# Patient Record
Sex: Female | Born: 1980 | Race: Asian | Hispanic: No | Marital: Single | Smoking: Former smoker
Health system: Southern US, Community
[De-identification: ages and names within clinical notes are randomized; demographics above are authoritative.]

## PROBLEM LIST (undated history)

## (undated) DIAGNOSIS — I639 Cerebral infarction, unspecified: Secondary | ICD-10-CM

## (undated) DIAGNOSIS — D582 Other hemoglobinopathies: Secondary | ICD-10-CM

## (undated) DIAGNOSIS — G43909 Migraine, unspecified, not intractable, without status migrainosus: Secondary | ICD-10-CM

## (undated) DIAGNOSIS — E785 Hyperlipidemia, unspecified: Secondary | ICD-10-CM

## (undated) DIAGNOSIS — D649 Anemia, unspecified: Secondary | ICD-10-CM

## (undated) DIAGNOSIS — I10 Essential (primary) hypertension: Secondary | ICD-10-CM

## (undated) DIAGNOSIS — G4730 Sleep apnea, unspecified: Secondary | ICD-10-CM

## (undated) HISTORY — DX: Hyperlipidemia, unspecified: E78.5

## (undated) HISTORY — DX: Essential (primary) hypertension: I10

## (undated) HISTORY — DX: Other hemoglobinopathies: D58.2

## (undated) HISTORY — DX: Morbid (severe) obesity due to excess calories: E66.01

## (undated) HISTORY — PX: WISDOM TOOTH EXTRACTION: SHX21

## (undated) HISTORY — DX: Cerebral infarction, unspecified: I63.9

## (undated) HISTORY — PX: TUBAL LIGATION: SHX77

## (undated) HISTORY — PX: CHOLECYSTECTOMY: SHX55

---

## 2004-08-12 ENCOUNTER — Emergency Department: Payer: Self-pay | Admitting: Emergency Medicine

## 2006-12-17 ENCOUNTER — Observation Stay: Payer: Self-pay | Admitting: Unknown Physician Specialty

## 2007-06-09 ENCOUNTER — Observation Stay: Payer: Self-pay

## 2007-06-28 ENCOUNTER — Observation Stay: Payer: Self-pay | Admitting: Obstetrics & Gynecology

## 2007-07-19 ENCOUNTER — Inpatient Hospital Stay: Payer: Self-pay | Admitting: Obstetrics & Gynecology

## 2008-04-23 ENCOUNTER — Emergency Department: Payer: Self-pay | Admitting: Emergency Medicine

## 2008-05-04 ENCOUNTER — Emergency Department: Payer: Self-pay | Admitting: Emergency Medicine

## 2015-10-01 ENCOUNTER — Emergency Department: Payer: BLUE CROSS/BLUE SHIELD

## 2015-10-01 ENCOUNTER — Emergency Department
Admission: EM | Admit: 2015-10-01 | Discharge: 2015-10-01 | Disposition: A | Discharge status: Home / Self Care | Payer: BLUE CROSS/BLUE SHIELD | Attending: Emergency Medicine | Admitting: Emergency Medicine

## 2015-10-01 ENCOUNTER — Encounter: Payer: Self-pay | Admitting: Emergency Medicine

## 2015-10-01 DIAGNOSIS — F17200 Nicotine dependence, unspecified, uncomplicated: Secondary | ICD-10-CM | POA: Diagnosis not present

## 2015-10-01 DIAGNOSIS — Y9289 Other specified places as the place of occurrence of the external cause: Secondary | ICD-10-CM | POA: Insufficient documentation

## 2015-10-01 DIAGNOSIS — Y998 Other external cause status: Secondary | ICD-10-CM | POA: Diagnosis not present

## 2015-10-01 DIAGNOSIS — S8991XA Unspecified injury of right lower leg, initial encounter: Secondary | ICD-10-CM | POA: Insufficient documentation

## 2015-10-01 DIAGNOSIS — M25569 Pain in unspecified knee: Secondary | ICD-10-CM

## 2015-10-01 DIAGNOSIS — Y9389 Activity, other specified: Secondary | ICD-10-CM | POA: Diagnosis not present

## 2015-10-01 DIAGNOSIS — X58XXXA Exposure to other specified factors, initial encounter: Secondary | ICD-10-CM | POA: Insufficient documentation

## 2015-10-01 MED ORDER — TRAMADOL HCL 50 MG PO TABS: 50.0000 mg | ORAL_TABLET | Freq: Four times a day (QID) | ORAL | Status: AC | PRN

## 2015-10-01 NOTE — ED Provider Notes (Signed)
[facility] Emergency Department Provider Note    ____________________________________________  Time seen: 2105  I have reviewed the triage vital signs and the nursing notes.   HISTORY  Chief Complaint Knee Pain   History limited by: Not Limited   HPI Natasha Moreno is a 35 y.o. female with no significant past medical history who presents to the emergency department today because of right knee pain. The patient states it started suddenly about 4 hours ago. She was lying in bed and turned over when she felt a pop behind her right knee. The pain has since been constant. She has not noticed any numbness or tingling down her leg. She has not been able to stand because of the pain. She denies any other injuries. Denies injuries to that knee in the past.    History reviewed. No pertinent past medical history.  There are no active problems to display for this patient.   History reviewed. No pertinent past surgical history.  No current outpatient prescriptions on file.  Allergies Review of patient's allergies indicates no known allergies.  History reviewed. No pertinent family history.  Social History Social History  Substance Use Topics  . Smoking status: Current Every Day Smoker  . Smokeless tobacco: None  . Alcohol Use: No    Review of Systems  Constitutional: Negative for fever. Cardiovascular: Negative for chest pain. Respiratory: Negative for shortness of breath. Gastrointestinal: Negative for abdominal pain, vomiting and diarrhea. Neurological: Negative for headaches, focal weakness or numbness.   10-point ROS otherwise negative.  ____________________________________________   PHYSICAL EXAM:  VITAL SIGNS:  98.9 F (37.2 C)   104  18  114/88 mmHg  99 %    Constitutional: Alert and oriented. Well appearing and in no distress. Eyes: Conjunctivae are normal. PERRL. Normal extraocular movements. ENT   Head: Normocephalic and  atraumatic.   Nose: No congestion/rhinnorhea.   Mouth/Throat: Mucous membranes are moist.   Neck: No stridor. Hematological/Lymphatic/Immunilogical: No cervical lymphadenopathy. Cardiovascular: Normal rate, regular rhythm.  No murmurs, rubs, or gallops. Respiratory: Normal respiratory effort without tachypnea nor retractions. Breath sounds are clear and equal bilaterally. No wheezes/rales/rhonchi. Gastrointestinal: Soft and nontender. No distention. There is no CVA tenderness. Genitourinary: Deferred Musculoskeletal: Slightly tender to palpation of the popliteal fossa and some pain with flexion of the knee. No gross deformity. No effusion. No skin change. Neurologic:  Normal speech and language. No gross focal neurologic deficits are appreciated.  Skin:  Skin is warm, dry and intact. No rash noted. Psychiatric: Mood and affect are normal. Speech and behavior are normal. Patient exhibits appropriate insight and judgment.  ____________________________________________    LABS (pertinent positives/negatives)  None  ____________________________________________   EKG  None  ____________________________________________    RADIOLOGY  US  IMPRESSION: Unremarkable sonogram of the right popliteal fossa.   ____________________________________________   PROCEDURES  Procedure(s) performed: None  Critical Care performed: No  ____________________________________________   INITIAL IMPRESSION / ASSESSMENT AND PLAN / ED COURSE  Pertinent labs & imaging results that were available during my care of the patient were reviewed by me and considered in my medical decision making (see chart for details).  Patient presents to the emergency department today because of concerns for right knee pain. This happened when she was in bed. At this point I doubt any osseous injury. Ultrasound did not show any concerning findings. I think likely patient suffered a soft tissue or ligament  and/or tendon sprain. Will have the nurse wrap and Ace bandage. Discussed rice  care with patient. Will give her the follow-up.  ____________________________________________   FINAL CLINICAL IMPRESSION(S) / ED DIAGNOSES  Final diagnoses:  Knee pain     Phineas Semen, MD 10/01/15 2310

## 2015-10-01 NOTE — Discharge Instructions (Signed)
Please seek medical attention for any high fevers, chest pain, shortness of breath, change in behavior, persistent vomiting, bloody stool or any other new or concerning symptoms. ° °RICE for Routine Care of Injuries °The routine care of many injuries includes rest, ice, compression, and elevation (RICE therapy). RICE therapy is often recommended for injuries to soft tissues, such as a muscle strain, ligament injuries, bruises, and overuse injuries. It can also be used for some bony injuries. Using RICE therapy can help to relieve pain, lessen swelling, and enable your body to heal. °Rest °Rest is required to allow your body to heal. This usually involves reducing your normal activities and avoiding use of the injured part of your body. Generally, you can return to your normal activities when you are comfortable and have been given permission by your health care provider. °Ice °Icing your injury helps to keep the swelling down, and it lessens pain. Do not apply ice directly to your skin. °· Put ice in a plastic bag. °· Place a towel between your skin and the bag. °· Leave the ice on for 20 minutes, 2-3 times a day. °Do this for as long as you are directed by your health care provider. °Compression °Compression means putting pressure on the injured area. Compression helps to keep swelling down, gives support, and helps with discomfort. Compression may be done with an elastic bandage. If an elastic bandage has been applied, follow these general tips: °· Remove and reapply the bandage every 3-4 hours or as directed by your health care provider. °· Make sure the bandage is not wrapped too tightly, because this can cut off circulation. If part of your body beyond the bandage becomes blue, numb, cold, swollen, or more painful, your bandage is most likely too tight. If this occurs, remove your bandage and reapply it more loosely. °· See your health care provider if the bandage seems to be making your problems worse rather  than better. °Elevation °Elevation means keeping the injured area raised. This helps to lessen swelling and decrease pain. If possible, your injured area should be elevated at or above the level of your heart or the center of your chest. °WHEN SHOULD I SEEK MEDICAL CARE? °You should seek medical care if: °· Your pain and swelling continue. °· Your symptoms are getting worse rather than improving. °These symptoms may indicate that further evaluation or further X-rays are needed. Sometimes, X-rays may not show a small broken bone (fracture) until a number of days later. Make a follow-up appointment with your health care provider. °WHEN SHOULD I SEEK IMMEDIATE MEDICAL CARE? °You should seek immediate medical care if: °· You have sudden severe pain at or below the area of your injury. °· You have redness or increased swelling around your injury. °· You have tingling or numbness at or below the area of your injury that does not improve after you remove the elastic bandage. °  °This information is not intended to replace advice given to you by your health care provider. Make sure you discuss any questions you have with your health care provider. °  °Document Released: 12/21/2000 Document Revised: 05/30/2015 Document Reviewed: 08/16/2014 °Elsevier Interactive Patient Education ©2016 [employer] °Knee Pain °Knee pain is a very common symptom and can have many causes. Knee pain often goes away when you follow your health care provider's instructions for relieving pain and discomfort at home. However, knee pain can develop into a condition that needs treatment. Some conditions may include: °· Arthritis caused by wear and tear (osteoarthritis). °·   tear (osteoarthritis).  Arthritis caused by swelling and irritation (rheumatoid arthritis or gout).  A cyst or growth in your knee.  An infection in your knee joint.  An injury that will not heal.  Damage, swelling, or irritation of the tissues that support your knee (torn ligaments or  tendinitis). If your knee pain continues, additional tests may be ordered to diagnose your condition. Tests may include X-rays or other imaging studies of your knee. You may also need to have fluid removed from your knee. Treatment for ongoing knee pain depends on the cause, but treatment may include:  Medicines to relieve pain or swelling.  Steroid injections in your knee.  Physical therapy.  Surgery. HOME CARE INSTRUCTIONS  Take medicines only as directed by your health care provider.  Rest your knee and keep it raised (elevated) while you are resting.  Do not do things that cause or worsen pain.  Avoid high-impact activities or exercises, such as running, jumping rope, or doing jumping jacks.  Apply ice to the knee area:  Put ice in a plastic bag.  Place a towel between your skin and the bag.  Leave the ice on for 20 minutes, 2-3 times a day.  Ask your health care provider if you should wear an elastic knee support.  Keep a pillow under your knee when you sleep.  Lose weight if you are overweight. Extra weight can put pressure on your knee.  Do not use any tobacco products, including cigarettes, chewing tobacco, or electronic cigarettes. If you need help quitting, ask your health care provider. Smoking may slow the healing of any bone and joint problems that you may have. SEEK MEDICAL CARE IF:  Your knee pain continues, changes, or gets worse.  You have a fever along with knee pain.  Your knee buckles or locks up.  Your knee becomes more swollen. SEEK IMMEDIATE MEDICAL CARE IF:   Your knee joint feels hot to the touch.  You have chest pain or trouble breathing.   This information is not intended to replace advice given to you by your health care provider. Make sure you discuss any questions you have with your health care provider.   Document Released: 07/06/2007 Document Revised: 09/29/2014 Document Reviewed: 04/24/2014 Elsevier Interactive Patient Education  [employer]

## 2015-10-01 NOTE — ED Notes (Signed)
Assessment per MD

## 2015-10-01 NOTE — ED Notes (Signed)
Pt to ed with c/o right knee pain after bending over in bed.  Pt states she felt a pop behind right knee and then increased pain and difficulty walking.

## 2015-10-05 DIAGNOSIS — S83003A Unspecified subluxation of unspecified patella, initial encounter: Secondary | ICD-10-CM | POA: Insufficient documentation

## 2016-01-05 DIAGNOSIS — L739 Follicular disorder, unspecified: Secondary | ICD-10-CM | POA: Diagnosis not present

## 2016-01-08 DIAGNOSIS — L298 Other pruritus: Secondary | ICD-10-CM | POA: Diagnosis not present

## 2017-03-10 ENCOUNTER — Encounter: Payer: Self-pay | Admitting: Emergency Medicine

## 2017-03-10 ENCOUNTER — Emergency Department
Admission: EM | Admit: 2017-03-10 | Discharge: 2017-03-10 | Disposition: A | Discharge status: Home / Self Care | Payer: BLUE CROSS/BLUE SHIELD | Attending: Emergency Medicine | Admitting: Emergency Medicine

## 2017-03-10 DIAGNOSIS — Y999 Unspecified external cause status: Secondary | ICD-10-CM | POA: Insufficient documentation

## 2017-03-10 DIAGNOSIS — S39012A Strain of muscle, fascia and tendon of lower back, initial encounter: Secondary | ICD-10-CM | POA: Insufficient documentation

## 2017-03-10 DIAGNOSIS — Y929 Unspecified place or not applicable: Secondary | ICD-10-CM | POA: Insufficient documentation

## 2017-03-10 DIAGNOSIS — Y9389 Activity, other specified: Secondary | ICD-10-CM | POA: Insufficient documentation

## 2017-03-10 DIAGNOSIS — M5416 Radiculopathy, lumbar region: Secondary | ICD-10-CM | POA: Insufficient documentation

## 2017-03-10 DIAGNOSIS — F17210 Nicotine dependence, cigarettes, uncomplicated: Secondary | ICD-10-CM | POA: Insufficient documentation

## 2017-03-10 DIAGNOSIS — X501XXA Overexertion from prolonged static or awkward postures, initial encounter: Secondary | ICD-10-CM | POA: Insufficient documentation

## 2017-03-10 MED ORDER — DIAZEPAM 2 MG PO TABS: 2.0000 mg | ORAL_TABLET | Freq: Four times a day (QID) | ORAL | 0 refills | Status: DC | PRN

## 2017-03-10 MED ORDER — LIDOCAINE 5 % EX PTCH
1.0000 | MEDICATED_PATCH | CUTANEOUS | Status: DC
  Administered 2017-03-10: 1 via TRANSDERMAL
  Filled 2017-03-10: qty 1

## 2017-03-10 MED ORDER — KETOROLAC TROMETHAMINE 30 MG/ML IJ SOLN
30.0000 mg | Freq: Once | INTRAMUSCULAR | Status: AC
  Administered 2017-03-10: 30 mg via INTRAVENOUS
  Filled 2017-03-10: qty 1

## 2017-03-10 MED ORDER — TRAMADOL HCL 50 MG PO TABS: 50.0000 mg | ORAL_TABLET | Freq: Four times a day (QID) | ORAL | 0 refills | Status: DC | PRN

## 2017-03-10 MED ORDER — TRAMADOL HCL 50 MG PO TABS
50.0000 mg | ORAL_TABLET | Freq: Once | ORAL | Status: AC
  Administered 2017-03-10: 50 mg via ORAL
  Filled 2017-03-10: qty 1

## 2017-03-10 MED ORDER — LIDOCAINE 5 % EX PTCH: 1.0000 | MEDICATED_PATCH | Freq: Two times a day (BID) | CUTANEOUS | 0 refills | Status: DC

## 2017-03-10 NOTE — Discharge Instructions (Signed)
Please follow up with the acute care clinic. Please take her medication.

## 2017-03-10 NOTE — ED Notes (Signed)
Pt resting in bed, on cell phone, resp even and unlabored, pt in no acute distress

## 2017-03-10 NOTE — ED Provider Notes (Signed)
[facility] Emergency Department Provider Note   ____________________________________________   First MD Initiated Contact with Patient 03/10/17 [phone]     (approximate)  I have reviewed the triage vital signs and the nursing notes.   HISTORY  Chief Complaint Back Pain    HPI Braulio BoschDorothy Lopiccolo is a 36 y.o. female who comes into the hospital today with some back pain. The patient reports that she's been sick with a cough and upper respiratory infection for the past couple of days. The patient has stayed in bed. Reports that when she finally got out of bed she was having some very severe pain to her low back. She reports that if she sits too long or stands for too long her back is painful. The patient has been taking Tylenol for her symptoms. She reports that currently her pain is a 4 out of 10 in intensity. She denies any difficulty urinating or having bowel movements. She has had some mild back pain in the past but reports it has never been this severe. The patient denies any pain with urination. She states that she does have pain to raise her legs. The patient is here today for evaluation. She reports that the pain is across her entire back.   History reviewed. No pertinent past medical history.  There are no active problems to display for this patient.   Past Surgical History:  Procedure Laterality Date  . CHOLECYSTECTOMY    . TUBAL LIGATION    . WISDOM TOOTH EXTRACTION      Prior to Admission medications   Not on File    Allergies Patient has no known allergies.  No family history on file.  Social History Social History  Substance Use Topics  . Smoking status: Current Every Day Smoker    Types: Cigarettes  . Smokeless tobacco: Not on file  . Alcohol use No    Review of Systems  Constitutional: No fever/chills Eyes: No visual changes. ENT: No sore throat. Cardiovascular: Denies chest pain. Respiratory: Denies shortness of  breath. Gastrointestinal: No abdominal pain.  No nausea, no vomiting.  No diarrhea.  No constipation. Genitourinary: Negative for dysuria. Musculoskeletal:  back pain. Skin: Negative for rash. Neurological: Negative for headaches, focal weakness or numbness.   ____________________________________________   PHYSICAL EXAM:  VITAL SIGNS: ED Triage Vitals [03/10/17 0352]  Enc Vitals Group     BP 120/79     Pulse Rate 91     Resp 18     Temp 98.1 F (36.7 C)     Temp Source Oral     SpO2 99 %     Weight 212 lb (96.2 kg)     Height 5' (1.524 m)     Head Circumference      Peak Flow      Pain Score 8     Pain Loc      Pain Edu?      Excl. in GC?     Constitutional: Alert and oriented. Well appearing and in mild distress. Eyes: Conjunctivae are normal. PERRL. EOMI. Head: Atraumatic. Nose: No congestion/rhinnorhea. Mouth/Throat: Mucous membranes are moist.  Oropharynx non-erythematous. Cardiovascular: Normal rate, regular rhythm. Grossly normal heart sounds.  Good peripheral circulation. Respiratory: Normal respiratory effort.  No retractions. Lungs CTAB. Gastrointestinal: Soft and nontender. No distention. Positive bowel sounds Musculoskeletal: Positive straight leg raise bilaterally with some mild tenderness to palpation of the low were back and sacral area. No saddle anesthesia. Neurologic:  Normal speech and language.  Skin:  Skin is warm, dry and intact.  Psychiatric: Mood and affect are normal.   ____________________________________________   LABS (all labs ordered are listed, but only abnormal results are displayed)  Labs Reviewed - No data to display ____________________________________________  EKG  none ____________________________________________  RADIOLOGY  No results found.  ____________________________________________   PROCEDURES  Procedure(s) performed: None  Procedures  Critical Care performed:  No  ____________________________________________   INITIAL IMPRESSION / ASSESSMENT AND PLAN / ED COURSE  Pertinent labs & imaging results that were available during my care of the patient were reviewed by me and considered in my medical decision making (see chart for details).  This is a 36 year old female who comes into the hospital today with some back pain. She's been sick for a few days and stayed in bed. The patient has not had any falls. I did give the patient a shot of Toradol, a Lidoderm patch and some tramadol. I will not do any imaging as the patient has no history of trauma. I will have the patient follow-up with the acute care clinic for further evaluation of her back pain and possible referral to physical therapy. The patient will be discharged.      ____________________________________________   FINAL CLINICAL IMPRESSION(S) / ED DIAGNOSES  Final diagnoses:  Strain of lumbar region, initial encounter  Lumbar radiculopathy      NEW MEDICATIONS STARTED DURING THIS VISIT:  New Prescriptions   No medications on file     Note:  This document was prepared using Dragon voice recognition software and may include unintentional dictation errors.    Rebecka Apley, MD 03/10/17 [phone]

## 2017-03-10 NOTE — ED Triage Notes (Signed)
Pt presents to ED with lower back pain for the past 2 days. Pain increases with movement and ambulation. Pt states she has had an upper resp. Infection for the past weeks. Pt states she has been coughing frequently.

## 2017-11-09 ENCOUNTER — Encounter: Payer: Self-pay | Admitting: Intensive Care

## 2017-11-09 ENCOUNTER — Emergency Department: Payer: BLUE CROSS/BLUE SHIELD

## 2017-11-09 ENCOUNTER — Other Ambulatory Visit: Payer: Self-pay

## 2017-11-09 ENCOUNTER — Observation Stay
Admission: EM | Admit: 2017-11-09 | Discharge: 2017-11-11 | Disposition: A | Discharge status: Home / Self Care | Payer: BLUE CROSS/BLUE SHIELD | Attending: Family Medicine | Admitting: Family Medicine

## 2017-11-09 DIAGNOSIS — Z6841 Body Mass Index (BMI) 40.0 and over, adult: Secondary | ICD-10-CM | POA: Diagnosis not present

## 2017-11-09 DIAGNOSIS — G459 Transient cerebral ischemic attack, unspecified: Secondary | ICD-10-CM | POA: Insufficient documentation

## 2017-11-09 DIAGNOSIS — R29818 Other symptoms and signs involving the nervous system: Secondary | ICD-10-CM | POA: Diagnosis not present

## 2017-11-09 DIAGNOSIS — I639 Cerebral infarction, unspecified: Secondary | ICD-10-CM | POA: Diagnosis not present

## 2017-11-09 DIAGNOSIS — R202 Paresthesia of skin: Secondary | ICD-10-CM | POA: Diagnosis not present

## 2017-11-09 DIAGNOSIS — Z720 Tobacco use: Secondary | ICD-10-CM | POA: Diagnosis not present

## 2017-11-09 DIAGNOSIS — R200 Anesthesia of skin: Principal | ICD-10-CM | POA: Diagnosis present

## 2017-11-09 DIAGNOSIS — E669 Obesity, unspecified: Secondary | ICD-10-CM | POA: Insufficient documentation

## 2017-11-09 DIAGNOSIS — F17210 Nicotine dependence, cigarettes, uncomplicated: Secondary | ICD-10-CM | POA: Diagnosis not present

## 2017-11-09 DIAGNOSIS — R2689 Other abnormalities of gait and mobility: Secondary | ICD-10-CM | POA: Diagnosis not present

## 2017-11-09 LAB — COMPREHENSIVE METABOLIC PANEL
ALT: 33 U/L (ref 14–54)
AST: 28 U/L (ref 15–41)
Albumin: 4.1 g/dL (ref 3.5–5.0)
Alkaline Phosphatase: 54 U/L (ref 38–126)
Anion gap: 9 (ref 5–15)
BUN: 13 mg/dL (ref 6–20)
CHLORIDE: 105 mmol/L (ref 101–111)
CO2: 22 mmol/L (ref 22–32)
CREATININE: 0.76 mg/dL (ref 0.44–1.00)
Calcium: 8.8 mg/dL — ABNORMAL LOW (ref 8.9–10.3)
GFR calc Af Amer: >60 mL/min (ref >60)
Glucose, Bld: 108 mg/dL — ABNORMAL HIGH (ref 65–99)
Potassium: 3.4 mmol/L — ABNORMAL LOW (ref 3.5–5.1)
Sodium: 136 mmol/L (ref 135–145)
Total Bilirubin: 0.9 mg/dL (ref 0.3–1.2)
Total Protein: 7.4 g/dL (ref 6.5–8.1)

## 2017-11-09 LAB — DIFFERENTIAL
BASOS ABS: 0.1 10*3/uL (ref 0–0.1)
BASOS PCT: 1 %
Eosinophils Absolute: 0.2 10*3/uL (ref 0–0.7)
Eosinophils Relative: 2 %
LYMPHS ABS: 2.7 10*3/uL (ref 1.0–3.6)
LYMPHS PCT: 26 %
MONOS PCT: 5 %
Monocytes Absolute: 0.5 10*3/uL (ref 0.2–0.9)
NEUTROS ABS: 6.9 10*3/uL — AB (ref 1.4–6.5)
Neutrophils Relative %: 66 %

## 2017-11-09 LAB — APTT: APTT: 26 s (ref 24–36)

## 2017-11-09 LAB — CBC
HEMATOCRIT: 35 % (ref 35.0–47.0)
HEMOGLOBIN: 10.8 g/dL — AB (ref 12.0–16.0)
MCH: 16 pg — ABNORMAL LOW (ref 26.0–34.0)
MCHC: 30.8 g/dL — ABNORMAL LOW (ref 32.0–36.0)
MCV: 52 fL — ABNORMAL LOW (ref 80.0–100.0)
Platelets: 379 10*3/uL (ref 150–440)
RBC: 6.72 MIL/uL — ABNORMAL HIGH (ref 3.80–5.20)
RDW: 17.6 % — AB (ref 11.5–14.5)
WBC: 10.5 10*3/uL (ref 3.6–11.0)

## 2017-11-09 LAB — GLUCOSE, CAPILLARY: Glucose-Capillary: 115 mg/dL — ABNORMAL HIGH (ref 65–99)

## 2017-11-09 LAB — TROPONIN I

## 2017-11-09 LAB — PROTIME-INR
INR: 1
Prothrombin Time: 13.1 seconds (ref 11.4–15.2)

## 2017-11-09 MED ORDER — ENOXAPARIN SODIUM 40 MG/0.4ML ~~LOC~~ SOLN
40.0000 mg | SUBCUTANEOUS | Status: DC
  Administered 2017-11-10 – 2017-11-11 (×2): 40 mg via SUBCUTANEOUS
  Filled 2017-11-09 (×2): qty 0.4

## 2017-11-09 MED ORDER — ACETAMINOPHEN 160 MG/5ML PO SOLN
650.0000 mg | ORAL | Status: DC | PRN
  Filled 2017-11-09: qty 20.3

## 2017-11-09 MED ORDER — STROKE: EARLY STAGES OF RECOVERY BOOK
Freq: Once | Status: AC
  Administered 2017-11-10: 02:00:00

## 2017-11-09 MED ORDER — ACETAMINOPHEN 650 MG RE SUPP: 650.0000 mg | RECTAL | Status: DC | PRN

## 2017-11-09 MED ORDER — ACETAMINOPHEN 325 MG PO TABS: 650.0000 mg | ORAL_TABLET | ORAL | Status: DC | PRN

## 2017-11-09 MED ORDER — ENOXAPARIN SODIUM 40 MG/0.4ML ~~LOC~~ SOLN
SUBCUTANEOUS | Status: AC
  Filled 2017-11-09: qty 0.4

## 2017-11-09 MED ORDER — ASPIRIN EC 325 MG PO TBEC
DELAYED_RELEASE_TABLET | ORAL | Status: AC
  Filled 2017-11-09: qty 1

## 2017-11-09 MED ORDER — IOPAMIDOL (ISOVUE-370) INJECTION 76%
75.0000 mL | Freq: Once | INTRAVENOUS | Status: AC | PRN
  Administered 2017-11-09: 75 mL via INTRAVENOUS

## 2017-11-09 MED ORDER — ASPIRIN 325 MG PO TABS
325.0000 mg | ORAL_TABLET | Freq: Every day | ORAL | Status: DC
  Administered 2017-11-09 – 2017-11-10 (×2): 325 mg via ORAL
  Filled 2017-11-09: qty 1

## 2017-11-09 MED ORDER — ENOXAPARIN SODIUM 40 MG/0.4ML ~~LOC~~ SOLN
30.0000 mg | SUBCUTANEOUS | Status: DC
  Administered 2017-11-09: 30 mg via SUBCUTANEOUS

## 2017-11-09 NOTE — ED Provider Notes (Addendum)
[facility] Emergency Department Provider Note  \ ____________________________________________   First MD Initiated Contact with Patient 11/09/17 1308     (approximate)  I have reviewed the triage vital signs and the nursing notes.   HISTORY  Chief Complaint Numbness    HPI Natasha Moreno is a 37 y.o. female Who reports numbness on the left side of her body hands and face arms legs etc. starting about 11:00 coming and going and went away when she went to CAT scan is coming back now she has some numbness tingling in the hand there is no motor weakness or incoordination.not had this before. Head CT was negative CT angiogram was negative too. Discussed with Dr. Thad Ranger neurology she feels that the patient still tingling she should be admitted and worked up for stroke. When I see the patient she is having return of the tingling which had gone away and CT scan starting in the hand and working its way out.   History reviewed. No pertinent past medical history.  There are no active problems to display for this patient.   Past Surgical History:  Procedure Laterality Date  . CHOLECYSTECTOMY    . TUBAL LIGATION    . WISDOM TOOTH EXTRACTION      Prior to Admission medications   Medication Sig Start Date End Date Taking? Authorizing Provider  diazepam (VALIUM) 2 MG tablet Take 1 tablet (2 mg total) by mouth every 6 (six) hours as needed for anxiety. Patient not taking: Reported on 11/09/2017 03/10/17   Rebecka Apley, MD  lidocaine (LIDODERM) 5 % Place 1 patch onto the skin every 12 (twelve) hours. Remove & Discard patch within 12 hours or as directed by MD Patient not taking: Reported on 11/09/2017 03/10/17 03/10/18  Rebecka Apley, MD  traMADol (ULTRAM) 50 MG tablet Take 1 tablet (50 mg total) by mouth every 6 (six) hours as needed. Patient not taking: Reported on 11/09/2017 03/10/17   Rebecka Apley, MD    Allergies Patient has no known  allergies.  History reviewed. No pertinent family history.  Social History Social History   Tobacco Use  . Smoking status: Current Every Day Smoker    Types: Cigarettes  . Smokeless tobacco: Never Used  Substance Use Topics  . Alcohol use: No  . Drug use: No    Review of Systems  Constitutional: No fever/chills Eyes: No visual changes. ENT: No sore throat. Cardiovascular: Denies chest pain. Respiratory: Denies shortness of breath. Gastrointestinal: No abdominal pain.  No nausea, no vomiting.  No diarrhea.  No constipation. Genitourinary: Negative for dysuria. Musculoskeletal: Negative for back pain. Skin: Negative for rash. Neurological: Negative for headaches, focal weakness   ____________________________________________   PHYSICAL EXAM:  VITAL SIGNS: ED Triage Vitals  Enc Vitals Group     BP 11/09/17 1244 (!) 125/92     Pulse Rate 11/09/17 1244 99     Resp 11/09/17 1244 16     Temp 11/09/17 1244 98.9 F (37.2 C)     Temp Source 11/09/17 1244 Oral     SpO2 11/09/17 1244 99 %     Weight 11/09/17 1246 212 lb (96.2 kg)     Height --      Head Circumference --      Peak Flow --      Pain Score --      Pain Loc --      Pain Edu? --      Excl. in GC? --  Constitutional: Alert and oriented. Well appearing and in no acute distress. Eyes: Conjunctivae are normal. PER. EOMI. Head: Atraumatic. Nose: No congestion/rhinnorhea. Mouth/Throat: Mucous membranes are moist.  Oropharynx non-erythematous. Neck: No stridor.  Cardiovascular: Normal rate, regular rhythm. Grossly normal heart sounds.  Good peripheral circulation. Respiratory: Normal respiratory effort.  No retractions. Lungs CTAB. Gastrointestinal: Soft and nontender. No distention. No abdominal bruits. No CVA tenderness. Musculoskeletal: No lower extremity tenderness nor edema.  Neurologic:  Normal speech and language. cranial nerves II through XII are intact. Visual fields were not checked cerebellar  finger-nose is normal motor strength is 5 over 5 throughout patient is having some numbness returning starting in the left hand. Skin:  Skin is warm, dry and intact. No rash noted. Psychiatric: Mood and affect are normal. Speech and behavior are normal.  ____________________________________________   LABS (all labs ordered are listed, but only abnormal results are displayed)  Labs Reviewed  CBC - Abnormal; Notable for the following components:      Result Value   RBC 6.72 (*)    Hemoglobin 10.8 (*)    MCV 52.0 (*)    MCH 16.0 (*)    MCHC 30.8 (*)    RDW 17.6 (*)    All other components within normal limits  DIFFERENTIAL - Abnormal; Notable for the following components:   Neutro Abs 6.9 (*)    All other components within normal limits  COMPREHENSIVE METABOLIC PANEL - Abnormal; Notable for the following components:   Potassium 3.4 (*)    Glucose, Bld 108 (*)    Calcium 8.8 (*)    All other components within normal limits  GLUCOSE, CAPILLARY - Abnormal; Notable for the following components:   Glucose-Capillary 115 (*)    All other components within normal limits  PROTIME-INR  APTT  TROPONIN I  CBG MONITORING, ED  POC URINE PREG, ED   ____________________________________________  EKG  EKG read and interpreted by me shows normal sinus rhythm rate of 89 normal axis no acute ST-T wave changes ____________________________________________  RADIOLOGY  ED MD interpretation:  CT of the head and angiogram of head and neck show no acute changes  Official radiology report(s): Ct Angio Head W Or Wo Contrast  Result Date: 11/09/2017 CLINICAL DATA:  37 year old female with code stroke presentation. Symptoms after exercising at the gym. Sudden onset left arm numbness EXAM: CT ANGIOGRAPHY HEAD AND NECK TECHNIQUE: Multidetector CT imaging of the head and neck was performed using the standard protocol during bolus administration of intravenous contrast. Multiplanar CT image  reconstructions and MIPs were obtained to evaluate the vascular anatomy. Carotid stenosis measurements (when applicable) are obtained utilizing NASCET criteria, using the distal internal carotid diameter as the denominator. CONTRAST:  75mL ISOVUE-370 IOPAMIDOL (ISOVUE-370) INJECTION 76% COMPARISON:  Noncontrast head CT 1244 hr today. FINDINGS: CTA NECK Skeleton: Nonspecific reversal of cervical lordosis. No acute osseous abnormality identified. Paranasal sinuses and mastoids are clear. Upper chest: Negative visualized upper chest. Normal superior mediastinum. Other neck: Negative.  No cervical lymphadenopathy. Aortic arch: 3 vessel arch configuration. No arch atherosclerosis or great vessel origin stenosis. Right carotid system: Negative. Left carotid system: Negative. Vertebral arteries: Normal proximal right subclavian artery and right vertebral artery origin. The right vertebral is normal to the skull base. Normal proximal left subclavian artery and left vertebral artery origin. The vertebral arteries are codominant in the neck, and the left is normal to the skull base. CTA HEAD Posterior circulation: Normal distal vertebral arteries and vertebrobasilar junction. The a ICAs appear  dominant. No basilar stenosis. SCA and PCA origins are normal. The right posterior communicating artery is present while the left is diminutive or absent. Bilateral PCA branches are within normal limits. Anterior circulation: Both ICA siphons are patent with no atherosclerosis or stenosis identified. Ophthalmic artery and right posterior communicating artery origins are normal. Patent carotid termini. Normal MCA and ACA origins. Diminutive or absent anterior communicating artery. Bilateral ACA is are within normal limits. Left MCA M1 segment, bifurcation, and left MCA branches are within normal limits. Right MCA M1 segment, bifurcation, and right MCA branches are within normal limits. Venous sinuses: Patent. Anatomic variants: None.  Delayed phase: No abnormal enhancement identified. Stable gray-white matter differentiation throughout the brain. Review of the MIP images confirms the above findings IMPRESSION: 1. Negative CTA head and neck. No large vessel occlusion. No atherosclerosis or stenosis identified. 2. Stable and negative CT appearance of the brain. Electronically Signed   By: Odessa FlemingH  Hall M.D.   On: 11/09/2017 13:53   Ct Angio Neck W Or Wo Contrast  Result Date: 11/09/2017 CLINICAL DATA:  37 year old female with code stroke presentation. Symptoms after exercising at the gym. Sudden onset left arm numbness EXAM: CT ANGIOGRAPHY HEAD AND NECK TECHNIQUE: Multidetector CT imaging of the head and neck was performed using the standard protocol during bolus administration of intravenous contrast. Multiplanar CT image reconstructions and MIPs were obtained to evaluate the vascular anatomy. Carotid stenosis measurements (when applicable) are obtained utilizing NASCET criteria, using the distal internal carotid diameter as the denominator. CONTRAST:  75mL ISOVUE-370 IOPAMIDOL (ISOVUE-370) INJECTION 76% COMPARISON:  Noncontrast head CT 1244 hr today. FINDINGS: CTA NECK Skeleton: Nonspecific reversal of cervical lordosis. No acute osseous abnormality identified. Paranasal sinuses and mastoids are clear. Upper chest: Negative visualized upper chest. Normal superior mediastinum. Other neck: Negative.  No cervical lymphadenopathy. Aortic arch: 3 vessel arch configuration. No arch atherosclerosis or great vessel origin stenosis. Right carotid system: Negative. Left carotid system: Negative. Vertebral arteries: Normal proximal right subclavian artery and right vertebral artery origin. The right vertebral is normal to the skull base. Normal proximal left subclavian artery and left vertebral artery origin. The vertebral arteries are codominant in the neck, and the left is normal to the skull base. CTA HEAD Posterior circulation: Normal distal vertebral  arteries and vertebrobasilar junction. The a ICAs appear dominant. No basilar stenosis. SCA and PCA origins are normal. The right posterior communicating artery is present while the left is diminutive or absent. Bilateral PCA branches are within normal limits. Anterior circulation: Both ICA siphons are patent with no atherosclerosis or stenosis identified. Ophthalmic artery and right posterior communicating artery origins are normal. Patent carotid termini. Normal MCA and ACA origins. Diminutive or absent anterior communicating artery. Bilateral ACA is are within normal limits. Left MCA M1 segment, bifurcation, and left MCA branches are within normal limits. Right MCA M1 segment, bifurcation, and right MCA branches are within normal limits. Venous sinuses: Patent. Anatomic variants: None. Delayed phase: No abnormal enhancement identified. Stable gray-white matter differentiation throughout the brain. Review of the MIP images confirms the above findings IMPRESSION: 1. Negative CTA head and neck. No large vessel occlusion. No atherosclerosis or stenosis identified. 2. Stable and negative CT appearance of the brain. Electronically Signed   By: Odessa FlemingH  Hall M.D.   On: 11/09/2017 13:53   Ct Head Code Stroke Wo Contrast  Result Date: 11/09/2017 CLINICAL DATA:  Code stroke. Paresthesia. Numbness and tingling left side EXAM: CT HEAD WITHOUT CONTRAST TECHNIQUE: Contiguous axial images were  obtained from the base of the skull through the vertex without intravenous contrast. COMPARISON:  None. FINDINGS: Brain: No evidence of acute infarction, hemorrhage, hydrocephalus, extra-axial collection or mass lesion/mass effect. Vascular: Negative for vascular thrombosis Skull: Negative Sinuses/Orbits: Negative Other: None ASPECTS (Alberta Stroke Program Early CT Score) - Ganglionic level infarction (caudate, lentiform nuclei, internal capsule, insula, M1-M3 cortex): 7 - Supraganglionic infarction (M4-M6 cortex): 3 Total score (0-10 with  10 being normal): 10 IMPRESSION: 1. Negative CT head 2. ASPECTS is 10 3. These results were called by telephone at the time of interpretation on 11/09/2017 at 12:58 pm to Dr. Mayford Knife, who verbally acknowledged these results. Electronically Signed   By: Marlan Palau M.D.   On: 11/09/2017 12:58    ____________________________________________   PROCEDURES  Procedure(s) performed:   Procedures  Critical Care performed:   ____________________________________________   INITIAL IMPRESSION / ASSESSMENT AND PLAN / ED COURSE  patient with numbness and tingling of the left side of the body coming and going possibly could be appear sensory stroke or TIA. We'll follow neurology recommendations and admit her to work her up. MRI would be useful. The other thing that it could be would be multiple sclerosis. Patient's the right age.         ____________________________________________   FINAL CLINICAL IMPRESSION(S) / ED DIAGNOSES  Final diagnoses:  Paresthesia  TIA (transient ischemic attack)     ED Discharge Orders    None       Note:  This document was prepared using Dragon voice recognition software and may include unintentional dictation errors.    Arnaldo Natal, MD 11/09/17 1407    Arnaldo Natal, MD 11/09/17 [phone]

## 2017-11-09 NOTE — ED Notes (Signed)
Resumed care from Natasha Moreno. Pt alert.  No numbness now.  No chest pain or sob.  Pt alert.  Speech clear.

## 2017-11-09 NOTE — ED Triage Notes (Signed)
Patient c/o numbness from face to foot on L side since 11:00am. Patient states "It keeps coming and going" bilateral grips equal and strong. No facial droop noted. No slurred speech

## 2017-11-09 NOTE — Consult Note (Addendum)
Referring Physician: Darnelle Catalan    Chief Complaint: Left sided numbness  HPI: Natasha Moreno is an 37 y.o. female presenting with complaints of left sided numbness.  Patient reports awakening today at baseline.  Was at the gym and had acute onset of left sided numbness including face, arm and leg.  Symptoms did not resolve and patient presented for evaluation.  Initial NIHSS of 1.    Date last known well: Date: 11/09/2017 Time last known well: Time: 11:15 tPA Given: No: Minimal symptoms  Past medical history: Miscarriage  Past Surgical History:  Procedure Laterality Date  . CHOLECYSTECTOMY    . TUBAL LIGATION    . WISDOM TOOTH EXTRACTION      Family history: Father with a history of CAD and ESRD.  Mother alive and well with a history of 2 miscarriages.     Social History:  reports that she has been smoking cigarettes.  she has never used smokeless tobacco. She reports that she does not drink alcohol or use drugs.  Allergies: No Known Allergies  Medications: I have reviewed the patient's current medications. Prior to Admission:  Prior to Admission medications   Medication Sig Start Date End Date Taking? Authorizing Provider  diazepam (VALIUM) 2 MG tablet Take 1 tablet (2 mg total) by mouth every 6 (six) hours as needed for anxiety. 03/10/17   Rebecka Apley, MD  lidocaine (LIDODERM) 5 % Place 1 patch onto the skin every 12 (twelve) hours. Remove & Discard patch within 12 hours or as directed by MD 03/10/17 03/10/18  Rebecka Apley, MD  traMADol (ULTRAM) 50 MG tablet Take 1 tablet (50 mg total) by mouth every 6 (six) hours as needed. 03/10/17   Rebecka Apley, MD     ROS: History obtained from the patient  General ROS: negative for - chills, fatigue, fever, night sweats, weight gain or weight loss Psychological ROS: negative for - behavioral disorder, hallucinations, memory difficulties, mood swings or suicidal ideation Ophthalmic ROS: negative for - blurry vision, double  vision, eye pain or loss of vision ENT ROS: negative for - epistaxis, nasal discharge, oral lesions, sore throat, tinnitus or vertigo Allergy and Immunology ROS: negative for - hives or itchy/watery eyes Hematological and Lymphatic ROS: negative for - bleeding problems, bruising or swollen lymph nodes Endocrine ROS: negative for - galactorrhea, hair pattern changes, polydipsia/polyuria or temperature intolerance Respiratory ROS: negative for - cough, hemoptysis, shortness of breath or wheezing Cardiovascular ROS: negative for - chest pain, dyspnea on exertion, edema or irregular heartbeat Gastrointestinal ROS: negative for - abdominal pain, diarrhea, hematemesis, nausea/vomiting or stool incontinence Genito-Urinary ROS: negative for - dysuria, hematuria, incontinence or urinary frequency/urgency Musculoskeletal ROS: negative for - joint swelling or muscular weakness Neurological ROS: as noted in HPI Dermatological ROS: negative for rash and skin lesion changes  Physical Examination: Blood pressure (!) 125/92, pulse 99, temperature 98.9 F (37.2 C), temperature source Oral, resp. rate 16, weight 96.2 kg (212 lb), SpO2 99 %.  HEENT-  Normocephalic, no lesions, without obvious abnormality.  Normal external eye and conjunctiva.  Normal TM's bilaterally.  Normal auditory canals and external ears. Normal external nose, mucus membranes and septum.  Normal pharynx. Cardiovascular- S1, S2 normal, pulses palpable throughout   Lungs- chest clear, no wheezing, rales, normal symmetric air entry Abdomen- soft, non-tender; bowel sounds normal; no masses,  no organomegaly Extremities- no edema Lymph-no adenopathy palpable Musculoskeletal-no joint tenderness, deformity or swelling Skin-warm and dry, no hyperpigmentation, vitiligo, or suspicious lesions  Neurological Examination  Mental Status: Alert, oriented, thought content appropriate.  Speech fluent without evidence of aphasia.  Able to follow 3 step  commands without difficulty. Cranial Nerves: II: Discs flat bilaterally; Visual fields grossly normal, pupils equal, round, reactive to light and accommodation III,IV, VI: ptosis not present, extra-ocular motions intact bilaterally V,VII: smile symmetric, facial light touch sensation decreased on the left VIII: hearing normal bilaterally IX,X: gag reflex present XI: bilateral shoulder shrug XII: midline tongue extension Motor: Right : Upper extremity   5/5    Left:     Upper extremity   5/5  Lower extremity   5/5     Lower extremity   5/5 Tone and bulk:normal tone throughout; no atrophy noted Sensory: Pinprick and light touch decreased in the left upper and lower extremity Deep Tendon Reflexes: 2+ and symmetric throughout Plantars: Right: equivocal   Left: equivocal Cerebellar: Normal finger-to-nose and normal heel-to-shin testing bilaterally Gait: not tested due to safety concerns    Laboratory Studies:  Basic Metabolic Panel: Recent Labs  Lab 11/09/17 1235  NA 136  K 3.4*  CL 105  CO2 22  GLUCOSE 108*  BUN 13  CREATININE 0.76  CALCIUM 8.8*    Liver Function Tests: Recent Labs  Lab 11/09/17 1235  AST 28  ALT 33  ALKPHOS 54  BILITOT 0.9  PROT 7.4  ALBUMIN 4.1   No results for input(s): LIPASE, AMYLASE in the last 168 hours. No results for input(s): AMMONIA in the last 168 hours.  CBC: Recent Labs  Lab 11/09/17 1235  WBC 10.5  NEUTROABS 6.9*  HGB 10.8*  HCT 35.0  MCV 52.0*  PLT PENDING    Cardiac Enzymes: Recent Labs  Lab 11/09/17 1235  TROPONINI <0.03    BNP: Invalid input(s): POCBNP  CBG: No results for input(s): GLUCAP in the last 168 hours.  Microbiology: No results found for this or any previous visit.  Coagulation Studies: Recent Labs    11/09/17 1235  LABPROT 13.1  INR 1.00    Urinalysis: No results for input(s): COLORURINE, LABSPEC, PHURINE, GLUCOSEU, HGBUR, BILIRUBINUR, KETONESUR, PROTEINUR, UROBILINOGEN, NITRITE,  LEUKOCYTESUR in the last 168 hours.  Invalid input(s): APPERANCEUR  Lipid Panel: No results found for: CHOL, TRIG, HDL, CHOLHDL, VLDL, LDLCALC  HgbA1C: No results found for: HGBA1C  Urine Drug Screen:  No results found for: LABOPIA, COCAINSCRNUR, LABBENZ, AMPHETMU, THCU, LABBARB  Alcohol Level: No results for input(s): ETH in the last 168 hours.   Imaging: Ct Head Code Stroke Wo Contrast  Result Date: 11/09/2017 CLINICAL DATA:  Code stroke. Paresthesia. Numbness and tingling left side EXAM: CT HEAD WITHOUT CONTRAST TECHNIQUE: Contiguous axial images were obtained from the base of the skull through the vertex without intravenous contrast. COMPARISON:  None. FINDINGS: Brain: No evidence of acute infarction, hemorrhage, hydrocephalus, extra-axial collection or mass lesion/mass effect. Vascular: Negative for vascular thrombosis Skull: Negative Sinuses/Orbits: Negative Other: None ASPECTS (Alberta Stroke Program Early CT Score) - Ganglionic level infarction (caudate, lentiform nuclei, internal capsule, insula, M1-M3 cortex): 7 - Supraganglionic infarction (M4-M6 cortex): 3 Total score (0-10 with 10 being normal): 10 IMPRESSION: 1. Negative CT head 2. ASPECTS is 10 3. These results were called by telephone at the time of interpretation on 11/09/2017 at 12:58 pm to Dr. Mayford KnifeWilliams, who verbally acknowledged these results. Electronically Signed   By: Marlan Palauharles  Clark M.D.   On: 11/09/2017 12:58    Assessment: 37 y.o. female presenting with complaints of left sided numbness that started while working out.  Head CT reviewed  and shows no acute changes.  Due to onset with exercise can not rule out the possibility of dissection or small embolic infarct related to shunt.  Further work up recommended.  Patient on no antiplatelet therapy prior to presentation.    Stroke Risk Factors - smoking  Plan: 1. HgbA1c, fasting lipid panel 2. MRI of the brain without contrast 3. PT consult, OT consult, Speech consult 4.  Echocardiogram 5. Carotid dopplers 6. Prophylactic therapy-Antiplatelet med: Aspirin - dose 325mg  daily 7. NPO until RN stroke swallow screen 8. Telemetry monitoring 9. Frequent neuro checks 10. CTA of the head and neck 11. Smoking cessation counseling  Case discussed with Dr. Darnelle Catalan.    Thana Farr, MD Neurology [phone] 11/09/2017, 1:09 PM  Addendum: CTA reviewed and unremarkable.  Would benefit form admission and completion of work up as detailed above if symptoms persist.      Thana Farr, MD Neurology [phone]

## 2017-11-09 NOTE — H&P (Signed)
[facility] [place] at [facility]   PATIENT NAME: Natasha Moreno    MR#:  [id number]  DATE OF BIRTH:  06/06/1981  DATE OF ADMISSION:  11/09/2017  PRIMARY [facility] PH[facility]IC[facility] [place]   REQUESTING/REFERRING PHYSICIAN: Dr. Dorothea GlassmanPaul Malinda  CHIEF COMPLAINT: Left-sided numbness   Chief Complaint  Patient presents with  . Numbness    HISTORY OF PRESENT ILLNESS:  Natasha BoschDorothy Grine  is a 37 y.o. female with past medical history started to have left-sided numbness including left side of the face when she was at the gym around 1130 this morning.  Did not have slurred speech.  No head headache.  No weakness on the left side.  No facial droop.  Initial stroke scale.  Seen by Dr. Thad Rangereynolds from neurology recommended admission and full stroke workup.  Patient CT Angio of head and neck unremarkable.  He also passed a swallow screen.  PAST MEDICAL HISTORY:  History reviewed. No pertinent past medical history.  PAST SURGICAL HISTOIRY:   Past Surgical History:  Procedure Laterality Date  . CHOLECYSTECTOMY    . TUBAL LIGATION    . WISDOM TOOTH EXTRACTION      SOCIAL HISTORY:   Social History   Tobacco Use  . Smoking status: Current Every Day Smoker    Types: Cigarettes  . Smokeless tobacco: Never Used  Substance Use Topics  . Alcohol use: No    FAMILY HISTORY:  History reviewed. No pertinent family history.  DRUG ALLERGIES:  No Known Allergies  REVIEW OF SYSTEMS:  CONSTITUTIONAL: No fever, fatigue or weakness. Obese . EYES: No blurred or double vision.  EARS, NOSE, AND THROAT: No tinnitus or ear pain.  RESPIRATORY: No cough, shortness of breath, wheezing or hemoptysis.  CARDIOVASCULAR: No chest pain, orthopnea, edema.  GASTROINTESTINAL: No nausea, vomiting, diarrhea or abdominal pain.  GENITOURINARY: No dysuria, hematuria.  ENDOCRINE: No polyuria, nocturia,  HEMATOLOGY: No anemia, easy bruising or bleeding SKIN: No rash or  lesion. MUSCULOSKELETAL: No joint pain or arthritis.   NEUROLOGIC: Left-sided numbness without any weakness.  Cranial nerves II through XII intact.  Power is 5/5 bilaterally upper and lower extremities. PSYCHIATRY: No anxiety or depression.   MEDICATIONS AT HOME:   Prior to Admission medications   Medication Sig Start Date End Date Taking? Authorizing Provider  diazepam (VALIUM) 2 MG tablet Take 1 tablet (2 mg total) by mouth every 6 (six) hours as needed for anxiety. Patient not taking: Reported on 11/09/2017 03/10/17   Rebecka ApleyWebster, Allison P, MD  lidocaine (LIDODERM) 5 % Place 1 patch onto the skin every 12 (twelve) hours. Remove & Discard patch within 12 hours or as directed by MD Patient not taking: Reported on 11/09/2017 03/10/17 03/10/18  Rebecka ApleyWebster, Allison P, MD  traMADol (ULTRAM) 50 MG tablet Take 1 tablet (50 mg total) by mouth every 6 (six) hours as needed. Patient not taking: Reported on 11/09/2017 03/10/17   Rebecka ApleyWebster, Allison P, MD      VITAL SIGNS:  Blood pressure (!) 125/92, pulse 99, temperature 98.9 F (37.2 C), temperature source Oral, resp. rate 16, weight 96.2 kg (212 lb), SpO2 99 %.  PHYSICAL EXAMINATION:  GENERAL:  37 y.o.-year-old patient lying in the bed with no acute distress.  EYES: Pupils equal, round, reactive to light and accommodation. No scleral icterus. Extraocular muscles intact.  HEENT: Head atraumatic, normocephalic. Oropharynx and nasopharynx clear.  NECK:  Supple, no jugular venous distention. No thyroid enlargement, no tenderness.  LUNGS: Normal breath sounds bilaterally,  no wheezing, rales,rhonchi or crepitation. No use of accessory muscles of respiration.  CARDIOVASCULAR: S1, S2 normal. No murmurs, rubs, or gallops.  ABDOMEN: Soft, nontender, nondistended. Bowel sounds present. No organomegaly or mass.  EXTREMITIES: No pedal edema, cyanosis, or clubbing.  NEUROLOGIC: Cranial nerves II through XII are intact. Muscle strength 5/5 in all extremities. Sensation  intact. Gait not checked.  PSYCHIATRIC: The patient is alert and oriented x 3.  SKIN: No obvious rash, lesion, or ulcer.   LABORATORY PANEL:   CBC Recent Labs  Lab 11/09/17 1235  WBC 10.5  HGB 10.8*  HCT 35.0  PLT 379   ------------------------------------------------------------------------------------------------------------------  Chemistries  Recent Labs  Lab 11/09/17 1235  NA 136  K 3.4*  CL 105  CO2 22  GLUCOSE 108*  BUN 13  CREATININE 0.76  CALCIUM 8.8*  AST 28  ALT 33  ALKPHOS 54  BILITOT 0.9   ------------------------------------------------------------------------------------------------------------------  Cardiac Enzymes Recent Labs  Lab 11/09/17 1235  TROPONINI <0.03   ------------------------------------------------------------------------------------------------------------------  RADIOLOGY:  Ct Angio Head W Or Wo Contrast  Result Date: 11/09/2017 CLINICAL DATA:  37 year old female with code stroke presentation. Symptoms after exercising at the gym. Sudden onset left arm numbness EXAM: CT ANGIOGRAPHY HEAD AND NECK TECHNIQUE: Multidetector CT imaging of the head and neck was performed using the standard protocol during bolus administration of intravenous contrast. Multiplanar CT image reconstructions and MIPs were obtained to evaluate the vascular anatomy. Carotid stenosis measurements (when applicable) are obtained utilizing NASCET criteria, using the distal internal carotid diameter as the denominator. CONTRAST:  75mL ISOVUE-370 IOPAMIDOL (ISOVUE-370) INJECTION 76% COMPARISON:  Noncontrast head CT 1244 hr today. FINDINGS: CTA NECK Skeleton: Nonspecific reversal of cervical lordosis. No acute osseous abnormality identified. Paranasal sinuses and mastoids are clear. Upper chest: Negative visualized upper chest. Normal superior mediastinum. Other neck: Negative.  No cervical lymphadenopathy. Aortic arch: 3 vessel arch configuration. No arch atherosclerosis  or great vessel origin stenosis. Right carotid system: Negative. Left carotid system: Negative. Vertebral arteries: Normal proximal right subclavian artery and right vertebral artery origin. The right vertebral is normal to the skull base. Normal proximal left subclavian artery and left vertebral artery origin. The vertebral arteries are codominant in the neck, and the left is normal to the skull base. CTA HEAD Posterior circulation: Normal distal vertebral arteries and vertebrobasilar junction. The a ICAs appear dominant. No basilar stenosis. SCA and PCA origins are normal. The right posterior communicating artery is present while the left is diminutive or absent. Bilateral PCA branches are within normal limits. Anterior circulation: Both ICA siphons are patent with no atherosclerosis or stenosis identified. Ophthalmic artery and right posterior communicating artery origins are normal. Patent carotid termini. Normal MCA and ACA origins. Diminutive or absent anterior communicating artery. Bilateral ACA is are within normal limits. Left MCA M1 segment, bifurcation, and left MCA branches are within normal limits. Right MCA M1 segment, bifurcation, and right MCA branches are within normal limits. Venous sinuses: Patent. Anatomic variants: None. Delayed phase: No abnormal enhancement identified. Stable gray-white matter differentiation throughout the brain. Review of the MIP images confirms the above findings IMPRESSION: 1. Negative CTA head and neck. No large vessel occlusion. No atherosclerosis or stenosis identified. 2. Stable and negative CT appearance of the brain. Electronically Signed   By: Odessa Fleming M.D.   On: 11/09/2017 13:53   Ct Angio Neck W Or Wo Contrast  Result Date: 11/09/2017 CLINICAL DATA:  37 year old female with code stroke presentation. Symptoms after exercising at the gym. Sudden  onset left arm numbness EXAM: CT ANGIOGRAPHY HEAD AND NECK TECHNIQUE: Multidetector CT imaging of the head and neck was  performed using the standard protocol during bolus administration of intravenous contrast. Multiplanar CT image reconstructions and MIPs were obtained to evaluate the vascular anatomy. Carotid stenosis measurements (when applicable) are obtained utilizing NASCET criteria, using the distal internal carotid diameter as the denominator. CONTRAST:  75mL ISOVUE-370 IOPAMIDOL (ISOVUE-370) INJECTION 76% COMPARISON:  Noncontrast head CT 1244 hr today. FINDINGS: CTA NECK Skeleton: Nonspecific reversal of cervical lordosis. No acute osseous abnormality identified. Paranasal sinuses and mastoids are clear. Upper chest: Negative visualized upper chest. Normal superior mediastinum. Other neck: Negative.  No cervical lymphadenopathy. Aortic arch: 3 vessel arch configuration. No arch atherosclerosis or great vessel origin stenosis. Right carotid system: Negative. Left carotid system: Negative. Vertebral arteries: Normal proximal right subclavian artery and right vertebral artery origin. The right vertebral is normal to the skull base. Normal proximal left subclavian artery and left vertebral artery origin. The vertebral arteries are codominant in the neck, and the left is normal to the skull base. CTA HEAD Posterior circulation: Normal distal vertebral arteries and vertebrobasilar junction. The a ICAs appear dominant. No basilar stenosis. SCA and PCA origins are normal. The right posterior communicating artery is present while the left is diminutive or absent. Bilateral PCA branches are within normal limits. Anterior circulation: Both ICA siphons are patent with no atherosclerosis or stenosis identified. Ophthalmic artery and right posterior communicating artery origins are normal. Patent carotid termini. Normal MCA and ACA origins. Diminutive or absent anterior communicating artery. Bilateral ACA is are within normal limits. Left MCA M1 segment, bifurcation, and left MCA branches are within normal limits. Right MCA M1 segment,  bifurcation, and right MCA branches are within normal limits. Venous sinuses: Patent. Anatomic variants: None. Delayed phase: No abnormal enhancement identified. Stable gray-white matter differentiation throughout the brain. Review of the MIP images confirms the above findings IMPRESSION: 1. Negative CTA head and neck. No large vessel occlusion. No atherosclerosis or stenosis identified. 2. Stable and negative CT appearance of the brain. Electronically Signed   By: Odessa Fleming M.D.   On: 11/09/2017 13:53   Ct Head Code Stroke Wo Contrast  Result Date: 11/09/2017 CLINICAL DATA:  Code stroke. Paresthesia. Numbness and tingling left side EXAM: CT HEAD WITHOUT CONTRAST TECHNIQUE: Contiguous axial images were obtained from the base of the skull through the vertex without intravenous contrast. COMPARISON:  None. FINDINGS: Brain: No evidence of acute infarction, hemorrhage, hydrocephalus, extra-axial collection or mass lesion/mass effect. Vascular: Negative for vascular thrombosis Skull: Negative Sinuses/Orbits: Negative Other: None ASPECTS (Alberta Stroke Program Early CT Score) - Ganglionic level infarction (caudate, lentiform nuclei, internal capsule, insula, M1-M3 cortex): 7 - Supraganglionic infarction (M4-M6 cortex): 3 Total score (0-10 with 10 being normal): 10 IMPRESSION: 1. Negative CT head 2. ASPECTS is 10 3. These results were called by telephone at the time of interpretation on 11/09/2017 at 12:58 pm to Dr. Mayford Knife, who verbally acknowledged these results. Electronically Signed   By: Marlan Palau M.D.   On: 11/09/2017 12:58    EKG:   Orders placed or performed during the hospital encounter of 11/09/17  . ED EKG  . ED EKG    IMPRESSION AND PLAN:  37 year old obese female with left-sided numbness which is not resolved, seen by neurologist recommended admission for stroke workup.  Admit to stroke unit, check fasting lipids, hemoglobin A1c, PT OT Consults, echocardiogram, start prophylactic aspirin 325  mg p.o. daily also check  frequent neuro checks.  CT Angie of head and neck unremarkable. 2.. Tobacco abuse:advise patient to quit smoking   All the records are reviewed and case discussed with ED provider. Management plans discussed with the patient, family and they are in agreement.  CODE STATUS:full  TOTAL TIME TAKING CARE OF THIS PATIENT: .    Katha Hamming M.D on 11/09/2017 at 2:48 PM  Between 7am to 6pm - Pager - [phone]  After 6pm go to [email or web address] - password [email or web address] [facility]  [place] [facility]  Office  [phone]  CC: Primary care physician; [facility], [place]  Note: This dictation was prepared with Dragon dictation along with smaller phrase technology. Any transcriptional errors that result from this process are unintentional.

## 2017-11-09 NOTE — ED Notes (Signed)
Left sided numbness/ tingling x1.5 hours. Pt able to lift left arm with no complications, speaking in clear full sentences.

## 2017-11-09 NOTE — Code Documentation (Signed)
Pt arrives via POV, per pt she had just finished working out at the gym when she had sudden onset of left arm side numbness, code stroke activated in triage, pt taken from triage to CT for non-contrast head CT then room 14, initial NIHSS 1, no tPA given due to low NIHSS and non-debilitating symptoms, CTA ordered by Dr. Thad Rangereynolds, see stroke documentation for times, [facility]

## 2017-11-09 NOTE — ED Notes (Signed)
Report called to brandy rn floor nurse 

## 2017-11-09 NOTE — ED Notes (Signed)
Patient transported to CT 

## 2017-11-09 NOTE — Progress Notes (Signed)
Pharmacist - Prescriber Communication  Enoxaparin dose modified from 30 mg subcutaneously once daily to 40 mg subcutaneously once daily due to CrCl greater than 30 mL/min and ABW greater than 45 kg.  Karn Derk A. Angusookson, Vermont[place]arm.D., BCPS Clinical Pharmacist 11/09/2017 16:21

## 2017-11-10 ENCOUNTER — Observation Stay: Payer: BLUE CROSS/BLUE SHIELD

## 2017-11-10 ENCOUNTER — Observation Stay
Admit: 2017-11-10 | Discharge: 2017-11-10 | Disposition: A | Discharge status: Home / Self Care | Payer: BLUE CROSS/BLUE SHIELD | Attending: Internal Medicine | Admitting: Internal Medicine

## 2017-11-10 DIAGNOSIS — R200 Anesthesia of skin: Secondary | ICD-10-CM | POA: Diagnosis not present

## 2017-11-10 DIAGNOSIS — I639 Cerebral infarction, unspecified: Secondary | ICD-10-CM | POA: Diagnosis not present

## 2017-11-10 DIAGNOSIS — I6389 Other cerebral infarction: Secondary | ICD-10-CM | POA: Diagnosis not present

## 2017-11-10 DIAGNOSIS — E669 Obesity, unspecified: Secondary | ICD-10-CM | POA: Diagnosis not present

## 2017-11-10 DIAGNOSIS — R202 Paresthesia of skin: Secondary | ICD-10-CM

## 2017-11-10 DIAGNOSIS — Z720 Tobacco use: Secondary | ICD-10-CM | POA: Diagnosis not present

## 2017-11-10 LAB — HEMOGLOBIN A1C
HEMOGLOBIN A1C: 4.5 % — AB (ref 4.8–5.6)
MEAN PLASMA GLUCOSE: 82.45 mg/dL

## 2017-11-10 LAB — LIPID PANEL
Cholesterol: 109 mg/dL (ref 0–200)
HDL: 47 mg/dL (ref >40)
LDL Cholesterol: 49 mg/dL (ref 0–99)
TRIGLYCERIDES: 63 mg/dL (ref <150)
Total CHOL/HDL Ratio: 2.3 RATIO
VLDL: 13 mg/dL (ref 0–40)

## 2017-11-10 LAB — URINE DRUG SCREEN, QUALITATIVE (ARMC ONLY)
Amphetamines, Ur Screen: NOT DETECTED
BARBITURATES, UR SCREEN: NOT DETECTED
Benzodiazepine, Ur Scrn: NOT DETECTED
CANNABINOID 50 NG, UR ~~LOC~~: NOT DETECTED
COCAINE METABOLITE, UR ~~LOC~~: NOT DETECTED
MDMA (Ecstasy)Ur Screen: NOT DETECTED
METHADONE SCREEN, URINE: NOT DETECTED
Opiate, Ur Screen: NOT DETECTED
Phencyclidine (PCP) Ur S: NOT DETECTED
TRICYCLIC, UR SCREEN: NOT DETECTED

## 2017-11-10 LAB — SEDIMENTATION RATE: SED RATE: 8 mm/h (ref 0–20)

## 2017-11-10 LAB — TSH: TSH: 2.121 u[IU]/mL (ref 0.350–4.500)

## 2017-11-10 LAB — VITAMIN B12: Vitamin B-12: 283 pg/mL (ref 180–914)

## 2017-11-10 LAB — ECHOCARDIOGRAM COMPLETE
Height: 60 in
Weight: 3376 oz

## 2017-11-10 LAB — ANTITHROMBIN III: ANTITHROMB III FUNC: 100 % (ref 75–120)

## 2017-11-10 MED ORDER — POTASSIUM CHLORIDE CRYS ER 20 MEQ PO TBCR
20.0000 meq | EXTENDED_RELEASE_TABLET | Freq: Once | ORAL | Status: AC
  Administered 2017-11-10: 20 meq via ORAL
  Filled 2017-11-10: qty 1

## 2017-11-10 MED ORDER — ATORVASTATIN CALCIUM 20 MG PO TABS
40.0000 mg | ORAL_TABLET | Freq: Every day | ORAL | Status: DC
  Administered 2017-11-10: 40 mg via ORAL
  Filled 2017-11-10: qty 2

## 2017-11-10 NOTE — Progress Notes (Signed)
*  PRELIMINARY RESULTS* Echocardiogram 2D Echocardiogram has been performed.  Natasha GulaJoan M Estel Moreno 11/10/2017, 9:20 AM

## 2017-11-10 NOTE — Evaluation (Signed)
Occupational Therapy Evaluation Patient Details Name: Natasha BoschDorothy Moreno MRN: [id number] DOB: 16-Feb-1981 Today's Date: 11/10/2017    History of Present Illness 37 y.o. female pt started to have left-sided numbness including left side of the face when she was at the gym around 1130 this morning.  No slurred speech, HA, L side weakness, or facial droop. 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    Clinical Impression   Pt seen for OT evaluation this date. 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 Pt lives at home with mother, brother, and two adolescent children. Pt has been going to the gym with a trainer. Currently, pt presents with very mild strength/coordination deficits in LUE (4+/5) with testing and intermittent numbness/tingling in L hand. LUE + mild pronator drift. Pt modified independent with ADL tasks, with pt noting increased effort to perform tasks. No balance deficits noted with mobility. Pt instructed in fine motor coordination exercises to perform at home with pt able to demo understanding. Encouraged pt to speak with MD regarding when she can safely return to work/gym/driving. No additional OT needs at this time. Will sign off. Please re-consult if additional needs arise.     Follow Up Recommendations  No OT follow up    Equipment Recommendations  None recommended by OT    Recommendations for Other Services       Precautions / Restrictions Precautions Precautions: None Restrictions Weight Bearing Restrictions: No      Mobility Bed Mobility Overal bed mobility: Independent                Transfers Overall transfer level: Independent                    Balance Overall balance assessment: No apparent balance deficits (not formally assessed)                                         ADL either performed or assessed with clinical  judgement   ADL Overall ADL's : Modified independent                                       General ADL Comments: Pt able to perform all basic ADL tasks with modified independence, with pt noting additional effort using L hand in tasks.     Vision Baseline Vision/History: Wears glasses Wears Glasses: At all times Patient Visual Report: No change from baseline Vision Assessment?: No apparent visual deficits     Perception     Praxis      Pertinent Vitals/Pain Pain Assessment: No/denies pain     Hand Dominance Right   Extremity/Trunk Assessment Upper Extremity Assessment Upper Extremity Assessment: LUE deficits/detail LUE Deficits / Details: very mild strength/coordination deficits with finger to nose, RAM, and thumb opposition testing, LUE shoulder flex/elbow flex/ext 4+5, grip WNL, intermittent mild impairment in L hand sensation (improved since start of symptoms with significant whole L side numbness) LUE Sensation: decreased light touch LUE Coordination: decreased fine motor   Lower Extremity Assessment Lower Extremity Assessment: Overall WFL for tasks assessed   Cervical / Trunk Assessment Cervical / Trunk Assessment: Normal   Communication Communication Communication: No difficulties   Cognition Arousal/Alertness: Awake/alert Behavior During  Therapy: WFL for tasks assessed/performed Overall Cognitive Status: Within Functional Limits for tasks assessed                                     General Comments  pt endorses numbness to L lateral aspect of chin/jaw that is consistent    Exercises Other Exercises Other Exercises: Pt instructed in fine motor coordination exercises to perform with LUE/L hand with pt able to demo understanding.    Shoulder Instructions      Home Living Family/patient expects to be discharged to:: Private residence Living Arrangements: Other relatives;Children;Parent(pt's mother and brother live with pt as  well as 10yo and 15yo children) Available Help at Discharge: Family Type of Home: House Home Access: Stairs to enter [employer] of Steps: 4 Entrance Stairs-Rails: Can reach both Home Layout: One level     Bathroom Shower/Tub: Walk-in shower;Tub/shower unit   Bathroom Toilet: Standard     Home Equipment: None          Prior Functioning/Environment Level of Independence: Independent        Comments: Pt indep at baseline, working full time, driving, has a Psychologist, educational at the gym        OT Problem List:        OT Treatment/Interventions:      OT Goals(Current goals can be found in the care plan section) Acute Rehab OT Goals Patient Stated Goal: go home and return to PLOF OT Goal Formulation: All assessment and education complete, DC therapy  OT Frequency:     Barriers to D/C:            Co-evaluation              AM-PAC PT "6 Clicks" Daily Activity     Outcome Measure Help from another person eating meals?: None Help from another person taking care of personal grooming?: None Help from another person toileting, which includes using toliet, bedpan, or urinal?: None Help from another person bathing (including washing, rinsing, drying)?: None Help from another person to put on and taking off regular upper body clothing?: None Help from another person to put on and taking off regular lower body clothing?: None 6 Click Score: 24   End of Session    Activity Tolerance: Patient tolerated treatment well Patient left: in bed;with call bell/phone within reach  OT Visit Diagnosis: Other abnormalities of gait and mobility (R26.89)                Time: 1455-1516 OT Time Calculation (min): 21 min Charges:  OT General Charges $OT Visit: 1 Visit OT Evaluation $OT Eval Low Complexity: 1 Low OT Treatments $Therapeutic Exercise: 8-22 mins  Richrd Prime, MPH, MS, OTR/L ascom [phone] 11/10/17, 3:42 PM

## 2017-11-10 NOTE — Plan of Care (Signed)
  Progressing Education: Knowledge of General Education information will improve 11/10/2017 0518 - Progressing by Peterson LombardKlenner, Benjamin Merrihew C, RN Health Behavior/Discharge Planning: Ability to manage health-related needs will improve 11/10/2017 0518 - Progressing by Peterson LombardKlenner, Mckinze Poirier C, RN Clinical Measurements: Ability to maintain clinical measurements within normal limits will improve 11/10/2017 0518 - Progressing by Peterson LombardKlenner, Zurii Hewes C, RN Will remain free from infection 11/10/2017 0518 - Progressing by Peterson LombardKlenner, Taj Nevins C, RN Diagnostic test results will improve 11/10/2017 0518 - Progressing by Peterson LombardKlenner, Adriyana Greenbaum C, RN Respiratory complications will improve 11/10/2017 0518 - Progressing by Peterson LombardKlenner, Gurfateh Mcclain C, RN Cardiovascular complication will be avoided 11/10/2017 0518 - Progressing by Peterson LombardKlenner, Khalia Gong C, RN Activity: Risk for activity intolerance will decrease 11/10/2017 0518 - Progressing by Peterson LombardKlenner, Ardath Lepak C, RN Nutrition: Adequate nutrition will be maintained 11/10/2017 0518 - Progressing by Peterson LombardKlenner, Darwin Guastella C, RN Coping: Level of anxiety will decrease 11/10/2017 0518 - Progressing by Peterson LombardKlenner, Jansen Sciuto C, RN Elimination: Will not experience complications related to bowel motility 11/10/2017 0518 - Progressing by Peterson LombardKlenner, Shar Paez C, RN Will not experience complications related to urinary retention 11/10/2017 0518 - Progressing by Peterson LombardKlenner, Wilburt Messina C, RN Pain Managment: General experience of comfort will improve 11/10/2017 0518 - Progressing by Peterson LombardKlenner, Miarose Lippert C, RN Safety: Ability to remain free from injury will improve 11/10/2017 0518 - Progressing by Peterson LombardKlenner, Jaedan Schuman C, RN Skin Integrity: Risk for impaired skin integrity will decrease 11/10/2017 0518 - Progressing by Peterson LombardKlenner, Murrel Freet C, RN

## 2017-11-10 NOTE — Progress Notes (Addendum)
Subjective: Patient reports that her left sided paresthesias have been intermittent since admission.    Objective: Current vital signs: BP 101/67 (BP Location: Right Arm)   Pulse 73   Temp 98.6 F (37 C) (Oral)   Resp 20   Ht 5' (1.524 m)   Wt 95.7 kg (211 lb)   SpO2 99%   BMI 41.21 kg/m  Vital signs in last 24 hours: Temp:  [98.4 F (36.9 C)-99.5 F (37.5 C)] 98.6 F (37 C) (02/19 1110) Pulse Rate:  [71-99] 73 (02/19 1110) Resp:  [13-29] 20 (02/19 1110) BP: (98-125)/(53-92) 101/67 (02/19 1110) SpO2:  [93 %-100 %] 99 % (02/19 1110) Weight:  [95.7 kg (211 lb)-96.2 kg (212 lb)] 95.7 kg (211 lb) (02/18 1831)  Intake/Output from previous day: No intake/output data recorded. Intake/Output this shift: Total I/O In: 240 [P.O.:240] Out: -  Nutritional status: Fall precautions Diet Heart Room service appropriate? Yes; Fluid consistency: Thin  Neurologic Exam: Mental Status: Alert, oriented, thought content appropriate.  Speech fluent without evidence of aphasia.  Able to follow 3 step commands without difficulty. Cranial Nerves: II: Discs flat bilaterally; Visual fields grossly normal, pupils equal, round, reactive to light and accommodation III,IV, VI: ptosis not present, extra-ocular motions intact bilaterally V,VII: smile symmetric, facial light touch sensation normal bilaterally VIII: hearing normal bilaterally IX,X: gag reflex present XI: bilateral shoulder shrug XII: midline tongue extension Motor: Right : Upper extremity   5/5    Left:     Upper extremity   5-/5 with pronator drift  Lower extremity   5/5     Lower extremity   5/5 Sensory: Pinprick and light touch decreased in the left hand  Lab Results: Basic Metabolic Panel: Recent Labs  Lab 11/09/17 1235  NA 136  K 3.4*  CL 105  CO2 22  GLUCOSE 108*  BUN 13  CREATININE 0.76  CALCIUM 8.8*    Liver Function Tests: Recent Labs  Lab 11/09/17 1235  AST 28  ALT 33  ALKPHOS 54  BILITOT 0.9  PROT 7.4   ALBUMIN 4.1   No results for input(s): LIPASE, AMYLASE in the last 168 hours. No results for input(s): AMMONIA in the last 168 hours.  CBC: Recent Labs  Lab 11/09/17 1235  WBC 10.5  NEUTROABS 6.9*  HGB 10.8*  HCT 35.0  MCV 52.0*  PLT 379    Cardiac Enzymes: Recent Labs  Lab 11/09/17 1235  TROPONINI <0.03    Lipid Panel: Recent Labs  Lab 11/10/17 0530  CHOL 109  TRIG 63  HDL 47  CHOLHDL 2.3  VLDL 13  LDLCALC 49    CBG: Recent Labs  Lab 11/09/17 1303  GLUCAP 115*    Microbiology: No results found for this or any previous visit.  Coagulation Studies: Recent Labs    11/09/17 1235  LABPROT 13.1  INR 1.00    Imaging: Ct Angio Head W Or Wo Contrast  Result Date: 11/09/2017 CLINICAL DATA:  37 year old female with code stroke presentation. Symptoms after exercising at the gym. Sudden onset left arm numbness EXAM: CT ANGIOGRAPHY HEAD AND NECK TECHNIQUE: Multidetector CT imaging of the head and neck was performed using the standard protocol during bolus administration of intravenous contrast. Multiplanar CT image reconstructions and MIPs were obtained to evaluate the vascular anatomy. Carotid stenosis measurements (when applicable) are obtained utilizing NASCET criteria, using the distal internal carotid diameter as the denominator. CONTRAST:  57m ISOVUE-370 IOPAMIDOL (ISOVUE-370) INJECTION 76% COMPARISON:  Noncontrast head CT 1244 hr today. FINDINGS:  CTA NECK Skeleton: Nonspecific reversal of cervical lordosis. No acute osseous abnormality identified. Paranasal sinuses and mastoids are clear. Upper chest: Negative visualized upper chest. Normal superior mediastinum. Other neck: Negative.  No cervical lymphadenopathy. Aortic arch: 3 vessel arch configuration. No arch atherosclerosis or great vessel origin stenosis. Right carotid system: Negative. Left carotid system: Negative. Vertebral arteries: Normal proximal right subclavian artery and right vertebral artery  origin. The right vertebral is normal to the skull base. Normal proximal left subclavian artery and left vertebral artery origin. The vertebral arteries are codominant in the neck, and the left is normal to the skull base. CTA HEAD Posterior circulation: Normal distal vertebral arteries and vertebrobasilar junction. The a ICAs appear dominant. No basilar stenosis. SCA and PCA origins are normal. The right posterior communicating artery is present while the left is diminutive or absent. Bilateral PCA branches are within normal limits. Anterior circulation: Both ICA siphons are patent with no atherosclerosis or stenosis identified. Ophthalmic artery and right posterior communicating artery origins are normal. Patent carotid termini. Normal MCA and ACA origins. Diminutive or absent anterior communicating artery. Bilateral ACA is are within normal limits. Left MCA M1 segment, bifurcation, and left MCA branches are within normal limits. Right MCA M1 segment, bifurcation, and right MCA branches are within normal limits. Venous sinuses: Patent. Anatomic variants: None. Delayed phase: No abnormal enhancement identified. Stable gray-white matter differentiation throughout the brain. Review of the MIP images confirms the above findings IMPRESSION: 1. Negative CTA head and neck. No large vessel occlusion. No atherosclerosis or stenosis identified. 2. Stable and negative CT appearance of the brain. Electronically Signed   By: Genevie Ann M.D.   On: 11/09/2017 13:53   Ct Angio Neck W Or Wo Contrast  Result Date: 11/09/2017 CLINICAL DATA:  37 year old female with code stroke presentation. Symptoms after exercising at the gym. Sudden onset left arm numbness EXAM: CT ANGIOGRAPHY HEAD AND NECK TECHNIQUE: Multidetector CT imaging of the head and neck was performed using the standard protocol during bolus administration of intravenous contrast. Multiplanar CT image reconstructions and MIPs were obtained to evaluate the vascular  anatomy. Carotid stenosis measurements (when applicable) are obtained utilizing NASCET criteria, using the distal internal carotid diameter as the denominator. CONTRAST:  73m ISOVUE-370 IOPAMIDOL (ISOVUE-370) INJECTION 76% COMPARISON:  Noncontrast head CT 1244 hr today. FINDINGS: CTA NECK Skeleton: Nonspecific reversal of cervical lordosis. No acute osseous abnormality identified. Paranasal sinuses and mastoids are clear. Upper chest: Negative visualized upper chest. Normal superior mediastinum. Other neck: Negative.  No cervical lymphadenopathy. Aortic arch: 3 vessel arch configuration. No arch atherosclerosis or great vessel origin stenosis. Right carotid system: Negative. Left carotid system: Negative. Vertebral arteries: Normal proximal right subclavian artery and right vertebral artery origin. The right vertebral is normal to the skull base. Normal proximal left subclavian artery and left vertebral artery origin. The vertebral arteries are codominant in the neck, and the left is normal to the skull base. CTA HEAD Posterior circulation: Normal distal vertebral arteries and vertebrobasilar junction. The a ICAs appear dominant. No basilar stenosis. SCA and PCA origins are normal. The right posterior communicating artery is present while the left is diminutive or absent. Bilateral PCA branches are within normal limits. Anterior circulation: Both ICA siphons are patent with no atherosclerosis or stenosis identified. Ophthalmic artery and right posterior communicating artery origins are normal. Patent carotid termini. Normal MCA and ACA origins. Diminutive or absent anterior communicating artery. Bilateral ACA is are within normal limits. Left MCA M1 segment, bifurcation, and  left MCA branches are within normal limits. Right MCA M1 segment, bifurcation, and right MCA branches are within normal limits. Venous sinuses: Patent. Anatomic variants: None. Delayed phase: No abnormal enhancement identified. Stable  gray-white matter differentiation throughout the brain. Review of the MIP images confirms the above findings IMPRESSION: 1. Negative CTA head and neck. No large vessel occlusion. No atherosclerosis or stenosis identified. 2. Stable and negative CT appearance of the brain. Electronically Signed   By: Genevie Ann M.D.   On: 11/09/2017 13:53   Ct Head Code Stroke Wo Contrast  Result Date: 11/09/2017 CLINICAL DATA:  Code stroke. Paresthesia. Numbness and tingling left side EXAM: CT HEAD WITHOUT CONTRAST TECHNIQUE: Contiguous axial images were obtained from the base of the skull through the vertex without intravenous contrast. COMPARISON:  None. FINDINGS: Brain: No evidence of acute infarction, hemorrhage, hydrocephalus, extra-axial collection or mass lesion/mass effect. Vascular: Negative for vascular thrombosis Skull: Negative Sinuses/Orbits: Negative Other: None ASPECTS ([place] Stroke Program Early CT Score) - Ganglionic level infarction (caudate, lentiform nuclei, internal capsule, insula, M1-M3 cortex): 7 - Supraganglionic infarction (M4-M6 cortex): 3 Total score (0-10 with 10 being normal): 10 IMPRESSION: 1. Negative CT head 2. ASPECTS is 10 3. These results were called by telephone at the time of interpretation on 11/09/2017 at 12:58 pm to Dr. Jimmye Norman, who verbally acknowledged these results. Electronically Signed   By: Franchot Gallo M.D.   On: 11/09/2017 12:58    Medications:  I have reviewed the patient's current medications. Scheduled: . aspirin  325 mg Oral Daily  . enoxaparin (LOVENOX) injection  40 mg Subcutaneous Q24H    Assessment/Plan: Patient with intermittent symptoms.  Work up to date is unremarkable and includes an unremarkable head CT and head/neck CTA.  A1c 4.5.  LDL 49.  Echocardiogram pending.    Recommendations: 1.  Continue ASA 2.  MRI of the brain without contrast 3.  TSH, B12, Vitamin D, ESR   LOS: 0 days   Alexis Goodell, MD Neurology [phone] 11/10/2017  11:24  AM  Addendum: MRI of the brain reviewed and shows a small right corticospinal tract acute infarct.  Echocardiogram is pending but was not done as a bubble study.  TEE indicated.  Hypercoag lab work ordered.  Alexis Goodell, MD Neurology [phone]

## 2017-11-10 NOTE — Plan of Care (Signed)
Pt progressing. Handout provided with teach back. Continuing to monitor

## 2017-11-10 NOTE — Progress Notes (Signed)
PT Cancellation Note  Patient Details Name: Natasha BoschDorothy Moreno MRN: [id number] DOB: 1980/12/11   Cancelled Treatment:    Reason Eval/Treat Not Completed: PT screened, no needs identified, will sign off.  Pt seen for PT screen from [phone]; pt independent with bed mobility, transfers, and ambulation 280 feet no AD; modified independent navigating 6 stairs with railing.  B LE proprioception, coordination (heel to shin), sensation, tone, and strength WFL.  Pt c/o mild numbness L jaw area.  No loss of balance with static and dynamic standing activities.  No PT needs identified; CM notified.  Hendricks LimesEmily Lexii Walsh, PT 11/10/17, 9:45 AM [phone]

## 2017-11-10 NOTE — Progress Notes (Signed)
SLP Cancellation Note  Patient Details Name: Natasha Moreno MRN: [id number] DOB: [birth year]   Cancelled treatment:       Reason Eval/Treat Not Completed: SLP screened, no needs identified, will sign off(chart reviewed; consulted NSG then met w/ pt/family). Pt denied any difficulty swallowing and is currently on a regular diet; tolerates swallowing pills w/ water per NSG. Pt conversed at conversational level w/out deficits noted; pt and family denied any speech-language deficits.  No further skilled ST services indicated as pt appears at her baseline. Pt agreed. NSG to reconsult if any change in status.    Orinda Kenner, MS, CCC-SLP S[place] [place]bee 11/10/2017, 11:06 AM

## 2017-11-10 NOTE — Progress Notes (Addendum)
OT Cancellation Note  Patient Details Name: Natasha Moreno MRN: [id number] DOB: 09-20-1981   Cancelled Treatment:    Reason Eval/Treat Not Completed: Patient at procedure or test/ unavailable. Order received, chart reviewed. MD in with pt to assess upon attempt to evaluate. Will re-attempt OT evaluation at later time as pt is available and medically appropriate.  Richrd PrimeJamie Stiller, MPH, MS, OTR/L ascom [phone] 11/10/17, 11:49 AM

## 2017-11-11 ENCOUNTER — Encounter
Admission: EM | Disposition: A | Discharge status: Home / Self Care | Payer: Self-pay | Source: Home / Self Care | Attending: Emergency Medicine

## 2017-11-11 ENCOUNTER — Observation Stay
Admit: 2017-11-11 | Discharge: 2017-11-11 | Disposition: A | Discharge status: Home / Self Care | Payer: BLUE CROSS/BLUE SHIELD | Attending: Cardiology | Admitting: Cardiology

## 2017-11-11 ENCOUNTER — Encounter: Payer: Self-pay | Admitting: *Deleted

## 2017-11-11 DIAGNOSIS — Z720 Tobacco use: Secondary | ICD-10-CM | POA: Diagnosis not present

## 2017-11-11 DIAGNOSIS — I6389 Other cerebral infarction: Secondary | ICD-10-CM | POA: Diagnosis not present

## 2017-11-11 DIAGNOSIS — R200 Anesthesia of skin: Secondary | ICD-10-CM

## 2017-11-11 DIAGNOSIS — I639 Cerebral infarction, unspecified: Secondary | ICD-10-CM

## 2017-11-11 DIAGNOSIS — G459 Transient cerebral ischemic attack, unspecified: Secondary | ICD-10-CM | POA: Diagnosis not present

## 2017-11-11 DIAGNOSIS — E669 Obesity, unspecified: Secondary | ICD-10-CM | POA: Diagnosis not present

## 2017-11-11 DIAGNOSIS — Z8673 Personal history of transient ischemic attack (TIA), and cerebral infarction without residual deficits: Secondary | ICD-10-CM | POA: Diagnosis not present

## 2017-11-11 HISTORY — PX: TEE WITHOUT CARDIOVERSION: SHX5443

## 2017-11-11 LAB — PROTEIN S, TOTAL: Protein S Ag, Total: 66 % (ref 60–150)

## 2017-11-11 LAB — CARDIOLIPIN ANTIBODIES, IGG, IGM, IGA: Anticardiolipin IgM: <9 MPL U/mL (ref 0–12)

## 2017-11-11 LAB — LUPUS ANTICOAGULANT PANEL
DRVVT: 31.9 s (ref 0.0–47.0)
PTT Lupus Anticoagulant: 31.3 s (ref 0.0–51.9)

## 2017-11-11 LAB — PROTEIN C, TOTAL: Protein C, Total: 74 % (ref 60–150)

## 2017-11-11 LAB — VITAMIN D 25 HYDROXY (VIT D DEFICIENCY, FRACTURES): VIT D 25 HYDROXY: 16.8 ng/mL — AB (ref 30.0–100.0)

## 2017-11-11 LAB — HIV ANTIBODY (ROUTINE TESTING W REFLEX): HIV Screen 4th Generation wRfx: NONREACTIVE

## 2017-11-11 LAB — HOMOCYSTEINE: HOMOCYSTEINE-NORM: 10.7 umol/L (ref 0.0–15.0)

## 2017-11-11 SURGERY — ECHOCARDIOGRAM, TRANSESOPHAGEAL
Anesthesia: Moderate Sedation

## 2017-11-11 MED ORDER — MIDAZOLAM HCL 5 MG/5ML IJ SOLN
INTRAMUSCULAR | Status: AC
  Filled 2017-11-11: qty 5

## 2017-11-11 MED ORDER — LIDOCAINE VISCOUS 2 % MT SOLN
OROMUCOSAL | Status: AC
  Filled 2017-11-11: qty 15

## 2017-11-11 MED ORDER — ASPIRIN 81 MG PO TBEC: 325.0000 mg | DELAYED_RELEASE_TABLET | Freq: Every day | ORAL | 0 refills | Status: DC

## 2017-11-11 MED ORDER — BUTAMBEN-TETRACAINE-BENZOCAINE 2-2-14 % EX AERO
INHALATION_SPRAY | CUTANEOUS | Status: AC
  Filled 2017-11-11: qty 5

## 2017-11-11 MED ORDER — MIDAZOLAM HCL 5 MG/5ML IJ SOLN
INTRAMUSCULAR | Status: AC | PRN
  Administered 2017-11-11 (×2): 1 mg via INTRAVENOUS
  Administered 2017-11-11: 2 mg via INTRAVENOUS

## 2017-11-11 MED ORDER — FENTANYL CITRATE (PF) 100 MCG/2ML IJ SOLN
INTRAMUSCULAR | Status: AC
  Filled 2017-11-11: qty 2

## 2017-11-11 MED ORDER — LIDOCAINE VISCOUS 2 % MT SOLN
OROMUCOSAL | Status: AC | PRN
  Administered 2017-11-11: 15 mL via ORAL

## 2017-11-11 MED ORDER — SODIUM CHLORIDE 0.9 % IV SOLN: INTRAVENOUS | Status: DC

## 2017-11-11 MED ORDER — ASPIRIN EC 325 MG PO TBEC
325.0000 mg | DELAYED_RELEASE_TABLET | Freq: Every day | ORAL | Status: DC
  Administered 2017-11-11: 15:00:00 325 mg via ORAL
  Filled 2017-11-11: qty 1

## 2017-11-11 MED ORDER — VITAMIN D (ERGOCALCIFEROL) 1.25 MG (50000 UNIT) PO CAPS
50000.0000 [IU] | ORAL_CAPSULE | Freq: Once | ORAL | Status: AC
  Administered 2017-11-11: 50000 [IU] via ORAL
  Filled 2017-11-11: qty 1

## 2017-11-11 MED ORDER — FENTANYL CITRATE (PF) 100 MCG/2ML IJ SOLN
INTRAMUSCULAR | Status: AC | PRN
  Administered 2017-11-11: 50 ug via INTRAVENOUS
  Administered 2017-11-11 (×2): 25 ug via INTRAVENOUS

## 2017-11-11 MED ORDER — BUTAMBEN-TETRACAINE-BENZOCAINE 2-2-14 % EX AERO
INHALATION_SPRAY | CUTANEOUS | Status: AC | PRN
  Administered 2017-11-11: 1 via TOPICAL

## 2017-11-11 MED ORDER — SODIUM CHLORIDE FLUSH 0.9 % IV SOLN
INTRAVENOUS | Status: AC
  Filled 2017-11-11: qty 10

## 2017-11-11 MED ORDER — ATORVASTATIN CALCIUM 40 MG PO TABS: 40.0000 mg | ORAL_TABLET | Freq: Every day | ORAL | 0 refills | Status: DC

## 2017-11-11 NOTE — Progress Notes (Signed)
*  PRELIMINARY RESULTS* Echocardiogram Echocardiogram Transesophageal has been performed.  Cristela BlueHege, Lenise Jr 11/11/2017, 11:38 AM

## 2017-11-11 NOTE — Progress Notes (Signed)
Pt being discharged home, discharge instructions and prescriptions reviewed with pt, states understanding, pt with no complaints at discharge 

## 2017-11-11 NOTE — Discharge Summary (Signed)
[facility] [place] at [facility]   PATIENT NAME: Natasha Moreno    MR#:  [id number]  DATE OF BIRTH:  09-14-1981  DATE OF ADMISSION:  11/09/2017 ADMITTING PHYSICIAN: Katha Hamming, MD  DATE OF DISCHARGE: No discharge date for patient encounter.  PRIMARY CARE PHYSICIAN: [facility], [place]    ADMISSION DIAGNOSIS:  TIA (transient ischemic attack) [G45.9] Paresthesia [R20.2] Left sided numbness [R20.0]  DISCHARGE DIAGNOSIS:  Active Problems:   Left sided numbness   SECONDARY DIAGNOSIS:  History reviewed. No pertinent past medical history.  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 Treated with aspirin, statin therapy, MRI noted for above stroke, echocardiogram was a negative study, TEE done-no evidence for embolic etiology, CT angiogram of the neck was negative, neurology did see patient while in house, PT/OT/speech therapy did see patient as well-no needs identified, patient to be discharged home with follow-up with neurology in 2-4 months for reevaluation, patient to establish care with local primary care provider in 3-5 days  DISCHARGE CONDITIONS:  On day of discharge patient is afebrile, he dynamically stable, tolerating diet, ready for discharge to home with appropriate follow-up stated above  CONSULTS OBTAINED:  Treatment Team:  Kym Groom, MD Dalia Heading, MD  DRUG ALLERGIES:  No Known Allergies  DISCHARGE MEDICATIONS:   Allergies as of 11/11/2017   No Known Allergies     Medication List    STOP taking these medications   diazepam 2 MG tablet Commonly known as:  VALIUM   lidocaine 5 % Commonly known as:  LIDODERM   traMADol 50 MG tablet Commonly known as:  ULTRAM     TAKE these medications   aspirin 81 MG EC tablet Take 4 tablets (325 mg total) by mouth daily. Start taking on:  11/12/2017   atorvastatin 40 MG tablet Commonly known as:  LIPITOR Take 1 tablet  (40 mg total) by mouth daily at 6 PM.        DISCHARGE INSTRUCTIONS:   If you experience worsening of your admission symptoms, develop shortness of breath, life threatening emergency, suicidal or homicidal thoughts you must seek medical attention immediately by calling 911 or calling your MD immediately  if symptoms less severe.  You Must read complete instructions/literature along with all the possible adverse reactions/side effects for all the Medicines you take and that have been prescribed to you. Take any new Medicines after you have completely understood and accept all the possible adverse reactions/side effects.   Please note  You were cared for by a hospitalist during your hospital stay. If you have any questions about your discharge medications or the care you received while you were in the hospital after you are discharged, you can call the unit and asked to speak with the hospitalist on call if the hospitalist that took care of you is not available. Once you are discharged, your primary care physician will handle any further medical issues. Please note that NO REFILLS for any discharge medications will be authorized once you are discharged, as it is imperative that you return to your primary care physician (or establish a relationship with a primary care physician if you do not have one) for your aftercare needs so that they can reassess your need for medications and monitor your lab values.    Today   CHIEF COMPLAINT:   Chief Complaint  Patient presents with  . Numbness    HISTORY OF PRESENT ILLNESS:  37 y.o. female with  past medical history started to have left-sided numbness including left side of the face when she was at the gym around 1130 this morning.  Did not have slurred speech.  No head headache.  No weakness on the left side.  No facial droop.  Initial stroke scale.  Seen by Dr. Thad Ranger from neurology recommended admission and full stroke workup.  Patient CT Angio  of head and neck unremarkable.  He also passed a swallow screen.   VITAL SIGNS:  Blood pressure 112/75, pulse 87, temperature 98.2 F (36.8 C), temperature source Oral, resp. rate 18, height 5' (1.524 m), weight 95.7 kg (211 lb), SpO2 100 %.  I/O:    Intake/Output Summary (Last 24 hours) at 11/11/2017 1550 Last data filed at 11/10/2017 1910 Gross per 24 hour  Intake 240 ml  Output -  Net 240 ml    PHYSICAL EXAMINATION:  GENERAL:  37 y.o.-year-old patient lying in the bed with no acute distress.  EYES: Pupils equal, round, reactive to light and accommodation. No scleral icterus. Extraocular muscles intact.  HEENT: Head atraumatic, normocephalic. Oropharynx and nasopharynx clear.  NECK:  Supple, no jugular venous distention. No thyroid enlargement, no tenderness.  LUNGS: Normal breath sounds bilaterally, no wheezing, rales,rhonchi or crepitation. No use of accessory muscles of respiration.  CARDIOVASCULAR: S1, S2 normal. No murmurs, rubs, or gallops.  ABDOMEN: Soft, non-tender, non-distended. Bowel sounds present. No organomegaly or mass.  EXTREMITIES: No pedal edema, cyanosis, or clubbing.  NEUROLOGIC: Cranial nerves II through XII are intact. Muscle strength 5/5 in all extremities. Sensation intact. Gait not checked.  PSYCHIATRIC: The patient is alert and oriented x 3.  SKIN: No obvious rash, lesion, or ulcer.   DATA REVIEW:   CBC Recent Labs  Lab 11/09/17 1235  WBC 10.5  HGB 10.8*  HCT 35.0  PLT 379    Chemistries  Recent Labs  Lab 11/09/17 1235  NA 136  K 3.4*  CL 105  CO2 22  GLUCOSE 108*  BUN 13  CREATININE 0.76  CALCIUM 8.8*  AST 28  ALT 33  ALKPHOS 54  BILITOT 0.9    Cardiac Enzymes Recent Labs  Lab 11/09/17 1235  TROPONINI <0.03    Microbiology Results  No results found for this or any previous visit.  RADIOLOGY:  Mr Brain Wo Contrast  Result Date: 11/10/2017 CLINICAL DATA:  Left-sided numbness including the left side of the face  beginning 24 hours ago. EXAM: MRI HEAD WITHOUT CONTRAST TECHNIQUE: Multiplanar, multiecho pulse sequences of the brain and surrounding structures were obtained without intravenous contrast. COMPARISON:  CT examinations 11/09/2017 FINDINGS: Brain: Diffusion imaging shows a subcentimeter acute infarction at the base of the brain on the right, most consistent with the cortical spinal tract in location. There could be some involvement of the lateral thalamus. Elsewhere, the brain has a normal appearance. No other small or large vessel infarction. No mass lesion, hemorrhage, hydrocephalus or extra-axial collection. Vascular: Major vessels at the base of the brain show flow. Skull and upper cervical spine: Negative Sinuses/Orbits: Clear/normal Other: None IMPRESSION: Subcentimeter acute infarction at the base of the brain on the right consistent with location in the corticospinal tract, possibly with minor involvement of the lateral thalamus. No evidence of swelling or hemorrhage. Otherwise normal study. Electronically Signed   By: Paulina Fusi M.D.   On: 11/10/2017 13:01    EKG:   Orders placed or performed during the hospital encounter of 11/09/17  . ED EKG  . ED EKG  Management plans discussed with the patient, family and they are in agreement.  CODE STATUS:     Code Status Orders  (From admission, onward)        Start     Ordered   11/09/17 1501  Full code  Continuous     11/09/17 1502    Code Status History    Date Active Date Inactive Code Status Order ID Comments User Context   This patient has a current code status but no historical code status.      TOTAL TIME TAKING CARE OF THIS PATIENT: 45 minutes.    Evelena AsaMontell D Czar Ysaguirre M.D on 11/11/2017 at 3:50 PM  Between 7am to 6pm - Pager - [phone]  After 6pm go to [email or web address] - password [email or web address]  [place] [facility]  Office  [phone]  CC: [facility] physician; [facility], [place]   Note: This  dictation was prepared with Dragon dictation along with smaller phrase technology. Any transcriptional errors that result from this process are unintentional.

## 2017-11-11 NOTE — Procedures (Signed)
    TRANSESOPHAGEAL ECHOCARDIOGRAM   NAME:  Natasha Moreno   MRN: [id number] DOB:  Jul 21, 1981   ADMIT DATE: 11/09/2017  INDICATIONS:   PROCEDURE:   Informed consent was obtained prior to the procedure. The risks, benefits and alternatives for the procedure were discussed and the patient comprehended these risks.  Risks include, but are not limited to, cough, sore throat, vomiting, nausea, somnolence, esophageal and stomach trauma or perforation, bleeding, low blood pressure, aspiration, pneumonia, infection, trauma to the teeth and death.    After a procedural time-out, the patient was given 4 mg versed and 100 mcg fentanyl for moderate sedation.  The oropharynx was anesthetized 1 cc of topical 1% viscous lidocaine.  The transesophageal probe was inserted in the esophagus and stomach without difficulty and multiple views were obtained.    COMPLICATIONS:    There were no immediate complications.  FINDINGS:  LEFT VENTRICLE: EF = 55%. No regional wall motion abnormalities. No thrombi  RIGHT VENTRICLE: Normal size and function.   LEFT ATRIUM: Normal with normal function. No thrombi.   LEFT ATRIAL APPENDAGE: No thrombus. Normal doppler flow.  RIGHT ATRIUM: Normal size and function. No  thrombi  AORTIC VALVE:  Trileaflet.   MITRAL VALVE:    Normal.  TRICUSPID VALVE: Normal.  PULMONIC VALVE: Grossly normal.  INTERATRIAL SEPTUM: No PFO or ASD. Agitated saline contrast injected.   PERICARDIUM: No effusion  DESCENDING AORTA:   CONCLUSION: LV function normal Normal Left atrial appendage. Normal intratrial septum No cardiac source of emboli.

## 2017-11-11 NOTE — Progress Notes (Signed)
Subjective: No new neurological complaints  Objective: Current vital signs: BP 112/75   Pulse 87   Temp 98.2 F (36.8 C) (Oral)   Resp 18   Ht 5' (1.524 m)   Wt 95.7 kg (211 lb)   SpO2 100%   BMI 41.21 kg/m  Vital signs in last 24 hours: Temp:  [98.2 F (36.8 C)-98.8 F (37.1 C)] 98.2 F (36.8 C) (02/20 1234) Pulse Rate:  [66-95] 87 (02/20 1234) Resp:  [12-26] 18 (02/20 1200) BP: (90-132)/(45-94) 112/75 (02/20 1234) SpO2:  [95 %-100 %] 100 % (02/20 1234)  Intake/Output from previous day: 02/19 0701 - 02/20 0700 In: 480 [P.O.:480] Out: -  Intake/Output this shift: No intake/output data recorded. Nutritional status: Fall precautions Diet Heart Room service appropriate? Yes; Fluid consistency: Thin  Neurologic Exam: Mental Status: Alert, oriented, thought content appropriate.  Speech fluent without evidence of aphasia.  Able to follow 3 step commands without difficulty. Cranial Nerves: II: Discs flat bilaterally; Visual fields grossly normal, pupils equal, round, reactive to light and accommodation III,IV, VI: ptosis not present, extra-ocular motions intact bilaterally V,VII: smile symmetric, facial light touch sensation normal bilaterally VIII: hearing normal bilaterally IX,X: gag reflex present XI: bilateral shoulder shrug XII: midline tongue extension Motor: Right :  Upper extremity   5/5                                      Left:     Upper extremity   5-/5 with pronator drift             Lower extremity   5/5                                                  Lower extremity   5/5 Sensory: Pinprick and light touch intact throughout  Lab Results: Basic Metabolic Panel: Recent Labs  Lab 11/09/17 1235  NA 136  K 3.4*  CL 105  CO2 22  GLUCOSE 108*  BUN 13  CREATININE 0.76  CALCIUM 8.8*    Liver Function Tests: Recent Labs  Lab 11/09/17 1235  AST 28  ALT 33  ALKPHOS 54  BILITOT 0.9  PROT 7.4  ALBUMIN 4.1   No results for input(s): LIPASE, AMYLASE  in the last 168 hours. No results for input(s): AMMONIA in the last 168 hours.  CBC: Recent Labs  Lab 11/09/17 1235  WBC 10.5  NEUTROABS 6.9*  HGB 10.8*  HCT 35.0  MCV 52.0*  PLT 379    Cardiac Enzymes: Recent Labs  Lab 11/09/17 1235  TROPONINI <0.03    Lipid Panel: Recent Labs  Lab 11/10/17 0530  CHOL 109  TRIG 63  HDL 47  CHOLHDL 2.3  VLDL 13  LDLCALC 49    CBG: Recent Labs  Lab 11/09/17 1303  GLUCAP 115*    Microbiology: No results found for this or any previous visit.  Coagulation Studies: Recent Labs    11/09/17 1235  LABPROT 13.1  INR 1.00    Imaging: Mr Brain Wo Contrast  Result Date: 11/10/2017 CLINICAL DATA:  Left-sided numbness including the left side of the face beginning 24 hours ago. EXAM: MRI HEAD WITHOUT CONTRAST TECHNIQUE: Multiplanar, multiecho pulse sequences of the brain and surrounding structures were obtained without intravenous contrast.  COMPARISON:  CT examinations 11/09/2017 FINDINGS: Brain: Diffusion imaging shows a subcentimeter acute infarction at the base of the brain on the right, most consistent with the cortical spinal tract in location. There could be some involvement of the lateral thalamus. Elsewhere, the brain has a normal appearance. No other small or large vessel infarction. No mass lesion, hemorrhage, hydrocephalus or extra-axial collection. Vascular: Major vessels at the base of the brain show flow. Skull and upper cervical spine: Negative Sinuses/Orbits: Clear/normal Other: None IMPRESSION: Subcentimeter acute infarction at the base of the brain on the right consistent with location in the corticospinal tract, possibly with minor involvement of the lateral thalamus. No evidence of swelling or hemorrhage. Otherwise normal study. Electronically Signed   By: Paulina Fusi M.D.   On: 11/10/2017 13:01    Medications:  I have reviewed the patient's current medications. Scheduled: . aspirin  325 mg Oral Daily  .  atorvastatin  40 mg Oral q1800  . butamben-tetracaine-benzocaine      . enoxaparin (LOVENOX) injection  40 mg Subcutaneous Q24H  . fentaNYL      . lidocaine      . midazolam      . sodium chloride flush      . Vitamin D (Ergocalciferol)  50,000 Units Oral Once    Assessment/Plan: 37 year old female presenting with left sided numbness.  MRI indicative of an acute infarct.  TEE today showed no evidence of PFO or intracardiac thrombus.  Etiology for infarct remains unclear.    Recommendations: 1.  Continue ASA and statin 2.  Patient to follow up with neurology on an outpatient basis with blood work to be followed up at that time.   3.  Patient to follow up with cardiology for consideration for prolonged cardiac monitoring 4.  Smoking cessation counseling   LOS: 0 days   Thana Farr, MD Neurology [phone] 11/11/2017  1:48 PM

## 2017-11-13 LAB — FACTOR 5 LEIDEN

## 2017-11-17 ENCOUNTER — Telehealth: Payer: Self-pay

## 2017-11-17 ENCOUNTER — Encounter: Payer: Self-pay | Admitting: Internal Medicine

## 2017-11-17 ENCOUNTER — Ambulatory Visit: Payer: Self-pay | Admitting: Internal Medicine

## 2017-11-17 VITALS — BP 126/89 | HR 53 | Resp 16 | Ht 60.0 in | Wt 212.8 lb

## 2017-11-17 DIAGNOSIS — I639 Cerebral infarction, unspecified: Secondary | ICD-10-CM | POA: Diagnosis not present

## 2017-11-17 DIAGNOSIS — G479 Sleep disorder, unspecified: Secondary | ICD-10-CM

## 2017-11-17 DIAGNOSIS — D519 Vitamin B12 deficiency anemia, unspecified: Secondary | ICD-10-CM

## 2017-11-17 MED ORDER — ATORVASTATIN CALCIUM 10 MG PO TABS
10.0000 mg | ORAL_TABLET | Freq: Every day | ORAL | 3 refills | Status: DC
Start: 2017-11-17 — End: 2018-02-11

## 2017-11-17 MED ORDER — CYANOCOBALAMIN 1000 MCG/ML IJ SOLN: 1000.0000 ug | Freq: Once | INTRAMUSCULAR | Status: DC

## 2017-11-17 NOTE — Progress Notes (Signed)
[facility] [address] [place], [place] [address]  Internal MEDICINE  Office Visit Note  Patient Name: Natasha Moreno  [id number]  [id number]  Date of Service: 11/23/2017   Complaints/HPI Pt is here for establishment of PCP.  She is here with c/o weakness and numbness of her left side, however it has improved. 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 Pt was kept for few days, did extensive work up with no etiology at this time.   Cerebrovascular Accident  This is a new problem. The current episode started in the past 7 days. The problem has been gradually improving. Associated symptoms include fatigue. Numbness: left face  Weakness: left side  Exacerbated by: pt was at home  Improvement on treatment: pt was hospitalized for treatment   Other  This is a chronic (C/O snoring and stopps breathing at night) problem. The current episode started more than 1 year ago. The problem occurs constantly. The problem has been gradually worsening. Associated symptoms include fatigue. Numbness: left face  Weakness: left side  Nothing aggravates the symptoms. She has tried nothing for the symptoms. The treatment provided no relief.    Current Medication: Outpatient Encounter Medications as of 11/17/2017  Medication Sig  . aspirin EC 81 MG EC tablet Take 4 tablets (325 mg total) by mouth daily.  . [DISCONTINUED] atorvastatin (LIPITOR) 40 MG tablet Take 1 tablet (40 mg total) by mouth daily at 6 PM.  . atorvastatin (LIPITOR) 10 MG tablet Take 1 tablet (10 mg total) by mouth daily.  . cetirizine (ZYRTEC ALLERGY) 10 MG tablet Take by mouth.   Facility-Administered Encounter Medications as of 11/17/2017  Medication  . cyanocobalamin ((VITAMIN B-12)) injection 1,000 mcg    Surgical History: Past Surgical History:  Procedure Laterality Date  . CHOLECYSTECTOMY    . TEE WITHOUT CARDIOVERSION N/A 11/11/2017   Procedure:  TRANSESOPHAGEAL ECHOCARDIOGRAM (TEE);  Surgeon: Dalia Heading, MD;  Location: [facility] ORS;  Service: Cardiovascular;  Laterality: N/A;  . TUBAL LIGATION    . WISDOM TOOTH EXTRACTION      Medical History: Past Medical History:  Diagnosis Date  . Hyperlipidemia   . Stroke [facility])     Family History: Family History  Problem Relation Age of Onset  . Kidney failure Father   . Heart attack Father        open heart surgery  . Hypertension Father   . Diabetes Father   . Gout Father   . Cataracts Father     Social History   Socioeconomic History  . Marital status: Single    Spouse name: Not on file  . Number of children: 2  . Years of education: Not on file  . Highest education level: Not on file  Social Needs  . Financial resource strain: Not hard at all  . Food insecurity - worry: Never true  . Food insecurity - inability: Never true  . Transportation needs - medical: Yes  . Transportation needs - non-medical: Yes  Occupational History  . Not on file  Tobacco Use  . Smoking status: Former Smoker    Packs/day: 0.25    Types: Cigarettes    Last attempt to quit: 11/09/2017    Years since quitting: 0.0  . Smokeless tobacco: Never Used  . Tobacco comment: approximately 4 cigarettes a day  Substance and Sexual Activity  . Alcohol use: No  . Drug use: No  .  Sexual activity: No  Other Topics Concern  . Not on file  Social History Narrative  . Not on file     Review of Systems  Constitutional: Positive for fatigue. Negative for unexpected weight change.  HENT: Positive for postnasal drip. Negative for rhinorrhea and sneezing.   Eyes: Negative for redness.  Respiratory: Negative for chest tightness and shortness of breath.   Cardiovascular: Negative for palpitations.  Gastrointestinal: Negative for constipation and diarrhea.  Genitourinary: Negative for dysuria and frequency.  Musculoskeletal: Negative for back pain.  Neurological: Negative for tremors. Weakness: left  side  Numbness: left face   Hematological: Negative for adenopathy. Does not bruise/bleed easily.  Psychiatric/Behavioral: Negative for behavioral problems (Depression), sleep disturbance and suicidal ideas. The patient is not nervous/anxious.     Vital Signs: BP 126/89 (BP Location: Left Arm, Patient Position: Sitting)   Pulse (!) 53   Resp 16   Ht 5' (1.524 m)   Wt 212 lb 12.8 oz (96.5 kg)   SpO2 97%   BMI 41.56 kg/m    Physical Exam  Constitutional: She is oriented to person, place, and time. She appears well-developed and well-nourished. No distress.  HENT:  Head: Normocephalic and atraumatic.  Mouth/Throat: Oropharynx is clear and moist. No oropharyngeal exudate.  Eyes: EOM are normal. Pupils are equal, round, and reactive to light.  Neck: Normal range of motion. Neck supple. No JVD present. No tracheal deviation present. No thyromegaly present.  Cardiovascular: Normal rate, regular rhythm and normal heart sounds. Exam reveals no gallop and no friction rub.  No murmur heard. Pulmonary/Chest: Effort normal. No respiratory distress. She has no wheezes. She has no rales. She exhibits no tenderness.  Abdominal: Soft. Bowel sounds are normal.  Musculoskeletal: Normal range of motion.  Lymphadenopathy:    She has no cervical adenopathy.  Neurological: She is alert and oriented to person, place, and time. No cranial nerve deficit.  4/5.Marland Kitchen. Motor strength Left upper and lower ext   Skin: Skin is warm and dry. She is not diaphoretic.  Psychiatric: She has a normal mood and affect. Her behavior is normal. Judgment and thought content normal.   Assessment/Plan: 1. Acute cerebral infarction [facility](HCC) - Ambulatory referral to Neurology. F/iu  - Ambulatory referral to Hematology. To discuss all hospital labs  - Ambulatory referral to Cardiology. Pt might need any other source of cardiac cause, Bubble study in future   2. Sleep disorder - [facility] SLEEP STUDY; Future  3. Anemia due to vitamin  B12 deficiency, unspecified B12 deficiency type - Borderline low B12 , will supplement today  - Fe+TIBC+Fer - cyanocobalamin ((VITAMIN B-12)) injection 1,000 mcg  4. Morbid obesity (HCC) - encouraged weight loss   General Counseling: Nihira verbalizes understanding of the findings of todays visit and agrees with plan of treatment. I have discussed any further diagnostic evaluation that may be needed or ordered today. We also reviewed her medications today. she has been encouraged to call the office with any questions or concerns that should arise related to todays visit.  Paper work for disability is filled out     Orders Placed This Encounter  Procedures  . Fe+TIBC+Fer  . Ambulatory referral to Neurology  . Ambulatory referral to Hematology  . Ambulatory referral to Cardiology  . [facility] SLEEP STUDY    Meds ordered this encounter  Medications  . atorvastatin (LIPITOR) 10 MG tablet    Sig: Take 1 tablet (10 mg total) by mouth daily.    Dispense:  90 tablet  Refill:  3  . cyanocobalamin ((VITAMIN B-12)) injection 1,000 mcg    Time spent:35 Minutes

## 2017-11-17 NOTE — Telephone Encounter (Signed)
PATIENT DID RECEIVE WORK NOTE TAKEN OUT OF WORK UNTIL WE HAVE RESULTS HER TESTS AND REFER APPTS UNTIL NEXT APPT WITH DR Welton FlakesKHAN 01/12/18. SHE HAS BEEN ADVISED TO DROP OFF ANY SHORT TERM DISABILITY PAPERS THAT NEED TO BE FILLED OUT AND WE CAN NOT ACCEPT THEM THROUGH FAX/ BR

## 2017-11-18 LAB — IRON,TIBC AND FERRITIN PANEL
Ferritin: 151 ng/mL — ABNORMAL HIGH (ref 15–150)
IRON SATURATION: 27 % (ref 15–55)
IRON: 86 ug/dL (ref 27–159)
TIBC: 323 ug/dL (ref 250–450)
UIBC: 237 ug/dL (ref 131–425)

## 2017-11-19 ENCOUNTER — Telehealth: Payer: Self-pay

## 2017-11-19 NOTE — Telephone Encounter (Signed)
Patient dropped off short term disability paperwork for dr Welton Flakeskhan to complete/br

## 2017-11-27 ENCOUNTER — Inpatient Hospital Stay: Payer: BLUE CROSS/BLUE SHIELD

## 2017-11-27 ENCOUNTER — Inpatient Hospital Stay: Payer: BLUE CROSS/BLUE SHIELD | Attending: Oncology | Admitting: Oncology

## 2017-11-27 ENCOUNTER — Other Ambulatory Visit: Payer: Self-pay

## 2017-11-27 ENCOUNTER — Encounter: Payer: Self-pay | Admitting: Oncology

## 2017-11-27 VITALS — BP 121/86 | HR 93 | Temp 98.5°F | Resp 16 | Ht 60.0 in | Wt 212.0 lb

## 2017-11-27 DIAGNOSIS — D582 Other hemoglobinopathies: Secondary | ICD-10-CM | POA: Diagnosis not present

## 2017-11-27 DIAGNOSIS — D509 Iron deficiency anemia, unspecified: Secondary | ICD-10-CM | POA: Insufficient documentation

## 2017-11-27 DIAGNOSIS — Z87891 Personal history of nicotine dependence: Secondary | ICD-10-CM | POA: Diagnosis not present

## 2017-11-27 DIAGNOSIS — E538 Deficiency of other specified B group vitamins: Secondary | ICD-10-CM | POA: Diagnosis not present

## 2017-11-27 DIAGNOSIS — G459 Transient cerebral ischemic attack, unspecified: Secondary | ICD-10-CM

## 2017-11-27 LAB — CBC
HCT: 34.9 % — ABNORMAL LOW (ref 35.0–47.0)
HEMOGLOBIN: 10.8 g/dL — AB (ref 12.0–16.0)
MCH: 16.3 pg — ABNORMAL LOW (ref 26.0–34.0)
MCHC: 31 g/dL — ABNORMAL LOW (ref 32.0–36.0)
MCV: 52.5 fL — ABNORMAL LOW (ref 80.0–100.0)
PLATELETS: 393 10*3/uL (ref 150–400)
RBC: 6.66 MIL/uL — AB (ref 3.80–5.20)
RDW: 17.1 % — ABNORMAL HIGH (ref 11.5–14.5)
WBC: 8 10*3/uL (ref 3.6–11.0)

## 2017-11-27 LAB — VITAMIN B12: VITAMIN B 12: 431 pg/mL (ref 180–914)

## 2017-11-27 NOTE — Progress Notes (Signed)
Hematology/Oncology Consult note [facility] [phone] [phone]  Patient Care Team: [place] as PCP - General   Name of the patient: Natasha Moreno  [id number]  21-Dec-1980    Reason for referral- b12 deficiency anemia   Referring physician- Dr. Clayborn Bigness  Date of visit: 11/27/17   History of presenting illness-patient is a 37 year old [place] female who was recently admitted to the hospital on 11/11/2017 for TIA and left-sided numbness.  She was seen by neurology and underwent MRI as well as CTA of the head and neck.  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  CTA of the head and neck was negative.  Echocardiogram and TEE was unremarkable.  As a part of her stroke workup she had protein C, protein S and Antithrombin III levels done which were unremarkable.  Factor V Leiden mutation testing was negative.  Homocystine levels were checked and were normal.  Iron studies were unremarkable.  TSH was normal and B12 levels were borderline at 283.  CBC from 11/09/2017 showed WBC of 10.5, H&H of 10.8/35 with an MCV of 52 and a platelet count of 379.  HIV testing was negative.  Patient has had hemoglobin electrophoresis back in 1996 which was consistent with hemoglobin AE hemoglobin A 80: E 20.  Fetal hemoglobin was normal at 1%  Patient denies any prior history of DVT or PE.  No family history of thrombosis other than her maternal grandmother who probably had a stroke when she was very old.  She is G2P2 L2 and had one miscarriage at 3 months prior to her first pregnancy.  She is currently not on any birth control and has had tubal ligation.  She will be seeing neurology next week.  Family history significant for hemoglobin E in her siblings.  Her sister also known to have sickle cell trait.  She is currently doing well  and denies any complaints   ECOG PS- 0  Pain scale- 0   Review of systems- Review of Systems  Constitutional: Negative for chills, fever, malaise/fatigue and weight loss.  HENT: Negative for congestion, ear discharge and nosebleeds.   Eyes: Negative for blurred vision.  Respiratory: Negative for cough, hemoptysis, sputum production, shortness of breath and wheezing.   Cardiovascular: Negative for chest pain, palpitations, orthopnea and claudication.  Gastrointestinal: Negative for abdominal pain, blood in stool, constipation, diarrhea, heartburn, melena, nausea and vomiting.  Genitourinary: Negative for dysuria, flank pain, frequency, hematuria and urgency.  Musculoskeletal: Negative for back pain, joint pain and myalgias.  Skin: Negative for rash.  Neurological: Negative for dizziness, tingling, focal weakness, seizures, weakness and headaches.  Endo/Heme/Allergies: Does not bruise/bleed easily.  Psychiatric/Behavioral: Negative for depression and suicidal ideas. The patient does not have insomnia.     No Known Allergies  Patient Active Problem List   Diagnosis Date Noted  . Left sided numbness 11/09/2017  . Subluxation of patellofemoral joint 10/05/2015     Past Medical History:  Diagnosis Date  . Hyperlipidemia   . Stroke [facility])      Past Surgical History:  Procedure Laterality Date  . CHOLECYSTECTOMY    . TEE WITHOUT CARDIOVERSION N/A 11/11/2017   Procedure: TRANSESOPHAGEAL ECHOCARDIOGRAM (TEE);  Surgeon: Teodoro Spray, MD;  Location: [facility] ORS;  Service: Cardiovascular;  Laterality: N/A;  . TUBAL LIGATION    . WISDOM TOOTH EXTRACTION  Social History   Socioeconomic History  . Marital status: Single    Spouse name: Not on file  . Number of children: 2  . Years of education: Not on file  . Highest education level: Not on file  Social Needs  . Financial resource strain: Not hard at all  . Food insecurity - worry: Never true  . Food insecurity -  inability: Never true  . Transportation needs - medical: Yes  . Transportation needs - non-medical: Yes  Occupational History  . Not on file  Tobacco Use  . Smoking status: Former Smoker    Packs/day: 0.25    Types: Cigarettes    Last attempt to quit: 11/09/2017    Years since quitting: 0.0  . Smokeless tobacco: Never Used  . Tobacco comment: approximately 4 cigarettes a day  Substance and Sexual Activity  . Alcohol use: No  . Drug use: No  . Sexual activity: No  Other Topics Concern  . Not on file  Social History Narrative  . Not on file     Family History  Problem Relation Age of Onset  . Kidney failure Father   . Heart attack Father        open heart surgery  . Hypertension Father   . Diabetes Father   . Gout Father   . Cataracts Father      Current Outpatient Medications:  .  aspirin 325 MG EC tablet, Take 325 mg by mouth daily., Disp: , Rfl:  .  atorvastatin (LIPITOR) 10 MG tablet, Take 1 tablet (10 mg total) by mouth daily., Disp: 90 tablet, Rfl: 3 .  cetirizine (ZYRTEC ALLERGY) 10 MG tablet, Take by mouth., Disp: , Rfl:   Current Facility-Administered Medications:  .  cyanocobalamin ((VITAMIN B-12)) injection 1,000 mcg, 1,000 mcg, Intramuscular, Once, Khan, Fozia M, MD   Physical exam:  Vitals:   11/27/17 1325  BP: 121/86  Pulse: 93  Resp: 16  Temp: 98.5 F (36.9 C)  TempSrc: Tympanic  SpO2: 98%  Weight: 212 lb (96.2 kg)  Height: 5' (1.524 m)   Physical Exam  Constitutional: She is oriented to person, place, and time.  Pt is short and obese. Does not appear to be in any acute distress  HENT:  Head: Normocephalic and atraumatic.  Eyes: EOM are normal. Pupils are equal, round, and reactive to light.  Neck: Normal range of motion.  Cardiovascular: Normal rate, regular rhythm and normal heart sounds.  Pulmonary/Chest: Effort normal and breath sounds normal.  Abdominal: Soft. Bowel sounds are normal.  Neurological: She is alert and oriented to  person, place, and time.  Skin: Skin is warm and dry.       CMP Latest Ref Rng & Units 11/09/2017  Glucose 65 - 99 mg/dL 108(H)  BUN 6 - 20 mg/dL 13  Creatinine 0.44 - 1.00 mg/dL 0.76  Sodium 135 - 145 mmol/L 136  Potassium 3.5 - 5.1 mmol/L 3.4(L)  Chloride 101 - 111 mmol/L 105  CO2 22 - 32 mmol/L 22  Calcium 8.9 - 10.3 mg/dL 8.8(L)  Total Protein 6.5 - 8.1 g/dL 7.4  Total Bilirubin 0.3 - 1.2 mg/dL 0.9  Alkaline Phos 38 - 126 U/L 54  AST 15 - 41 U/L 28  ALT 14 - 54 U/L 33   CBC Latest Ref Rng & Units 11/09/2017  WBC 3.6 - 11.0 K/uL 10.5  Hemoglobin 12.0 - 16.0 g/dL 10.8(L)  Hematocrit 35.0 - 47.0 % 35.0  Platelets 150 - 440 K/uL 379      No images are attached to the encounter.  Ct Angio Head W Or Wo Contrast  Result Date: 11/09/2017 CLINICAL DATA:  37-year-old female with code stroke presentation. Symptoms after exercising at the gym. Sudden onset left arm numbness EXAM: CT ANGIOGRAPHY HEAD AND NECK TECHNIQUE: Multidetector CT imaging of the head and neck was performed using the standard protocol during bolus administration of intravenous contrast. Multiplanar CT image reconstructions and MIPs were obtained to evaluate the vascular anatomy. Carotid stenosis measurements (when applicable) are obtained utilizing NASCET criteria, using the distal internal carotid diameter as the denominator. CONTRAST:  75mL ISOVUE-370 IOPAMIDOL (ISOVUE-370) INJECTION 76% COMPARISON:  Noncontrast head CT 1244 hr today. FINDINGS: CTA NECK Skeleton: Nonspecific reversal of cervical lordosis. No acute osseous abnormality identified. Paranasal sinuses and mastoids are clear. Upper chest: Negative visualized upper chest. Normal superior mediastinum. Other neck: Negative.  No cervical lymphadenopathy. Aortic arch: 3 vessel arch configuration. No arch atherosclerosis or great vessel origin stenosis. Right carotid system: Negative. Left carotid system: Negative. Vertebral arteries: Normal proximal right subclavian  artery and right vertebral artery origin. The right vertebral is normal to the skull base. Normal proximal left subclavian artery and left vertebral artery origin. The vertebral arteries are codominant in the neck, and the left is normal to the skull base. CTA HEAD Posterior circulation: Normal distal vertebral arteries and vertebrobasilar junction. The a ICAs appear dominant. No basilar stenosis. SCA and PCA origins are normal. The right posterior communicating artery is present while the left is diminutive or absent. Bilateral PCA branches are within normal limits. Anterior circulation: Both ICA siphons are patent with no atherosclerosis or stenosis identified. Ophthalmic artery and right posterior communicating artery origins are normal. Patent carotid termini. Normal MCA and ACA origins. Diminutive or absent anterior communicating artery. Bilateral ACA is are within normal limits. Left MCA M1 segment, bifurcation, and left MCA branches are within normal limits. Right MCA M1 segment, bifurcation, and right MCA branches are within normal limits. Venous sinuses: Patent. Anatomic variants: None. Delayed phase: No abnormal enhancement identified. Stable gray-white matter differentiation throughout the brain. Review of the MIP images confirms the above findings IMPRESSION: 1. Negative CTA head and neck. No large vessel occlusion. No atherosclerosis or stenosis identified. 2. Stable and negative CT appearance of the brain. Electronically Signed   By: H  Hall M.D.   On: 11/09/2017 13:53   Ct Angio Neck W Or Wo Contrast  Result Date: 11/09/2017 CLINICAL DATA:  37-year-old female with code stroke presentation. Symptoms after exercising at the gym. Sudden onset left arm numbness EXAM: CT ANGIOGRAPHY HEAD AND NECK TECHNIQUE: Multidetector CT imaging of the head and neck was performed using the standard protocol during bolus administration of intravenous contrast. Multiplanar CT image reconstructions and MIPs were  obtained to evaluate the vascular anatomy. Carotid stenosis measurements (when applicable) are obtained utilizing NASCET criteria, using the distal internal carotid diameter as the denominator. CONTRAST:  75mL ISOVUE-370 IOPAMIDOL (ISOVUE-370) INJECTION 76% COMPARISON:  Noncontrast head CT 1244 hr today. FINDINGS: CTA NECK Skeleton: Nonspecific reversal of cervical lordosis. No acute osseous abnormality identified. Paranasal sinuses and mastoids are clear. Upper chest: Negative visualized upper chest. Normal superior mediastinum. Other neck: Negative.  No cervical lymphadenopathy. Aortic arch: 3 vessel arch configuration. No arch atherosclerosis or great vessel origin stenosis. Right carotid system: Negative. Left carotid system: Negative. Vertebral arteries: Normal proximal right subclavian artery and right vertebral artery origin. The right vertebral is normal to the skull base. Normal proximal left subclavian artery and left vertebral   artery origin. The vertebral arteries are codominant in the neck, and the left is normal to the skull base. CTA HEAD Posterior circulation: Normal distal vertebral arteries and vertebrobasilar junction. The a ICAs appear dominant. No basilar stenosis. SCA and PCA origins are normal. The right posterior communicating artery is present while the left is diminutive or absent. Bilateral PCA branches are within normal limits. Anterior circulation: Both ICA siphons are patent with no atherosclerosis or stenosis identified. Ophthalmic artery and right posterior communicating artery origins are normal. Patent carotid termini. Normal MCA and ACA origins. Diminutive or absent anterior communicating artery. Bilateral ACA is are within normal limits. Left MCA M1 segment, bifurcation, and left MCA branches are within normal limits. Right MCA M1 segment, bifurcation, and right MCA branches are within normal limits. Venous sinuses: Patent. Anatomic variants: None. Delayed phase: No abnormal  enhancement identified. Stable gray-white matter differentiation throughout the brain. Review of the MIP images confirms the above findings IMPRESSION: 1. Negative CTA head and neck. No large vessel occlusion. No atherosclerosis or stenosis identified. 2. Stable and negative CT appearance of the brain. Electronically Signed   By: H  Hall M.D.   On: 11/09/2017 13:53   Mr Brain Wo Contrast  Result Date: 11/10/2017 CLINICAL DATA:  Left-sided numbness including the left side of the face beginning 24 hours ago. EXAM: MRI HEAD WITHOUT CONTRAST TECHNIQUE: Multiplanar, multiecho pulse sequences of the brain and surrounding structures were obtained without intravenous contrast. COMPARISON:  CT examinations 11/09/2017 FINDINGS: Brain: Diffusion imaging shows a subcentimeter acute infarction at the base of the brain on the right, most consistent with the cortical spinal tract in location. There could be some involvement of the lateral thalamus. Elsewhere, the brain has a normal appearance. No other small or large vessel infarction. No mass lesion, hemorrhage, hydrocephalus or extra-axial collection. Vascular: Major vessels at the base of the brain show flow. Skull and upper cervical spine: Negative Sinuses/Orbits: Clear/normal Other: None IMPRESSION: Subcentimeter acute infarction at the base of the brain on the right consistent with location in the corticospinal tract, possibly with minor involvement of the lateral thalamus. No evidence of swelling or hemorrhage. Otherwise normal study. Electronically Signed   By: Mark  Shogry M.D.   On: 11/10/2017 13:01   Ct Head Code Stroke Wo Contrast  Result Date: 11/09/2017 CLINICAL DATA:  Code stroke. Paresthesia. Numbness and tingling left side EXAM: CT HEAD WITHOUT CONTRAST TECHNIQUE: Contiguous axial images were obtained from the base of the skull through the vertex without intravenous contrast. COMPARISON:  None. FINDINGS: Brain: No evidence of acute infarction, hemorrhage,  hydrocephalus, extra-axial collection or mass lesion/mass effect. Vascular: Negative for vascular thrombosis Skull: Negative Sinuses/Orbits: Negative Other: None ASPECTS (Alberta Stroke Program Early CT Score) - Ganglionic level infarction (caudate, lentiform nuclei, internal capsule, insula, M1-M3 cortex): 7 - Supraganglionic infarction (M4-M6 cortex): 3 Total score (0-10 with 10 being normal): 10 IMPRESSION: 1. Negative CT head 2. ASPECTS is 10 3. These results were called by telephone at the time of interpretation on 11/09/2017 at 12:58 pm to Dr. Williams, who verbally acknowledged these results. Electronically Signed   By: Charles  Clark M.D.   On: 11/09/2017 12:58    Assessment and plan- Patient is a 37 y.o. female with following issues:  1.  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 These levels are similar to what she had back in 1996 and I therefore do   not think that she is overtly more anemic at this time as compared to her baseline. 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  2.  B12 deficiency: Recent B12 levels were 283 which are near normal and typically levels less than 240 are consistent with B12 deficiency.  Today I will repeat her CBC with differential along with B12 and methylmalonic acid level and if she has low levels of B12 we will treated accordingly  3.  Recent acute stroke: Hypercoagulable workup has been negative.  In terms of antiphospholipid antibody syndrome workup she has not had beta-2 glycoprotein checked and I will check that today.  She had factor V Leiden testing done but did not have prothrombin gene mutation testing done which I would check today although genetic mutations like these do not typically play a role in arterial events such as TIAs.  Her ESR was normal on 11/10/2017 that argues against active vasculitis causing a TIA.  I will check ANA  comprehensive panel today. I do not think Hb E would be a risk factor for her acute stroke  I will see her back in 2 weeks time to discuss the results of her blood work and further management  Thank you for this kind referral and the opportunity to participate in the care of this patient   Visit Diagnosis 1. TIA (transient ischemic attack)   2. Microcytic anemia   3. B12 deficiency     Dr. Archana Rao, MD, MPH [facility] Pager- [phone] 11/27/2017  1:57 PM                 

## 2017-11-28 LAB — ANA COMPREHENSIVE PANEL
Anti JO-1: <0.2 AI (ref 0.0–0.9)
Ribonucleic Protein: 0.2 AI (ref 0.0–0.9)
Scleroderma (Scl-70) (ENA) Antibody, IgG: <0.2 AI (ref 0.0–0.9)
ds DNA Ab: <1 IU/mL (ref 0–9)

## 2017-11-29 LAB — BETA-2-GLYCOPROTEIN I ABS, IGG/M/A
Beta-2-Glycoprotein I IgA: <9 GPI IgA units (ref 0–25)
Beta-2-Glycoprotein I IgM: <9 GPI IgM units (ref 0–32)

## 2017-11-30 ENCOUNTER — Encounter: Payer: Self-pay | Admitting: Oncology

## 2017-11-30 LAB — METHYLMALONIC ACID, SERUM: Methylmalonic Acid, Quantitative: 168 nmol/L (ref 0–378)

## 2017-12-01 DIAGNOSIS — R200 Anesthesia of skin: Secondary | ICD-10-CM | POA: Diagnosis not present

## 2017-12-01 DIAGNOSIS — I639 Cerebral infarction, unspecified: Secondary | ICD-10-CM | POA: Diagnosis not present

## 2017-12-02 ENCOUNTER — Other Ambulatory Visit (INDEPENDENT_AMBULATORY_CARE_PROVIDER_SITE_OTHER): Payer: BLUE CROSS/BLUE SHIELD | Admitting: Internal Medicine

## 2017-12-02 DIAGNOSIS — R519 Headache, unspecified: Secondary | ICD-10-CM | POA: Insufficient documentation

## 2017-12-02 DIAGNOSIS — R51 Headache: Secondary | ICD-10-CM | POA: Diagnosis not present

## 2017-12-02 DIAGNOSIS — G4710 Hypersomnia, unspecified: Secondary | ICD-10-CM | POA: Diagnosis not present

## 2017-12-02 DIAGNOSIS — I639 Cerebral infarction, unspecified: Secondary | ICD-10-CM | POA: Diagnosis not present

## 2017-12-03 LAB — PROTHROMBIN GENE MUTATION

## 2017-12-11 ENCOUNTER — Inpatient Hospital Stay (HOSPITAL_BASED_OUTPATIENT_CLINIC_OR_DEPARTMENT_OTHER): Payer: BLUE CROSS/BLUE SHIELD | Admitting: Oncology

## 2017-12-11 ENCOUNTER — Encounter: Payer: Self-pay | Admitting: Oncology

## 2017-12-11 VITALS — BP 116/81 | HR 82 | Temp 97.9°F | Resp 18 | Ht 60.0 in | Wt 217.0 lb

## 2017-12-11 DIAGNOSIS — D582 Other hemoglobinopathies: Secondary | ICD-10-CM | POA: Diagnosis not present

## 2017-12-11 DIAGNOSIS — G459 Transient cerebral ischemic attack, unspecified: Secondary | ICD-10-CM

## 2017-12-11 DIAGNOSIS — E538 Deficiency of other specified B group vitamins: Secondary | ICD-10-CM

## 2017-12-11 DIAGNOSIS — D509 Iron deficiency anemia, unspecified: Secondary | ICD-10-CM

## 2017-12-11 DIAGNOSIS — Z87891 Personal history of nicotine dependence: Secondary | ICD-10-CM | POA: Diagnosis not present

## 2017-12-11 NOTE — Progress Notes (Signed)
Patient c/o mild headache behind left eye

## 2017-12-14 NOTE — Progress Notes (Signed)
Hematology/Oncology Consult note [facility]  [phone]) [phone] [phone]  [facility] [place] [facility] - [facility]   Name of the patient: Natasha BoschDorothy Hollars  [id number]  [birth year]   Date of visit: 12/14/17  Diagnosis- TIA at young age Microcytic anemia  Chief complaint/ Reason for visit- discuss bloodwork results  Heme/Onc history: patient is a 37 year old [place] female who was recently admitted to the hospital on 11/11/2017 for TIA and left-sided numbness.  She was seen by neurology and underwent MRI as well as CTA of the head and neck.  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  CTA of the head and neck was negative.  Echocardiogram and TEE was unremarkable.  As a part of her stroke workup she had protein C, protein S and Antithrombin III levels done which were unremarkable.  Factor V Leiden mutation testing was negative.  Homocystine levels were checked and were normal.  Iron studies were unremarkable.  TSH was normal and B12 levels were borderline at 283.  CBC from 11/09/2017 showed WBC of 10.5, H&H of 10.8/35 with an MCV of 52 and a platelet count of 379.  HIV testing was negative.  Patient has had hemoglobin electrophoresis back in 1996 which was consistent with hemoglobin AE hemoglobin A 80: E 20.  Fetal hemoglobin was normal at 1%  Patient denies any prior history of DVT or PE.  No family history of thrombosis other than her maternal grandmother who probably had a stroke when she was very old.  She is G2P2 L2 and had one miscarriage at 3 months prior to her first pregnancy.  She is currently not on any birth control and has had tubal ligation.  She will be seeing neurology next week.  Family history significant for hemoglobin E in her siblings.  Her sister also known to have sickle cell trait.  She is  currently doing well and denies any complaints  Blood work from 11/27/2017 was as follows: CBC showed white count of 8, H&H of 10.8/34.9 with an MCV of 52.5 and a platelet count of 393.  B12 level was normal at 431 and folic acid level was normal at 160.  ANA with comprehensive panel did not reveal any evidence of vasculitis.  Prothrombin gene mutation was negative.  Beta-2 glycoprotein was unremarkable.  Interval history- she currently reports a mild headache. She saw neurology and was prescribed medication for her migraine which she has not started taking yet  ECOG PS- 0 Pain scale- 0  Review of systems- Review of Systems  Constitutional: Negative for chills, fever, malaise/fatigue and weight loss.  HENT: Negative for congestion, ear discharge and nosebleeds.   Eyes: Negative for blurred vision.  Respiratory: Negative for cough, hemoptysis, sputum production, shortness of breath and wheezing.   Cardiovascular: Negative for chest pain, palpitations, orthopnea and claudication.  Gastrointestinal: Negative for abdominal pain, blood in stool, constipation, diarrhea, heartburn, melena, nausea and vomiting.  Genitourinary: Negative for dysuria, flank pain, frequency, hematuria and urgency.  Musculoskeletal: Negative for back pain, joint pain and myalgias.  Skin: Negative for rash.  Neurological: Negative for dizziness, tingling, focal weakness, seizures, weakness and headaches.  Endo/Heme/Allergies: Does not bruise/bleed easily.  Psychiatric/Behavioral: Negative for depression and suicidal ideas. The patient does not have insomnia.       No Known Allergies   Past Medical History:  Diagnosis Date  . Hemoglobin E-A disorder (HCC)   . Hyperlipidemia   . Stroke [facility])      Past Surgical History:  Procedure Laterality Date  . CHOLECYSTECTOMY    . TEE WITHOUT CARDIOVERSION N/A 11/11/2017   Procedure: TRANSESOPHAGEAL ECHOCARDIOGRAM (TEE);  Surgeon: Dalia Heading, MD;  Location: [facility] ORS;   Service: Cardiovascular;  Laterality: N/A;  . TUBAL LIGATION    . WISDOM TOOTH EXTRACTION      Social History   Socioeconomic History  . Marital status: Single    Spouse name: Not on file  . Number of children: 2  . Years of education: Not on file  . Highest education level: Not on file  Occupational History  . Not on file  Social Needs  . Financial resource strain: Not hard at all  . Food insecurity:    Worry: Never true    Inability: Never true  . Transportation needs:    Medical: Yes    Non-medical: Yes  Tobacco Use  . Smoking status: Former Smoker    Packs/day: 0.25    Types: Cigarettes    Last attempt to quit: 11/09/2017    Years since quitting: 0.0  . Smokeless tobacco: Never Used  . Tobacco comment: approximately 4 cigarettes a day  Substance and Sexual Activity  . Alcohol use: No  . Drug use: No  . Sexual activity: Never  Lifestyle  . Physical activity:    Days per week: 3 days    Minutes per session: 50 min  . Stress: Not at all  Relationships  . Social connections:    Talks on phone: More than three times a week    Gets together: Once a week    Attends religious service: Never    Active member of club or organization: No    Attends meetings of clubs or organizations: Never    Relationship status: Not on file  . Intimate partner violence:    Fear of current or ex partner: Not on file    Emotionally abused: Not on file    Physically abused: Not on file    Forced sexual activity: Not on file  Other Topics Concern  . Not on file  Social History Narrative  . Not on file    Family History  Problem Relation Age of Onset  . Kidney failure Father   . Heart attack Father        open heart surgery  . Hypertension Father   . Diabetes Father   . Gout Father   . Cataracts Father      Current Outpatient Medications:  .  aspirin 325 MG EC tablet, Take 325 mg by mouth daily., Disp: , Rfl:  .  atorvastatin (LIPITOR) 10 MG tablet, Take 1 tablet (10 mg  total) by mouth daily., Disp: 90 tablet, Rfl: 3 .  cetirizine (ZYRTEC ALLERGY) 10 MG tablet, Take by mouth., Disp: , Rfl:   Current Facility-Administered Medications:  .  cyanocobalamin ((VITAMIN B-12)) injection 1,000 mcg, 1,000 mcg, Intramuscular, Once, Lyndon Code, MD  Physical exam:  Vitals:   12/11/17 0919  BP: 116/81  Pulse: 82  Resp: 18  Temp: 97.9 F (36.6 C)  TempSrc: Tympanic  SpO2: 98%  Weight: 217 lb (98.4 kg)  Height: 5' (1.524 m)   Physical Exam  Constitutional: She is oriented to person, place, and time and well-developed, well-nourished, and in no distress.  HENT:  Head: Normocephalic and atraumatic.  Eyes: Pupils are equal, round, and  reactive to light. EOM are normal.  Neck: Normal range of motion.  Cardiovascular: Normal rate, regular rhythm and normal heart sounds.  Pulmonary/Chest: Effort normal and breath sounds normal.  Abdominal: Soft. Bowel sounds are normal.  Neurological: She is alert and oriented to person, place, and time.  Skin: Skin is warm and dry.     CMP Latest Ref Rng & Units 11/09/2017  Glucose 65 - 99 mg/dL 829(F)  BUN 6 - 20 mg/dL 13  Creatinine 6.21 - 3.08 mg/dL 6.57  Sodium 846 - 962 mmol/L 136  Potassium 3.5 - 5.1 mmol/L 3.4(L)  Chloride 101 - 111 mmol/L 105  CO2 22 - 32 mmol/L 22  Calcium 8.9 - 10.3 mg/dL 9.5(M)  Total Protein 6.5 - 8.1 g/dL 7.4  Total Bilirubin 0.3 - 1.2 mg/dL 0.9  Alkaline Phos 38 - 126 U/L 54  AST 15 - 41 U/L 28  ALT 14 - 54 U/L 33   CBC Latest Ref Rng & Units 11/27/2017  WBC 3.6 - 11.0 K/uL 8.0  Hemoglobin 12.0 - 16.0 g/dL 10.8(L)  Hematocrit 35.0 - 47.0 % 34.9(L)  Platelets 150 - 400 K/uL 393    Assessment and plan- Patient is a 37 y.o. female with following medical issues:  1.  TIA of uncertain etiology: Extensive hypercoagulable workup including factor V Leiden, prothrombin gene mutation, protein C protein S, Antithrombin III, antiphospholipid antibody syndrome workup was negative.  Vasculitis  workup was also unremarkable.  She has been seen by neurology and is currently on low-dose aspirin.  No further workup or follow-up required from hematology standpoint.  2.  Microcytic anemia: Hemoglobin stable around 10 which has been her baseline for the past several years secondary to hemoglobin E disease  3.  B12 deficiency: Being followed by PCP.  Recent B12 and folic acid levels checked were normal  She does not require hematology follow-up at this time   Visit Diagnosis 1. Hemoglobin E-A disorder (HCC)   2. TIA (transient ischemic attack)      Dr. Owens Shark, MD, MPH [facility] Pager- [phone] 12/14/2017 9:38 AM

## 2017-12-21 ENCOUNTER — Ambulatory Visit: Payer: BLUE CROSS/BLUE SHIELD | Admitting: Internal Medicine

## 2017-12-21 ENCOUNTER — Encounter: Payer: Self-pay | Admitting: Internal Medicine

## 2017-12-21 VITALS — BP 110/80 | HR 69 | Resp 16 | Ht 60.0 in | Wt 219.0 lb

## 2017-12-21 DIAGNOSIS — J301 Allergic rhinitis due to pollen: Secondary | ICD-10-CM | POA: Diagnosis not present

## 2017-12-21 DIAGNOSIS — R0683 Snoring: Secondary | ICD-10-CM | POA: Diagnosis not present

## 2017-12-21 DIAGNOSIS — G4726 Circadian rhythm sleep disorder, shift work type: Secondary | ICD-10-CM

## 2017-12-21 DIAGNOSIS — I639 Cerebral infarction, unspecified: Secondary | ICD-10-CM | POA: Diagnosis not present

## 2017-12-21 DIAGNOSIS — G479 Sleep disorder, unspecified: Secondary | ICD-10-CM | POA: Diagnosis not present

## 2017-12-21 NOTE — Progress Notes (Signed)
[facility] [place], [place] [address]  Pulmonary Sleep Medicine   Office Visit Note  Patient Name: Natasha Moreno DOB: 01-16-1981 MRN [id number]  Date of Service: 12/21/2017  Complaints/HPI:  Patient is here after a sleep study.  Apparently had a stroke and so therefore there was a major concern as to will could be because of the stroke.  She had numbness in her arm numbness in her face.  This has pretty much resolved.  She was sent for the sleep study and a sleep study revealed that she does not have significant sleep apnea.  She does have significant snoring.  Also there is borderline desaturations the lowest saturation was 90%.  On further questioning as far as knowing is concerned she does admit to having allergy issues she has been having rhinitis stuffy nose.  She also notes that she sneezes a lot and the symptoms are actually getting worse now since she is in springtime.  She also works 3rd shift which is rotating an she does have difficulty with her sleeping secondary to the she for her disorder more than likely  ROS  General: (-) fever, (-) chills, (-) night sweats, (-) weakness Skin: (-) rashes, (-) itching,. Eyes: (-) visual changes, (-) redness, (-) itching. Nose and Sinuses: (-) nasal stuffiness or itchiness, (-) postnasal drip, (-) nosebleeds, (-) sinus trouble. Mouth and Throat: (-) sore throat, (-) hoarseness. Neck: (-) swollen glands, (-) enlarged thyroid, (-) neck pain. Respiratory: - cough, (-) bloody sputum, - shortness of breath, - wheezing. Cardiovascular: - ankle swelling, (-) chest pain. Lymphatic: (-) lymph node enlargement. Neurologic: (-) numbness, (-) tingling. Psychiatric: (-) anxiety, (-) depression   Current Medication: Outpatient Encounter Medications as of 12/21/2017  Medication Sig  . aspirin 81 MG tablet Take 81 mg by mouth daily.  Marland Kitchen atorvastatin (LIPITOR) 10 MG tablet Take 1 tablet (10 mg total) by mouth daily.  .  cetirizine (ZYRTEC ALLERGY) 10 MG tablet Take by mouth.  Marland Kitchen aspirin 325 MG EC tablet Take 325 mg by mouth daily.   Facility-Administered Encounter Medications as of 12/21/2017  Medication  . cyanocobalamin ((VITAMIN B-12)) injection 1,000 mcg    Surgical History: Past Surgical History:  Procedure Laterality Date  . CHOLECYSTECTOMY    . TEE WITHOUT CARDIOVERSION N/A 11/11/2017   Procedure: TRANSESOPHAGEAL ECHOCARDIOGRAM (TEE);  Surgeon: Teodoro Spray, MD;  Location: [facility] ORS;  Service: Cardiovascular;  Laterality: N/A;  . TUBAL LIGATION    . WISDOM TOOTH EXTRACTION      Medical History: Past Medical History:  Diagnosis Date  . 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 Hyperlipidemia   . Stroke [facility])     Family History: Family History  Problem Relation Age of Onset  . Kidney failure Father   . Heart attack Father        open heart surgery  . Hypertension Father   . Diabetes Father   . Gout Father   . Cataracts Father     Social History: Social History   Socioeconomic History  . Marital status: Single    Spouse name: Not on file  . Number of children: 2  . Years of education: Not on file  . Highest education level: Not on file  Occupational History  . Not on file  Social Needs  . Financial resource strain: Not hard at all  . Food insecurity:    Worry: Never true    Inability: Never true  . Transportation needs:  Medical: Yes    Non-medical: Yes  Tobacco Use  . Smoking status: Former Smoker    Packs/day: 0.25    Types: Cigarettes    Last attempt to quit: 11/09/2017    Years since quitting: 0.1  . Smokeless tobacco: Never Used  . Tobacco comment: approximately 4 cigarettes a day  Substance and Sexual Activity  . Alcohol use: No  . Drug use: No  . Sexual activity: Never  Lifestyle  . Physical activity:    Days per week: 3 days    Minutes per session: 50 min  . Stress: Not at all  Relationships  . Social connections:    Talks on phone: More than three times  a week    Gets together: Once a week    Attends religious service: Never    Active member of club or organization: No    Attends meetings of clubs or organizations: Never    Relationship status: Not on file  . Intimate partner violence:    Fear of current or ex partner: Not on file    Emotionally abused: Not on file    Physically abused: Not on file    Forced sexual activity: Not on file  Other Topics Concern  . Not on file  Social History Narrative  . Not on file    Vital Signs: Blood pressure 110/80, pulse 69, resp. rate 16, height 5' (1.524 m), weight 219 lb (99.3 kg), SpO2 97 %.  Examination: General Appearance: The patient is well-developed, well-nourished, and in no distress. Skin: Gross inspection of skin unremarkable. Head: normocephalic, no gross deformities. Eyes: no gross deformities noted. ENT: ears appear grossly normal no exudates. Neck: Supple. No thyromegaly. No LAD. Respiratory: clear no rhonchi. Cardiovascular: Normal S1 and S2 without murmur or rub. Extremities: No cyanosis. pulses are equal. Neurologic: Alert and oriented. No involuntary movements.  LABS: Recent Results (from the past 2160 hour(s))  Protime-INR     Status: None   Collection Time: 11/09/17 12:35 PM  Result Value Ref Range   Prothrombin Time 13.1 11.4 - 15.2 seconds   INR 1.00     Comment: Performed at [facility], [address] [place], [place] [address]  APTT     Status: None   Collection Time: 11/09/17 12:35 PM  Result Value Ref Range   aPTT 26 24 - 36 seconds    Comment: Performed at [facility], [address] [place], [place] [address]  CBC     Status: Abnormal   Collection Time: 11/09/17 12:35 PM  Result Value Ref Range   WBC 10.5 3.6 - 11.0 K/uL   RBC 6.72 (H) 3.80 - 5.20 MIL/uL   Hemoglobin 10.8 (L) 12.0 - 16.0 g/dL   HCT 35.0 35.0 - 47.0 %   MCV 52.0 (L) 80.0 - 100.0 fL   MCH 16.0 (L) 26.0 - 34.0 pg   MCHC 30.8 (L) 32.0 - 36.0 g/dL   RDW 17.6  (H) 11.5 - 14.5 %   Platelets 379 150 - 440 K/uL    Comment: PLATELET COUNT CONFIRMED BY SMEAR Performed at [facility], [address], [place], [place] [address]   Differential     Status: Abnormal   Collection Time: 11/09/17 12:35 PM  Result Value Ref Range   Neutrophils Relative % 66 %   Neutro Abs 6.9 (H) 1.4 - 6.5 K/uL   Lymphocytes Relative 26 %   Lymphs Abs 2.7 1.0 - 3.6 K/uL   Monocytes Relative 5 %  Monocytes Absolute 0.5 0.2 - 0.9 K/uL   Eosinophils Relative 2 %   Eosinophils Absolute 0.2 0 - 0.7 K/uL   Basophils Relative 1 %   Basophils Absolute 0.1 0 - 0.1 K/uL    Comment: Performed at [facility], [address] [place], [place] [address]  Comprehensive metabolic panel     Status: Abnormal   Collection Time: 11/09/17 12:35 PM  Result Value Ref Range   Sodium 136 135 - 145 mmol/L   Potassium 3.4 (L) 3.5 - 5.1 mmol/L   Chloride 105 101 - 111 mmol/L   CO2 22 22 - 32 mmol/L   Glucose, Bld 108 (H) 65 - 99 mg/dL   BUN 13 6 - 20 mg/dL   Creatinine, Ser 0.76 0.44 - 1.00 mg/dL   Calcium 8.8 (L) 8.9 - 10.3 mg/dL   Total Protein 7.4 6.5 - 8.1 g/dL   Albumin 4.1 3.5 - 5.0 g/dL   AST 28 15 - 41 U/L   ALT 33 14 - 54 U/L   Alkaline Phosphatase 54 38 - 126 U/L   Total Bilirubin 0.9 0.3 - 1.2 mg/dL   GFR calc non Af Amer >60 >60 mL/min   GFR calc Af Amer >60 >60 mL/min    Comment: (NOTE) The eGFR has been calculated using the CKD EPI equation. This calculation has not been validated in all clinical situations. eGFR's persistently <60 mL/min signify possible Chronic Kidney Disease.    Anion gap 9 5 - 15    Comment: Performed at [facility], [address] [place], [place] [address]  Troponin I     Status: None   Collection Time: 11/09/17 12:35 PM  Result Value Ref Range   Troponin I <0.03 <0.03 ng/mL    Comment: Performed at [facility], [address] [place], [place] [address]  Glucose, capillary     Status: Abnormal    Collection Time: 11/09/17  1:03 PM  Result Value Ref Range   Glucose-Capillary 115 (H) 65 - 99 mg/dL  HIV antibody (Routine Testing)     Status: None   Collection Time: 11/10/17  5:30 AM  Result Value Ref Range   HIV Screen 4th Generation wRfx Non Reactive Non Reactive    Comment: (NOTE) Performed At: [facility] [place], [place] [id number] Rush Farmer MD [id number] Performed at [facility], [address] [place], [place] [address]   Hemoglobin A1c     Status: Abnormal   Collection Time: 11/10/17  5:30 AM  Result Value Ref Range   Hgb A1c MFr Bld 4.5 (L) 4.8 - 5.6 %    Comment: (NOTE) Pre diabetes:          5.7%-6.4% Diabetes:              >6.4% Glycemic control for   <7.0% adults with diabetes    Mean Plasma Glucose 82.45 mg/dL    Comment: Performed at [facility], [place] [address] [place], [place] [address]  Lipid panel     Status: None   Collection Time: 11/10/17  5:30 AM  Result Value Ref Range   Cholesterol 109 0 - 200 mg/dL   Triglycerides 63 <150 mg/dL   HDL 47 >40 mg/dL   Total CHOL/HDL Ratio 2.3 RATIO   VLDL 13 0 - 40 mg/dL   LDL Cholesterol 49 0 - 99 mg/dL    Comment:        Total Cholesterol/HDL:CHD Risk Coronary Heart Disease  Risk Table                     Men   Women  1/2 Average Risk   3.4   3.3  Average Risk       5.0   4.4  2 X Average Risk   9.6   7.1  3 X Average Risk  23.4   11.0        Use the calculated Patient Ratio above and the CHD Risk Table to determine the patient's CHD Risk.        ATP III CLASSIFICATION (LDL):  <100     mg/dL   Optimal  100-129  mg/dL   Near or Above                    Optimal  130-159  mg/dL   Borderline  160-189  mg/dL   High  >190     mg/dL   Very High Performed at [facility], [address] [place], [place] [address]   ECHOCARDIOGRAM COMPLETE     Status: None   Collection Time: 11/10/17  9:20 AM  Result Value Ref Range   Weight 3,376 oz   Height  60 in   BP 107/63 mmHg  Urine Drug Screen, Qualitative ([facility] only)     Status: None   Collection Time: 11/10/17  1:12 PM  Result Value Ref Range   Tricyclic, Ur Screen NONE DETECTED NONE DETECTED   Amphetamines, Ur Screen NONE DETECTED NONE DETECTED   MDMA (Ecstasy)Ur Screen NONE DETECTED NONE DETECTED   Cocaine Metabolite,Ur [place] NONE DETECTED NONE DETECTED   Opiate, Ur Screen NONE DETECTED NONE DETECTED   Phencyclidine (PCP) Ur S NONE DETECTED NONE DETECTED   Cannabinoid 50 Ng, Ur [place] NONE DETECTED NONE DETECTED   Barbiturates, Ur Screen NONE DETECTED NONE DETECTED   Benzodiazepine, Ur Scrn NONE DETECTED NONE DETECTED   Methadone Scn, Ur NONE DETECTED NONE DETECTED    Comment: (NOTE) Tricyclics + metabolites, urine    Cutoff 1000 ng/mL Amphetamines + metabolites, urine  Cutoff 1000 ng/mL MDMA (Ecstasy), urine              Cutoff 500 ng/mL Cocaine Metabolite, urine          Cutoff 300 ng/mL Opiate + metabolites, urine        Cutoff 300 ng/mL Phencyclidine (PCP), urine         Cutoff 25 ng/mL Cannabinoid, urine                 Cutoff 50 ng/mL Barbiturates + metabolites, urine  Cutoff 200 ng/mL Benzodiazepine, urine              Cutoff 200 ng/mL Methadone, urine                   Cutoff 300 ng/mL The urine drug screen provides only a preliminary, unconfirmed analytical test result and should not be used for non-medical purposes. Clinical consideration and professional judgment should be applied to any positive drug screen result due to possible interfering substances. A more specific alternate chemical method must be used in order to obtain a confirmed analytical result. Gas chromatography / mass spectrometry (GC/MS) is the preferred confirmat ory method. Performed at [facility], [address] [place], [place] [address]   TSH     Status: None   Collection Time: 11/10/17  2:25 PM  Result Value Ref Range   TSH 2.121  0.350 - 4.500 uIU/mL    Comment: Performed by a 3rd  Generation assay with a functional sensitivity of <=0.01 uIU/mL. Performed at [facility], [address] [place], [place] [address]   Vitamin B12     Status: None   Collection Time: 11/10/17  2:25 PM  Result Value Ref Range   Vitamin B-12 283 180 - 914 pg/mL    Comment: (NOTE) This assay is not validated for testing neonatal or myeloproliferative syndrome specimens for Vitamin B12 levels. Performed at [facility], [place] [address] [place], [place] [address]   VITAMIN D 25 Hydroxy (Vit-D Deficiency, Fractures)     Status: Abnormal   Collection Time: 11/10/17  2:25 PM  Result Value Ref Range   Vit D, 25-Hydroxy 16.8 (L) 30.0 - 100.0 ng/mL    Comment: (NOTE) Vitamin D deficiency has been defined by the Institute of Medicine and an Endocrine Society practice guideline as a level of serum 25-OH vitamin D less than 20 ng/mL (1,2). The Endocrine Society went on to further define vitamin D insufficiency as a level between 21 and 29 ng/mL (2). 1. IOM (Institute of Medicine). 2010. Dietary reference   intakes for calcium and D. Lane: The   [employer]. 2. Holick MF, Binkley [place], Bischoff-Ferrari HA, et al.   Evaluation, treatment, and prevention of vitamin D   deficiency: an Endocrine Society clinical practice   guideline. JCEM. 2011 Jul; 96(7):1911-30. Performed At: [facility] [place], [place] [id number] Rush Farmer MD [id number] Performed at [facility], [address] [place], [place] [address]   Sedimentation rate     Status: None   Collection Time: 11/10/17  2:25 PM  Result Value Ref Range   Sed Rate 8 0 - 20 mm/hr    Comment: Performed at [facility], [address] [place], [place] [address]  Cardiolipin antibodies, IgG, IgM, IgA     Status: None   Collection Time: 11/10/17  2:25 PM  Result Value Ref Range   Anticardiolipin IgG <9 0 - 14 GPL U/mL    Comment: (NOTE)                           Negative:              <15                          Indeterminate:     15 - 20                          Low-Med Positive: >20 - 80                          High Positive:         >80    Anticardiolipin IgM <9 0 - 12 MPL U/mL    Comment: (NOTE)                          Negative:              <13                          Indeterminate:     13 - 20  Low-Med Positive: >20 - 80                          High Positive:         >80    Anticardiolipin IgA <9 0 - 11 APL U/mL    Comment: (NOTE)                          Negative:              <12                          Indeterminate:     12 - 20                          Low-Med Positive: >20 - 80                          High Positive:         >80 Performed At: [facility] [place], [place] [id number] Rush Farmer MD [id number] Performed at [facility], [address] [place], [place] [address]   Homocysteine     Status: None   Collection Time: 11/10/17  2:25 PM  Result Value Ref Range   Homocysteine 10.7 0.0 - 15.0 umol/L    Comment: (NOTE) Performed At: [facility] [place], [place] [id number] Rush Farmer MD [id number] Performed at [facility], [address] [place], [place] [address]   Antithrombin III     Status: None   Collection Time: 11/10/17  2:25 PM  Result Value Ref Range   AntiThromb III Func 100 75 - 120 %    Comment: Performed at [facility] [address] [place], [place] [address]  Factor 5 leiden     Status: None   Collection Time: 11/10/17  2:25 PM  Result Value Ref Range   Recommendations-F5LEID: Comment     Comment: (NOTE) Result:  Negative (no mutation found) Factor V Leiden is a specific mutation ([id number]) in the factor V gene that is associated with an increased risk of venous thrombosis. Factor V Leiden is more resistant to inactivation by activated protein C.  As a result, factor  V persists in the circulation leading to a mild hyper- coagulable state.  The Leiden mutation accounts for 90% - 95% of APC resistance.  Factor V Leiden has been reported in patients with deep vein thrombosis, pulmonary embolus, central retinal vein occlusion, cerebral sinus thrombosis and hepatic vein thrombosis. Other risk factors to be considered in the workup for venous thrombosis include the [id number] mutation in the factor II (prothrombin) gene, protein S and C deficiency, and antithrombin deficiencies. Anticardiolipin antibody and lupus anticoagulant analysis may be appropriate for certain patients, as well as homocysteine levels. Contact your local [facility] for information on how to order additi onal testing if desired. **Genetic counselors are available for health care providers to**  discuss results at 1-800-345-GENE [phone]). Methodology: DNA analysis of the Factor V gene was performed by allele-specific PCR. The diagnostic sensitivity and specificity is >99% for both. Molecular-based testing is highly accurate, but as in any laboratory test, diagnostic errors may occur. All test results must be combined with clinical information for the most accurate interpretation. This test was developed and its performance  characteristics determined by [facility]. It has not been cleared or approved by the Food and Drug Administration. References: Voelkerding K (1996).  Clin Lab Med [phone]. Allison Quarry, PhD, [facility] Ruben Reason, PhD, [facility] Annetta Maw, M.S., PhD, [facility] Alfredo Bach, PhD, [facility] Norva Riffle, PhD, [facility] Earlean Polka PhD, [facility] Performed At: [facility] [address] [place], [place] [id number] Nechama Guard MD [id number] Performed at [facility], [address] [place], [place] [address]   Lupus anticoagulant panel     Status: None   Collection Time: 11/10/17  2:25 PM  Result Value Ref Range   PTT Lupus Anticoagulant  31.3 0.0 - 51.9 sec   DRVVT 31.9 0.0 - 47.0 sec   Lupus Anticoag Interp Comment:     Comment: (NOTE) No lupus anticoagulant was detected. Performed At: [facility] [place], [place] [id number] Rush Farmer MD [id number] Performed at [facility], [address] [place], [place] [address]   Protein S, total     Status: None   Collection Time: 11/10/17  2:25 PM  Result Value Ref Range   Protein S Ag, Total 66 60 - 150 %    Comment: (NOTE) This test was developed and its performance characteristics determined by [facility]. It has not been cleared or approved by the Food and Drug Administration. Performed At: [facility] [place], [place] [id number] Rush Farmer MD [id number] Performed at [facility], [address] [place], [place] [address]   Protein C, total     Status: None   Collection Time: 11/10/17  2:25 PM  Result Value Ref Range   Protein C, Total 74 60 - 150 %    Comment: (NOTE) Performed At: [facility] [place], [place] [id number] Rush Farmer MD [id number] Performed at [facility], [address] [place], [place] [address]   Fe+TIBC+Fer     Status: Abnormal   Collection Time: 11/17/17  3:52 PM  Result Value Ref Range   Total Iron Binding Capacity 323 250 - 450 ug/dL   UIBC 237 131 - 425 ug/dL   Iron 86 27 - 159 ug/dL   Iron Saturation 27 15 - 55 %   Ferritin 151 (H) 15 - 150 ng/mL  Vitamin B12     Status: None   Collection Time: 11/27/17  2:00 PM  Result Value Ref Range   Vitamin B-12 431 180 - 914 pg/mL    Comment: (NOTE) This assay is not validated for testing neonatal or myeloproliferative syndrome specimens for Vitamin B12 levels. Performed at [facility], [place] [address], [place], [place] [address]   CBC     Status: Abnormal   Collection Time: 11/27/17  2:18 PM  Result Value Ref Range   WBC 8.0 3.6 - 11.0 K/uL   RBC 6.66 (H)  3.80 - 5.20 MIL/uL   Hemoglobin 10.8 (L) 12.0 - 16.0 g/dL   HCT 34.9 (L) 35.0 - 47.0 %   MCV 52.5 (L) 80.0 - 100.0 fL   MCH 16.3 (L) 26.0 - 34.0 pg   MCHC 31.0 (L) 32.0 - 36.0 g/dL   RDW 17.1 (H) 11.5 - 14.5 %   Platelets 393 150 - 400 K/uL    Comment: PLATELET COUNT CONFIRMED BY SMEAR Performed at [facility], [address] [place],  [address]   Methylmalonic acid, serum     Status: None   Collection Time: 11/27/17  2:18  PM  Result Value Ref Range   Methylmalonic Acid, Quantitative 168 0 - 378 nmol/L   Disclaimer: Comment     Comment: (NOTE) This test was developed and its performance characteristics determined by [facility]. It has not been cleared or approved by the Food and Drug Administration. Performed At: [facility] [place], [place] [id number] Rush Farmer MD [id number] Performed at [facility], [address] [place], [place] [address]   ANA Comprehensive Panel     Status: None   Collection Time: 11/27/17  2:18 PM  Result Value Ref Range   ds DNA Ab <1 0 - 9 IU/mL    Comment: (NOTE)                                   Negative      <5                                   Equivocal  5 - 9                                   Positive      >9    Ribonucleic Protein 0.2 0.0 - 0.9 AI   ENA SM Ab Ser-aCnc <0.2 0.0 - 0.9 AI   Scleroderma (Scl-70) (ENA) Antibody, IgG <0.2 0.0 - 0.9 AI   SSA (Ro) (ENA) Antibody, IgG <0.2 0.0 - 0.9 AI   SSB ([place]) (ENA) Antibody, IgG <0.2 0.0 - 0.9 AI   Chromatin Ab SerPl-aCnc <0.2 0.0 - 0.9 AI   Anti JO-1 <0.2 0.0 - 0.9 AI   Centromere Ab Screen <0.2 0.0 - 0.9 AI   See below: Comment     Comment: (NOTE) Autoantibody                       Disease Association ------------------------------------------------------------                        Condition                  Frequency ---------------------   ------------------------   --------- Antinuclear Antibody,    SLE, mixed connective Direct  (ANA-D)           tissue diseases ---------------------   ------------------------   --------- dsDNA                    SLE                        40 - 60% ---------------------   ------------------------   --------- Chromatin                Drug induced SLE                90%                         SLE                        48 - 97% ---------------------   ------------------------   --------- SSA (Ro)  SLE                        25 - 35%                         Sjogren's Syndrome         40 - 70%                         Neonatal Lupus                 100% ---------------------   ------------------------   --------- SSB ([place])                 SLE                              10%                         Sjogren's Syndrome              30% ---------------------   -----------------------    --------- Sm (anti-Smith)          SLE                        15 - 30% ---------------------   -----------------------    --------- RNP                      Mixed Connective Tissue                         Disease                         95% (U1 nRNP,                SLE                        30 - 50% anti-ribonucleoprotein)  Polymyositis and/or                         Dermatomyositis                 20% ---------------------   ------------------------   --------- Scl-70 (antiDNA          Scleroderma (diffuse)      20 - 35% topoisomerase)           Crest                           13% ---------------------   ------------------------   --------- Jo-1                     Polymyositis and/or                         Dermatomyositis            20 - 40% ---------------------   ------------------------   --------- Centromere B             Scleroderma -  Crest  variant                         80% Performed At: [facility] [place], [place] [id number] Rush Farmer MD [id number] Performed at [facility], [place],  [place] [address]   Beta-2-glycoprotein i abs, IgG/M/A     Status: None   Collection Time: 11/27/17  2:18 PM  Result Value Ref Range   Beta-2 Glyco I IgG <9 0 - 20 GPI IgG units    Comment: (NOTE) The reference interval reflects a 3SD or 99th percentile interval, which is thought to represent a potentially clinically significant result in accordance with the International Consensus Statement on the classification criteria for definitive antiphospholipid syndrome (APS). J Thromb Haem 2006;4:295-306.    Beta-2-Glycoprotein I IgM <9 0 - 32 GPI IgM units    Comment: (NOTE) The reference interval reflects a 3SD or 99th percentile interval, which is thought to represent a potentially clinically significant result in accordance with the International Consensus Statement on the classification criteria for definitive antiphospholipid syndrome (APS). J Thromb Haem 2006;4:295-306. Performed At: [facility] [place], [place] [id number] Rush Farmer MD LK:[id number]    Beta-2-Glycoprotein I IgA <9 0 - 25 GPI IgA units    Comment: (NOTE) The reference interval reflects a 3SD or 99th percentile interval, which is thought to represent a potentially clinically significant result in accordance with the International Consensus Statement on the classification criteria for definitive antiphospholipid syndrome (APS). J Thromb Haem 2006;4:295-306. Performed at [facility], [place] [place],  [address]   Prothrombin gene mutation     Status: None   Collection Time: 11/27/17  2:18 PM  Result Value Ref Range   Recommendations-PTGENE: Comment     Comment: (NOTE) NEGATIVE No mutation identified. Comment: A point mutation ([id number]) in the factor II (prothrombin) gene is the second most common cause of inherited thrombophilia. The incidence of this mutation in the [place] Caucasian population is about 2% and in the [place] American population it is approximately  0.5%. This mutation is rare in the [place] and Native American population. Being heterozygous for a prothrombin mutation increases the risk for developing venous thrombosis about 2 to 3 times above the general population risk. Being homozygous for the prothrombin gene mutation increases the relative risk for venous thrombosis further, although it is not yet known how much further the risk is increased. In women heterozygous for the prothrombin gene mutation, the use of estrogen containing oral contraceptives increases the relative risk of venous thrombosis about 16 times and the risk of developing cerebral thrombosis is also significantly increased. In pregnancy the pr othrombin gene mutation increases risk for venous thrombosis and may increase risk for stillbirth, placental abruption, pre-eclampsia and fetal growth restriction. If the patient possesses two or more congenital or acquired thrombophilic risk factors, the risk for thrombosis may rise to more than the sum of the risk ratios for the individual mutations. This assay detects only the prothrombin G20210A mutation and does not measure genetic abnormalities elsewhere in the genome. Other thrombotic risk factors may be pursued through systematic clinical laboratory analysis. These factors include the R506Q ([place]) mutation in the Factor V gene, plasma homocysteine levels, as well as testing for deficiencies of antithrombin III, protein C and protein S. Genetic Counselors are available for health care providers to discuss results at [phone]). Methodology: DNA analysis of the Factor II gene was performed  by PCR amplification followed by restriction analysis. The di agnostic sensitivity is >99% for both. All the tests must be combined with clinical information for the most accurate interpretation. Molecular-based testing is highly accurate, but as in any laboratory test, diagnostic errors may occur. This test was  developed and its performance characteristics determined by [facility]. It has not been cleared or approved by the Food and Drug Administration. Poort SR, et al. Blood. 1996; 20:2542-7062. Varga EA. Circulation. 2004; 376:E83-T51. Mervin Hack, et [place]; 19:700-703. Allison Quarry, PhD, [facility] Ruben Reason, PhD, [facility] Annetta Maw, M.S., PhD, [facility] Alfredo Bach, PhD, [facility] Norva Riffle, PhD, [facility] Earlean Polka, PhD, [facility] Performed At: [facility] [address] [place], [place] [id number] Nechama Guard MD GG:[id number] Performed at [facility], [address] [place], [place] [address]     Radiology: No results found.  No results found.  No results found.    Assessment and Plan: Patient Active Problem List   Diagnosis Date Noted  . Cerebrovascular accident (CVA) ([place]) 12/02/2017  . Headache disorder 12/02/2017  . Left sided numbness 11/09/2017  . 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 Stroke right now her symptoms have resolved will continue to monitor she will continue follow-up with Neurology as necessary 2. Snoring she does have significant snoring may be related to allergic rhinitis if the allergy evaluation is negative and she will have to be evaluated by ENT 3. Difficulty sleep likely secondary to she 4 crit disorder we will continue with supportive care reassurance was given 4. Shift Workers Disorder discussed proper sleep hygiene with her she is trying to get different job in the meantime 5. Allergic rhinitis will be set up for allergy testing  General Counseling: I have discussed the findings of the evaluation and examination with Earlie Server.  I have also discussed any further diagnostic evaluation thatmay be needed or ordered today. Sareena verbalizes understanding of the findings of todays visit. We also reviewed her medications today and discussed drug interactions and side effects  including but not limited excessive drowsiness and altered mental states. We also discussed that there is always a risk not just to her but also people around her. she has been encouraged to call the office with any questions or concerns that should arise related to todays visit.    Time spent: 18mn  I have personally obtained a history, examined the patient, evaluated laboratory and imaging results, formulated the assessment and plan and placed orders.    SAllyne Gee MD [facility] Sleep [facility]

## 2017-12-21 NOTE — Patient Instructions (Signed)
Allergy Test Why am I having this test? This test helps diagnose specific allergies that are causing different symptoms, such as:  Rashes.  Runny nose.  Sneezing.  Asthma flare-ups.  It is often used when skin testing for allergies is not an option. This test measures your level of immunoglobulin E (IgE). This determines exactly what substance you are allergic to. A common method used to measure IgE is the radioallergosorbent test (RAST) in which specific allergens are tested. You can be tested for some allergens, including:  Animal dander.  Food.  Pollens.  Dusts.  Latex.  Molds.  Insect venoms.  Medicines.  What kind of sample is taken? A blood sample is required for this test. It is usually collected by inserting a needle into a vein. How do I prepare for this test? There is no preparation required for this test. What are the reference ranges? Reference ranges are considered healthy ranges established after testing a large group of healthy people. Reference ranges may vary among different people, labs, and hospitals. It is your responsibility to obtain your test results. Ask the lab or department performing the test when and how you will get your results. Reference ranges for IgE are listed here:  Adult: 0-100 International Units/mL.  Child 0-23 months: 0-13 International Units/mL.  Child 2-5 years: 0-56 International Units/mL.  Child 6-10 years: 0-85 International Units/mL.  Reference ranges for RAST are listed here:  RAST rating 0: Less than 0.35 KU/L.  RAST rating 1: 0.35-0.69 KU/L.  RAST rating 2: 0.70-3.49 KU/L.  RAST rating 3: 3.50-17.49 KU/L.  RAST rating 4: 17.50-49.99 KU/L.  RAST rating 5: 50-100 KU/L.  RAST rating 6: Greater than 100 KU/L.  What do the results mean? A RAST rating of:  0 means you have an absent or undetectable allergen-specific IgE.  1 means you have a low level of allergen-specific IgE.  2 means you have a moderate  level of allergen-specific IgE.  3 means you have a high level of allergen-specific IgE.  4 means you have a very high level of allergen-specific IgE.  5 means you have a very high level of allergen-specific IgE.  6 means you have an extremely high level of allergen-specific IgE.  Talk with your health care provider to discuss your results, treatment options, and if necessary, the need for more tests. Talk with your health care provider if you have any questions about your results. Talk with your health care provider to discuss your results, treatment options, and if necessary, the need for more tests. Talk with your health care provider if you have any questions about your results. This information is not intended to replace advice given to you by your health care provider. Make sure you discuss any questions you have with your health care provider. Document Released: 09/30/2004 Document Revised: 05/12/2016 Document Reviewed: 02/10/2014 Elsevier Interactive Patient Education  2018 Elsevier Inc.  

## 2017-12-22 ENCOUNTER — Encounter: Payer: Self-pay | Admitting: Internal Medicine

## 2017-12-22 NOTE — Procedures (Signed)
[facility] [address] [place], [place] [id number]  Patient Name: Natasha BoschDorothy Moreno DOB: [birth year]   SLEEP STUDY INTERPRETATION  DATE OF SERVICE: December 02, 2017   SLEEP STUDY HISTORY: This patient is referred to the sleep lab for a baseline Polysomnography. Pertinent history includes a history of diagnosis of excessive daytime somnolence and snoring.  PROCEDURE: This overnight polysomnogram was performed using the Alice 5 acquisition system using the standard diagnostic protocol as outlined by the AASM. This includes 6 channels of EEG, 2 channelscannels of EOG, chin EMG, bilateral anterior tibialis EMG, nasal/oral thermister, PTAF, chest and abdominal wall movements, ECG and pulse oximetry. Apneas and Hypopneas were scored per AASM definition.  SLEEP ARCHITECHTURE: This is a baseline polysomnograph  study. The total recording time was  438 minutes and the patients total sleep time is noted to be  321.5 minutes. Sleep onset latency was  37.5 minutes and is  prolonged.  Stage R sleep onset latency was  171 minutes. Sleep maintenance efficiency was  73.4% and is Reduced.  Sleep staging expressed as a percentage of total sleep time demonstrated  7.9% N1,  57.1% N2 and  15.4% N3  sleep. Stage R represents  19.6% of total sleep time. This is  reduced.  There were a total of  30 arousals  for an overall arousal index of  5.6 per hour of sleep. PLMS arousal  Were not noted. Arousals without respiratory events are  noted. This can contribute to sleep architechture disruption.  RESPIRATORY MONITORING:   Patient exhibits  Some evidence of sleep disorderd breathing characterized by  1 central apneas,  no obstructive apneas and  no mixed apneas. There were  7 obstructive hypopneas and  5 RERAs. Most of the apneas/hypopneas were of  obstructive variety. The total apnea hypopnea index (apneas and hypopneas per hour of sleep) is  1.5 respiratory events per hour and is  Within normal  limits.  Respiratory monitoring demonstrated  moderate snoring through the night. There are a total of  674 snoring episodes representing  18.4% of sleep.   Baseline oxygen saturation during wakefulness was  97% and during NREM sleep averaged  97% through the night. Arterial saturation during REM sleep was  98% through the night. There  Was some significant  oxygen desaturation with the respiratory events. Arterial oxygen desaturation occurred of at least 4%  was noted with a low saturation of  90%. The study was performed  off oxygen.  CARDIAC MONITORING:   Average heart rate is  70 during sleep with a high of  85 beats per minute. Malignant arrhythmias  Were not noted.    IMPRESSIONS:  --This overnight polysomnogram demonstrates  Absence of significant sleep apnea with an overall AHI  1.5 per hour. --The overall AHI was  no worse  during Stage  REM. --There were associated  mild arterial oxygen desaturations noted  Down to 90% --There  Is no significant PLMS noted in this study. --There is  moderate snoring noted throughout the study.    RECOMMENDATIONS:  --CPAP titration study is  Not indicated in this case. --Nasal decongestants and antihistamines may be of help for increased upper airways resistance when present. --Weight loss through dietary and lifestyle modification is recommended in the presence of obesity. --A search for and treatment of any underlying cardiopulmonary disease is      recommended in the presence of oxygen desaturations. --Alternative treatment options if the patient is not willing to use CPAP include oral  appliances as well as surgical intervention which may help in the appropriate patient. --Clinical correlation is recommended. Please feel free to call the office for any further  questions or assistance in the care of this patient.     Yevonne Pax, MD [facility] Pulmonary Critical Care Medicine Sleep medicine

## 2017-12-22 NOTE — Patient Instructions (Signed)
Hypersomnia Hypersomnia is when you feel extremely tired during the day even though you're getting plenty of sleep at night. You may need to take naps during the day, and you may also be extremely difficult to wake up when you are sleeping. What are the causes? The cause of your hypersomnia may not be known. Hypersomnia may be caused by:  Medicines.  Sleep disorders, such as narcolepsy.  Trauma or injury to your head or nervous system.  Using drugs or alcohol.  Tumors.  Medical conditions, such as depression or hypothyroidism.  Genetics.  What are the signs or symptoms? The main symptoms of hypersomnia include:  Feeling extremely tired throughout the day.  Being very difficult to wake up.  Sleeping for longer and longer periods.  Taking naps throughout the day.  Other symptoms may include:  Feeling: ? Restless. ? Annoyed. ? Anxious. ? Low energy.  Having difficulty: ? Remembering. ? Speaking. ? Thinking.  Losing your appetite.  Experiencing hallucinations.  How is this diagnosed? Hypersomnia may be diagnosed by:  Medical history and physical exam. This will include a sleep history.  Completing sleep logs.  Tests may also be done, such as: ? Polysomnography. ? Multiple sleep latency test (MSLT).  How is this treated? There is no cure for hypersomnia, but treatment can be very effective in helping manage the condition. Treatment may include:  Lifestyle and sleeping strategies to help cope with the condition.  Stimulant medicines.  Treating any underlying causes of hypersomnia.  Follow these instructions at home:  Take medicines only as directed by your health care provider.  Schedule short naps for when you feel sleepiest during the day. Tell your employer or teachers that you have hypersomnia. You may be able to adjust your schedule to include time for naps.  Avoid drinking alcohol or caffeinated beverages.  Do not eat a heavy meal before  bedtime. Eat at about the same times every day.  Do not drive or operate heavy machinery if you are sleepy.  Do not swim or go out on the water without a life jacket.  If possible, adjust your schedule so that you do not have to work or be active at night.  Keep all follow-up visits as directed by your health care provider. This is important. Contact a health care provider if:  You have new symptoms.  Your symptoms get worse. Get help right away if: You have serious thoughts of hurting yourself or someone else. This information is not intended to replace advice given to you by your health care provider. Make sure you discuss any questions you have with your health care provider. Document Released: 08/29/2002 Document Revised: 02/14/2016 Document Reviewed: 04/13/2014 Elsevier Interactive Patient Education  2018 Elsevier Inc.  

## 2018-01-06 ENCOUNTER — Encounter: Payer: Self-pay | Admitting: Neurology

## 2018-01-06 ENCOUNTER — Ambulatory Visit (INDEPENDENT_AMBULATORY_CARE_PROVIDER_SITE_OTHER): Payer: BLUE CROSS/BLUE SHIELD | Admitting: Neurology

## 2018-01-06 VITALS — BP 111/81 | HR 84 | Ht 60.0 in | Wt 225.0 lb

## 2018-01-06 DIAGNOSIS — I639 Cerebral infarction, unspecified: Secondary | ICD-10-CM

## 2018-01-06 DIAGNOSIS — E538 Deficiency of other specified B group vitamins: Secondary | ICD-10-CM

## 2018-01-06 MED ORDER — ASPIRIN EC 325 MG PO TBEC: 325.0000 mg | DELAYED_RELEASE_TABLET | Freq: Every day | ORAL | 0 refills | Status: DC

## 2018-01-06 NOTE — Patient Instructions (Addendum)
Stroke Prevention Some medical conditions and behaviors are associated with a higher chance of having a stroke. You can help prevent a stroke by making nutrition, lifestyle, and other changes, including managing any medical conditions you may have. What nutrition changes can be made?  Eat healthy foods. You can do this by: ? Choosing foods high in fiber, such as fresh fruits and vegetables and whole grains. ? Eating at least 5 or more servings of fruits and vegetables a day. Try to fill half of your plate at each meal with fruits and vegetables. ? Choosing lean protein foods, such as lean cuts of meat, poultry without skin, fish, tofu, beans, and nuts. ? Eating low-fat dairy products. ? Avoiding foods that are high in salt (sodium). This can help lower blood pressure. ? Avoiding foods that have saturated fat, trans fat, and cholesterol. This can help prevent high cholesterol. ? Avoiding processed and premade foods.  Follow your health care provider's specific guidelines for losing weight, controlling high blood pressure (hypertension), lowering high cholesterol, and managing diabetes. These may include: ? Reducing your daily calorie intake. ? Limiting your daily sodium intake to 1,500 milligrams (mg). ? Using only healthy fats for cooking, such as olive oil, canola oil, or sunflower oil. ? Counting your daily carbohydrate intake. What lifestyle changes can be made?  Maintain a healthy weight. Talk to your health care provider about your ideal weight.  Get at least 30 minutes of moderate physical activity at least 5 days a week. Moderate activity includes brisk walking, biking, and swimming.  Do not use any products that contain nicotine or tobacco, such as cigarettes and e-cigarettes. If you need help quitting, ask your health care provider. It may also be helpful to avoid exposure to secondhand smoke.  Limit alcohol intake to no more than 1 drink a day for nonpregnant women and 2 drinks a  day for men. One drink equals 12 oz of beer, 5 oz of wine, or 1 oz of hard liquor.  Stop any illegal drug use.  Avoid taking birth control pills. Talk to your health care provider about the risks of taking birth control pills if: ? You are over 61 years old. ? You smoke. ? You get migraines. ? You have ever had a blood clot. What other changes can be made?  Manage your cholesterol levels. ? Eating a healthy diet is important for preventing high cholesterol. If cholesterol cannot be managed through diet alone, you may also need to take medicines. ? Take any prescribed medicines to control your cholesterol as told by your health care provider.  Manage your diabetes. ? Eating a healthy diet and exercising regularly are important parts of managing your blood sugar. If your blood sugar cannot be managed through diet and exercise, you may need to take medicines. ? Take any prescribed medicines to control your diabetes as told by your health care provider.  Control your hypertension. ? To reduce your risk of stroke, try to keep your blood pressure below 130/80. ? Eating a healthy diet and exercising regularly are an important part of controlling your blood pressure. If your blood pressure cannot be managed through diet and exercise, you may need to take medicines. ? Take any prescribed medicines to control hypertension as told by your health care provider. ? Ask your health care provider if you should monitor your blood pressure at home. ? Have your blood pressure checked every year, even if your blood pressure is normal. Blood pressure increases with  age and some medical conditions.  Get evaluated for sleep disorders (sleep apnea). Talk to your health care provider about getting a sleep evaluation if you snore a lot or have excessive sleepiness.  Take over-the-counter and prescription medicines only as told by your health care provider. Aspirin or blood thinners (antiplatelets or  anticoagulants) may be recommended to reduce your risk of forming blood clots that can lead to stroke.  Make sure that any other medical conditions you have, such as atrial fibrillation or atherosclerosis, are managed. What are the warning signs of a stroke? The warning signs of a stroke can be easily remembered as BEFAST.  B is for balance. Signs include: ? Dizziness. ? Loss of balance or coordination. ? Sudden trouble walking.  E is for eyes. Signs include: ? A sudden change in vision. ? Trouble seeing.  F is for face. Signs include: ? Sudden weakness or numbness of the face. ? The face or eyelid drooping to one side.  A is for arms. Signs include: ? Sudden weakness or numbness of the arm, usually on one side of the body.  S is for speech. Signs include: ? Trouble speaking (aphasia). ? Trouble understanding.  T is for time. ? These symptoms may represent a serious problem that is an emergency. Do not wait to see if the symptoms will go away. Get medical help right away. Call your local emergency services (911 in the [place] Do not drive yourself to the hospital.  Other signs of stroke may include: ? A sudden, severe headache with no known cause. ? Nausea or vomiting. ? Seizure.  Where to find more information: For more information, visit:  American Stroke Association: www.strokeassociation.org  National Stroke Association: www.stroke.org  Summary  You can prevent a stroke by eating healthy, exercising, not smoking, limiting alcohol intake, and managing any medical conditions you may have.  Do not use any products that contain nicotine or tobacco, such as cigarettes and e-cigarettes. If you need help quitting, ask your health care provider. It may also be helpful to avoid exposure to secondhand smoke.  Remember BEFAST for warning signs of stroke. Get help right away if you or a loved one has any of these signs. This information is not intended to replace advice given  to you by your health care provider. Make sure you discuss any questions you have with your health care provider. Document Released: 10/16/2004 Document Revised: 10/14/2016 Document Reviewed: 10/14/2016 Elsevier Interactive Patient Education  2018 [employer]     Vitamin B12 Deficiency Vitamin B12 deficiency occurs when the body does not have enough vitamin B12. Vitamin B12 is an important vitamin. The body needs vitamin B12:  To make red blood cells.  To make DNA. This is the genetic material inside cells.  To help the nerves work properly so they can carry messages from the brain to the body.  Vitamin B12 deficiency can cause various health problems, such as a low red blood cell count (anemia) or nerve damage. What are the causes? This condition may be caused by:  Not eating enough foods that contain vitamin B12.  Not having enough stomach acid and digestive fluids to properly absorb vitamin B12 from the food that you eat.  Certain digestive system diseases that make it hard to absorb vitamin B12. These diseases include Crohn disease, chronic pancreatitis, and cystic fibrosis.  Pernicious anemia. This is a condition in which the body does not make enough of a protein (intrinsic factor), resulting in too few  red blood cells.  Having a surgery in which part of the stomach or small intestine is removed.  Taking certain medicines that make it hard for the body to absorb vitamin B12. These medicines include: ? Heartburn medicine (antacids and proton pump inhibitors). ? An antibiotic medicine called neomycin. ? Some medicines that are used to treat diabetes, tuberculosis, gout, or high cholesterol.  What increases the risk? The following factors may make you more likely to develop a B12 deficiency:  Being older than age 72.  Eating a vegetarian or vegan diet, especially while you are pregnant.  Eating a poor diet while you are pregnant.  Taking certain drugs.  Having  alcoholism.  What are the signs or symptoms? In some cases, there are no symptoms of this condition. If the condition leads to anemia or nerve damage, various symptoms can occur, such as:  Weakness.  Fatigue.  Loss of appetite.  Weight loss.  Numbness or tingling in your hands and feet.  Redness and burning of the tongue.  Confusion or memory problems.  Depression.  Sensory problems, such as color blindness, ringing in the ears, or loss of taste.  Diarrhea or constipation.  Trouble walking.  If anemia is severe, symptoms can include:  Shortness of breath.  Dizziness.  Rapid heart rate (tachycardia).  How is this diagnosed? This condition may be diagnosed with a blood test to measure the level of vitamin B12 in your blood. You may have other tests to help find the cause of your vitamin B12 deficiency. These tests may include:  A complete blood count (CBC). This is a group of tests that measure certain characteristics of blood cells.  A blood test to measure intrinsic factor.  An endoscopy. In this procedure, a thin tube with a camera on the end is used to look into your stomach or intestines.  How is this treated? Treatment for this condition depends on the cause. Common treatment options include:  Changing your eating and drinking habits, such as: ? Eating more foods that contain vitamin B12. ? Drinking less alcohol or no alcohol.  Taking vitamin B12 supplements. Your health care provider will tell you which dosage is best for you.  Getting vitamin B12 injections.  Follow these instructions at home:  Take supplements only as told by your health care provider. Follow the directions carefully.  Get any injections that are prescribed by your health care provider.  Do not miss your appointments.  Eat lots of healthy foods that contain vitamin B12. Ask your health care provider if you should work with a dietitian. Foods that contain vitamin B12  include: ? Meat. ? Meat from birds (poultry). ? Fish. ? Eggs. ? Cereal and dairy products that are fortified. This means that vitamin B12 has been added to the food. Check the label on the package to see if the food is fortified.  Do not abuse alcohol.  Keep all follow-up visits as told by your health care provider. This is important. Contact a health care provider if:  Your symptoms come back. Get help right away if:  You develop shortness of breath.  You have chest pain.  You become dizzy or you lose consciousness. This information is not intended to replace advice given to you by your health care provider. Make sure you discuss any questions you have with your health care provider. Document Released: 12/01/2011 Document Revised: 02/20/2016 Document Reviewed: 01/24/2015 Elsevier Interactive Patient Education  2018 [employer].

## 2018-01-06 NOTE — Progress Notes (Signed)
[facility]    Provider:  Dr Lucia Gaskins Referring Provider: [facility],* Primary Care Physician:  Lyndon Code, MD  CC:  stroke  HPI:  Natasha Moreno is a 37 y.o. female here as a referral from [facility] for stroke. PMHx smoking, morbid obesity. She is still having difficulty speaking, left hand comes and goes as far as sensation and fine motor. She was doing her daily routine and she went to the gym and went home, showered, she sat down and it was acute went numb, she couldn't control the arm. She was smoking, 3-4 cigarettes a day, no birth control. She is trying to lose weight, going to the gym. No drug use. She was drinking protein shakes one a day she isn't doing that anymore. Her left side including the face went numb. She feels her speech is frustrating but declines physical therapy. Sleep study did not show sleep apnea. She also saw hematology oncology. She was getting shots. 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 No other focal neurologic deficits, associated symptoms, inciting events or modifiable factors.  Reviewed notes, labs and imaging from outside physicians, which showed:  Reviewed inpatient notes, she presented with left-sided numbness, MRI showed an acute stroke. TEE showed no PFO or thrombus. Started on ASA and statin. She was also referred to cardiology for prolonged cardiac monitoring. She is a smoker and smoking cessation was recommended.   Personally reviewed MRI brain images and agree with the following:  Extensive lab testing was performed including prothrombin gene, beta-2 glycoprotein, ANA, methylmalonic acid, CBC, B12, protein C total, protein S total, lupus anticoagulant, factor V Leiden, Antithrombin III, homocystine, cardiolipin antibodies, sed rate, vitamin D, vitamin B12, TSH, urine drug screen, lipid panel, hemoglobin A1c, HIV which were all unremarkable.    ldl 49  IMPRESSION: Subcentimeter acute  infarction at the base of the brain on the right consistent with location in the corticospinal tract, possibly with minor involvement of the lateral thalamus. No evidence of swelling or hemorrhage. Otherwise normal study.  IMPRESSION: 1. Negative CTA head and neck. No large vessel occlusion. No atherosclerosis or stenosis identified. 2. Stable and negative CT appearance of the brain  Review of Systems: Patient complains of symptoms per HPI as well as the following symptoms: numbness. Pertinent negatives and positives per HPI. All others negative.   Social History   Socioeconomic History  . Marital status: Single    Spouse name: Not on file  . Number of children: 2  . Years of education: Not on file  . Highest education level: High school graduate  Occupational History  . Not on file  Social Needs  . Financial resource strain: Not hard at all  . Food insecurity:    Worry: Never true    Inability: Never true  . Transportation needs:    Medical: No    Non-medical: No  Tobacco Use  . Smoking status: Former Smoker    Packs/day: 0.25    Types: Cigarettes    Last attempt to quit: 11/09/2017    Years since quitting: 0.1  . Smokeless tobacco: Never Used  . Tobacco comment: approximately 4 cigarettes a day  Substance and Sexual Activity  . Alcohol use: Yes    Frequency: Never    Comment: every other weekend, not much   . Drug use: No  . Sexual activity: Yes    Birth control/protection: Condom  Lifestyle  . Physical activity:    Days per  week: 3 days    Minutes per session: 50 min  . Stress: Not at all  Relationships  . Social connections:    Talks on phone: More than three times a week    Gets together: Once a week    Attends religious service: Never    Active member of club or organization: No    Attends meetings of clubs or organizations: Never    Relationship status: Not on file  . Intimate partner violence:    Fear of current or ex partner: Not on file     Emotionally abused: Not on file    Physically abused: Not on file    Forced sexual activity: Not on file  Other Topics Concern  . Not on file  Social History Narrative   Lives at home with her parents, brother and her children   Right handed   Drinks rare caffeine     Family History  Problem Relation Age of Onset  . Kidney failure Father   . Heart attack Father        open heart surgery  . Hypertension Father   . Diabetes Father   . Gout Father   . Cataracts Father     Past Medical History:  Diagnosis Date  . Hemoglobin E-A disorder (HCC)   . Hyperlipidemia   . Morbid obesity (HCC)   . Stroke [facility])     Past Surgical History:  Procedure Laterality Date  . CHOLECYSTECTOMY    . TEE WITHOUT CARDIOVERSION N/A 11/11/2017   Procedure: TRANSESOPHAGEAL ECHOCARDIOGRAM (TEE);  Surgeon: Dalia Heading, MD;  Location: [facility] ORS;  Service: Cardiovascular;  Laterality: N/A;  . TUBAL LIGATION    . WISDOM TOOTH EXTRACTION      Current Outpatient Medications  Medication Sig Dispense Refill  . atorvastatin (LIPITOR) 10 MG tablet Take 1 tablet (10 mg total) by mouth daily. (Patient taking differently: Take 40 mg by mouth daily. ) 90 tablet 3  . cetirizine (ZYRTEC ALLERGY) 10 MG tablet Take by mouth.    Marland Kitchen aspirin EC 325 MG tablet Take 1 tablet (325 mg total) by mouth daily. 30 tablet 0   Current Facility-Administered Medications  Medication Dose Route Frequency Provider Last Rate Last Dose  . cyanocobalamin ((VITAMIN B-12)) injection 1,000 mcg  1,000 mcg Intramuscular Once Lyndon Code, MD        Allergies as of 01/06/2018  . (No Known Allergies)    Vitals: BP 111/81 (BP Location: Left Arm, Patient Position: Sitting)   Pulse 84   Ht 5' (1.524 m)   Wt 225 lb (102.1 kg)   BMI 43.94 kg/m  Last Weight:  Wt Readings from Last 1 Encounters:  01/06/18 225 lb (102.1 kg)   Last Height:   Ht Readings from Last 1 Encounters:  01/06/18 5' (1.524 m)   Physical exam: Exam: Gen:  NAD, conversant, well nourised, obese, well groomed                     CV: RRR, no MRG. No Carotid Bruits. No peripheral edema, warm, nontender Eyes: Conjunctivae clear without exudates or hemorrhage  Neuro: Detailed Neurologic Exam  Speech:    Speech is normal; fluent and spontaneous with normal comprehension.  Cognition:    The patient is oriented to person, place, and time;     recent and remote memory intact;     language fluent;     normal attention, concentration,     fund of knowledge Cranial  Nerves:    The pupils are equal, round, and reactive to light. The fundi are normal and spontaneous venous pulsations are present. Visual fields are full to finger confrontation. Extraocular movements are intact. Trigeminal sensation is impaired on left, the muscles of mastication are normal. The face is symmetric. The palate elevates in the midline. Hearing intact. Voice is normal. Shoulder shrug is normal. The tongue has normal motion without fasciculations.   Coordination:    Normal finger to nose and heel to shin. Normal rapid alternating movements.   Gait:    Heel-toe and tandem gait are normal.   Motor Observation:    No asymmetry, no atrophy, and no involuntary movements noted. Tone:    Normal muscle tone.    Posture:    Posture is normal. normal erect    Strength:    Strength is V/V in the upper and lower limbs.      Sensation: left minimal hemisensory loss     Reflex Exam:  DTR's:    Deep tendon reflexes in the upper and lower extremities are normal bilaterally.   Toes:    The toes are downgoing bilaterally.   Clonus:    Clonus is absent.       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 Hypercoagulable panel was negative as was TEE for pfo or thrombus. She was a smoker.She is morbidly obese as well.   - She is on ASA 325mg  and cholesterol medication -  [facility] - She saw cardiology and hematology oncology - 30-day heart monitor (had a 48 hour needs 30-day) - repeat B12 deficiency needs oral supplementation   I had a long d/w patient about her recent stroke, risk for recurrent stroke/TIAs, personally independently reviewed imaging studies and stroke evaluation results and answered questions.Continue ASA 325mg  for secondary stroke prevention and maintain strict control of hypertension with blood pressure goal below 130/90, diabetes with hemoglobin A1c goal below 6.5% and lipids with LDL cholesterol goal below 70 mg/dL I also advised the patient to eat a healthy diet with plenty of whole grains, cereals, fruits and vegetables, exercise regularly and maintain ideal body weight .  Orders Placed This Encounter  Procedures  . B12 and Folate Panel  . Methylmalonic acid, serum  . [facility]  . CARDIAC EVENT MONITOR      Naomie DeanAntonia Devyn Griffing, MD  [facility] [address] [place], [place] [id number]  Phone [phone] Fax [phone]

## 2018-01-10 LAB — METHYLMALONIC ACID, SERUM: Methylmalonic Acid: 76 nmol/L (ref 0–378)

## 2018-01-10 LAB — B12 AND FOLATE PANEL
Folate: 3.7 ng/mL (ref >3.0)
Vitamin B-12: 421 pg/mL (ref 232–1245)

## 2018-01-11 ENCOUNTER — Telehealth: Payer: Self-pay | Admitting: Neurology

## 2018-01-11 NOTE — Telephone Encounter (Signed)
Called the patient. No answer. LVM per DPR with information for the pt regarding her results and Dr Trevor MaceAhern's recommendation. Informed the pt to call back if she has questions or concerns.

## 2018-01-11 NOTE — Telephone Encounter (Signed)
-----   Message from Anson FretAntonia B Ahern, MD sent at 01/10/2018  7:13 PM EDT ----- B12 is normal at this time but it was very low 2 months ago she should continue to watch it very closely. I recommend she continue to take B12 supplementation thanks

## 2018-01-12 ENCOUNTER — Ambulatory Visit (INDEPENDENT_AMBULATORY_CARE_PROVIDER_SITE_OTHER): Payer: BLUE CROSS/BLUE SHIELD | Admitting: Internal Medicine

## 2018-01-12 ENCOUNTER — Encounter: Payer: Self-pay | Admitting: Internal Medicine

## 2018-01-12 VITALS — BP 124/84 | HR 84 | Resp 16 | Ht 60.0 in | Wt 223.4 lb

## 2018-01-12 DIAGNOSIS — I639 Cerebral infarction, unspecified: Secondary | ICD-10-CM

## 2018-01-12 DIAGNOSIS — D519 Vitamin B12 deficiency anemia, unspecified: Secondary | ICD-10-CM

## 2018-01-12 NOTE — Progress Notes (Signed)
[facility] [address] [place], [place] [address]  Internal MEDICINE  Office Visit Note  Patient Name: Natasha BoschDorothy Moreno  [id number] 20, 1982  [id number]  Date of Service: 01/12/2018  Chief Complaint  Patient presents with  . lab results    6 week follow up    HPI  Pt is here for routine follow up. Pt was hospitalized in Feb 2019 for acute embolic stroke. Risk factors is obesity and smoking. Pt has seen Neurology, Hematology and Pulmonary, work up included TEE which was negative. Pt feels well and is ready to go to work. She has stopped smoking and is working on her weight. There might be a question of desaturations on her BSS however no OSA.   Current Medication: Outpatient Encounter Medications as of 01/12/2018  Medication Sig Note  . aspirin EC 325 MG tablet Take 1 tablet (325 mg total) by mouth daily.   Marland Kitchen. atorvastatin (LIPITOR) 10 MG tablet Take 1 tablet (10 mg total) by mouth daily. (Patient taking differently: Take 40 mg by mouth daily. ) 01/06/2018: When patient finishes current bottle, she will start 10 mg dose daily  . cetirizine (ZYRTEC ALLERGY) 10 MG tablet Take by mouth.    Facility-Administered Encounter Medications as of 01/12/2018  Medication  . cyanocobalamin ((VITAMIN B-12)) injection 1,000 mcg    Surgical History: Past Surgical History:  Procedure Laterality Date  . CHOLECYSTECTOMY    . TEE WITHOUT CARDIOVERSION N/A 11/11/2017   Procedure: TRANSESOPHAGEAL ECHOCARDIOGRAM (TEE);  Surgeon: Dalia HeadingFath, Kenneth A, MD;  Location: [facility] ORS;  Service: Cardiovascular;  Laterality: N/A;  . TUBAL LIGATION    . WISDOM TOOTH EXTRACTION      Medical History: Past Medical History:  Diagnosis Date  . Hemoglobin E-A disorder (HCC)   . Hyperlipidemia   . Morbid obesity (HCC)   . Stroke [facility](HCC)     Family History: Family History  Problem Relation Age of Onset  . Kidney failure Father   . Heart attack Father        open heart surgery  . Hypertension Father   .  Diabetes Father   . Gout Father   . Cataracts Father     Social History   Socioeconomic History  . Marital status: Single    Spouse name: Not on file  . Number of children: 2  . Years of education: Not on file  . Highest education level: High school graduate  Occupational History  . Not on file  Social Needs  . Financial resource strain: Not hard at all  . Food insecurity:    Worry: Never true    Inability: Never true  . Transportation needs:    Medical: No    Non-medical: No  Tobacco Use  . Smoking status: Former Smoker    Packs/day: 0.25    Types: Cigarettes    Last attempt to quit: 11/09/2017    Years since quitting: 0.1  . Smokeless tobacco: Never Used  . Tobacco comment: approximately 4 cigarettes a day  Substance and Sexual Activity  . Alcohol use: Yes    Frequency: Never    Comment: every other weekend, not much   . Drug use: No  . Sexual activity: Yes    Birth control/protection: Condom  Lifestyle  . Physical activity:    Days per week: 3 days    Minutes per session: 50 min  . Stress: Not at all  Relationships  . Social connections:    Talks on phone: More than three times a  week    Gets together: Once a week    Attends religious service: Never    Active member of club or organization: No    Attends meetings of clubs or organizations: Never    Relationship status: Not on file  . Intimate partner violence:    Fear of current or ex partner: Not on file    Emotionally abused: Not on file    Physically abused: Not on file    Forced sexual activity: Not on file  Other Topics Concern  . Not on file  Social History Narrative   Lives at home with her parents, brother and her children   Right handed   Drinks rare caffeine    Review of Systems  Constitutional: Negative for chills, diaphoresis and fatigue.  HENT: Negative for ear pain, postnasal drip and sinus pressure.   Eyes: Negative for photophobia, discharge, redness, itching and visual disturbance.   Respiratory: Negative for cough, shortness of breath and wheezing.   Cardiovascular: Negative for chest pain, palpitations and leg swelling.  Gastrointestinal: Negative for abdominal pain, constipation, diarrhea, nausea and vomiting.  Genitourinary: Negative for dysuria and flank pain.  Musculoskeletal: Negative for arthralgias, back pain, gait problem and neck pain.  Skin: Negative for color change.  Allergic/Immunologic: Negative for environmental allergies and food allergies.  Neurological: Negative for dizziness and headaches.  Hematological: Does not bruise/bleed easily.  Psychiatric/Behavioral: Negative for agitation, behavioral problems (depression) and hallucinations.   Vital Signs: BP 124/84 (BP Location: Left Arm, Patient Position: Sitting)   Pulse 84   Resp 16   Ht 5' (1.524 m)   Wt 223 lb 6.4 oz (101.3 kg)   SpO2 93%   BMI 43.63 kg/m    Physical Exam  Constitutional: She is oriented to person, place, and time. She appears well-developed and well-nourished. No distress.  HENT:  Head: Normocephalic and atraumatic.  Mouth/Throat: Oropharynx is clear and moist. No oropharyngeal exudate.  Eyes: Pupils are equal, round, and reactive to light. EOM are normal.  Neck: Normal range of motion. Neck supple. No JVD present. No tracheal deviation present. No thyromegaly present.  Cardiovascular: Normal rate, regular rhythm and normal heart sounds. Exam reveals no gallop and no friction rub.  No murmur heard. Pulmonary/Chest: Effort normal. No respiratory distress. She has no wheezes. She has no rales. She exhibits no tenderness.  Abdominal: Soft. Bowel sounds are normal.  Musculoskeletal: Normal range of motion.  Lymphadenopathy:    She has no cervical adenopathy.  Neurological: She is alert and oriented to person, place, and time. No cranial nerve deficit.  Skin: Skin is warm and dry. She is not diaphoretic.  Psychiatric: She has a normal mood and affect. Her behavior is  normal. Judgment and thought content normal.   Assessment/Plan: 1. Cerebrovascular accident (CVA), unspecified mechanism (HCC) - Pt has 100% recovery. Pt is able to go to work without any limitations   2. Anemia due to vitamin B12 deficiency, unspecified B12 deficiency type - Continue B12   3. Morbid obesity (HCC) Obesity Counseling: Risk Assessment: An assessment of behavioral risk factors was made today and includes lack of exercise sedentary lifestyle, lack of portion control and poor dietary habits.  Risk Modification Advice: She was counseled on portion control guidelines. Restricting daily caloric intake to. . The detrimental long term effects of obesity on her health and ongoing poor compliance was also discussed with the patient.   General Counseling: makailyn mccormick understanding of the findings of todays visit and agrees with  plan of treatment. I have discussed any further diagnostic evaluation that may be needed or ordered today. We also reviewed her medications today. she has been encouraged to call the office with any questions or concerns that should arise related to todays visit.  Time spent:25 Minutes  Dr Lyndon Code Internal medicine

## 2018-01-13 ENCOUNTER — Telehealth: Payer: Self-pay

## 2018-01-13 NOTE — Telephone Encounter (Signed)
Patient advised that her paperwork is ready for pickup.Natasha Moreno

## 2018-01-13 NOTE — Telephone Encounter (Signed)
Faxed return to work letter to [employer] to [phone].Tat

## 2018-01-21 ENCOUNTER — Encounter: Payer: Self-pay | Admitting: Internal Medicine

## 2018-01-21 ENCOUNTER — Ambulatory Visit (INDEPENDENT_AMBULATORY_CARE_PROVIDER_SITE_OTHER): Payer: BLUE CROSS/BLUE SHIELD | Admitting: Internal Medicine

## 2018-01-21 VITALS — BP 126/86 | HR 91 | Resp 16 | Ht 60.0 in | Wt 222.0 lb

## 2018-01-21 DIAGNOSIS — J301 Allergic rhinitis due to pollen: Secondary | ICD-10-CM | POA: Diagnosis not present

## 2018-01-21 NOTE — Procedures (Signed)
    [facility] Allergy MQT Recording Form  [facility] [address] [place], [place] [address] Phone [phone] Fax [phone]   Patient Name: Natasha Moreno Age: 37 y.o. Sex: female Date of Service: 01/21/2018   Performing Provider: Yevonne Pax MD [facility]         Battery A Back   Site Ant[facility] Flare  A1 Positive Control 4 6  A2 Negative Control 3 3  A3 American Elm 0 0  A4 Maple Box Elder 0 0  A5 Grass Mix 0 0  A6 Dock Sorrel Mix 0 0  A7 Russian Thistle 0 0  A8 Ragweed Mix 0 0  A9 English Pantain 7 7  A10 Oak Mix  10 29   Battery B Wheal Flare  B1 Lambs Quarters 0 0  B2 Cottonwood 0 0  B3 Pigweed Mix 0 0  B4 Acacia 0 0  B5 Pine Mix 0 0  B6 Privet 19 25  B7 White/Red Mulberry 7 7  B8 Western Water Hemp 0 0  B9 French [place] Territ[place] Grass 0 0  B10 Melalucea 7 9   Battery C Wheal Flare  C1 Red River Birch 0 0  C2 Eastern Sycamore 0 0  C3 Bahai Grass 0 0  C4 American Beech 0 0  C5 Ash Mix 7 19  C6 Black Willow 0 0  C7 Hickory 20 35  C8 Black Walnut 14 20  C9 Red Cedar 0 0  C10 Sweet Gum  0 0   Battery D Wheal Flare  D1 Cultivated Oat 0 0  D2 Dog Fennel 0 0  D3 Common Mugwort 0 0  D4 Marsh Elder 0 0  D5 Johnson 0 0  D6 Hackberry Tree 7 9  D7 Bayberry Tree 7 9  D8 Cypress, Bald Tree 0 0  D9 Aspergillus Fumigatus 0 0  D10 Alternia  0 0   Battery E Wheal Flare  E1 Dreschlere 0 0  E2 Fusarium Mix 0 0  E3 Cladosporum Sph 0 0  E4 Bipolaris 0 0  E5 Penicillin chrys 0 0  E6 Cladosporum Herb 0 0  E7 Candida 0 0  E8 Aureobasidium 0 0  E9 Rhizopus 0 0  E10 Botrytis  0 0   Battery F Wheal Flare  F1 Aspergillus Luxembourg 0 0  F2 Dust Mite Mix 0 0  F3 Cockroach Mix 0 0  F4 Cat Hair 00 0  F5 Dog Mixed breeds 0 0  F6 Feather Mix 0 0

## 2018-01-21 NOTE — Progress Notes (Signed)
Allergy test done.

## 2018-02-11 ENCOUNTER — Other Ambulatory Visit: Payer: Self-pay | Admitting: Internal Medicine

## 2018-02-11 MED ORDER — ATORVASTATIN CALCIUM 10 MG PO TABS
10.0000 mg | ORAL_TABLET | Freq: Every day | ORAL | 3 refills | Status: DC
Start: 2018-02-11 — End: 2019-02-25

## 2018-02-11 NOTE — Telephone Encounter (Signed)
Spoke with pt that we have to repeat pap because is ab error as per pt we can repeat next year

## 2018-02-17 DIAGNOSIS — R51 Headache: Secondary | ICD-10-CM | POA: Diagnosis not present

## 2018-02-17 DIAGNOSIS — I639 Cerebral infarction, unspecified: Secondary | ICD-10-CM | POA: Diagnosis not present

## 2018-02-25 ENCOUNTER — Encounter (INDEPENDENT_AMBULATORY_CARE_PROVIDER_SITE_OTHER): Payer: BLUE CROSS/BLUE SHIELD

## 2018-03-03 ENCOUNTER — Ambulatory Visit (INDEPENDENT_AMBULATORY_CARE_PROVIDER_SITE_OTHER): Payer: BLUE CROSS/BLUE SHIELD | Admitting: Family Medicine

## 2018-04-12 ENCOUNTER — Emergency Department
Admission: EM | Admit: 2018-04-12 | Discharge: 2018-04-12 | Disposition: A | Discharge status: Home / Self Care | Payer: BLUE CROSS/BLUE SHIELD | Attending: Student in an Organized Health Care Education/Training Program | Admitting: Student in an Organized Health Care Education/Training Program

## 2018-04-12 ENCOUNTER — Other Ambulatory Visit: Payer: Self-pay

## 2018-04-12 ENCOUNTER — Encounter: Payer: Self-pay | Admitting: Intensive Care

## 2018-04-12 DIAGNOSIS — R51 Headache: Secondary | ICD-10-CM | POA: Diagnosis not present

## 2018-04-12 DIAGNOSIS — Z7982 Long term (current) use of aspirin: Secondary | ICD-10-CM | POA: Insufficient documentation

## 2018-04-12 DIAGNOSIS — R519 Headache, unspecified: Secondary | ICD-10-CM

## 2018-04-12 DIAGNOSIS — Z8673 Personal history of transient ischemic attack (TIA), and cerebral infarction without residual deficits: Secondary | ICD-10-CM | POA: Insufficient documentation

## 2018-04-12 DIAGNOSIS — Z79899 Other long term (current) drug therapy: Secondary | ICD-10-CM | POA: Insufficient documentation

## 2018-04-12 DIAGNOSIS — G43909 Migraine, unspecified, not intractable, without status migrainosus: Secondary | ICD-10-CM | POA: Diagnosis not present

## 2018-04-12 HISTORY — DX: Migraine, unspecified, not intractable, without status migrainosus: G43.909

## 2018-04-12 LAB — URINALYSIS, COMPLETE (UACMP) WITH MICROSCOPIC
BACTERIA UA: NONE SEEN
BILIRUBIN URINE: NEGATIVE
GLUCOSE, UA: NEGATIVE mg/dL
Hgb urine dipstick: NEGATIVE
KETONES UR: NEGATIVE mg/dL
NITRITE: NEGATIVE
PH: 7 (ref 5.0–8.0)
Protein, ur: NEGATIVE mg/dL
SPECIFIC GRAVITY, URINE: 1.021 (ref 1.005–1.030)

## 2018-04-12 LAB — COMPREHENSIVE METABOLIC PANEL
ALK PHOS: 49 U/L (ref 38–126)
ALT: 22 U/L (ref 0–44)
ANION GAP: 5 (ref 5–15)
AST: 22 U/L (ref 15–41)
Albumin: 3.6 g/dL (ref 3.5–5.0)
BILIRUBIN TOTAL: 0.8 mg/dL (ref 0.3–1.2)
BUN: 12 mg/dL (ref 6–20)
CALCIUM: 8.9 mg/dL (ref 8.9–10.3)
CO2: 27 mmol/L (ref 22–32)
Chloride: 106 mmol/L (ref 98–111)
Creatinine, Ser: 0.49 mg/dL (ref 0.44–1.00)
GFR calc non Af Amer: >60 mL/min (ref >60)
Glucose, Bld: 130 mg/dL — ABNORMAL HIGH (ref 70–99)
POTASSIUM: 3.7 mmol/L (ref 3.5–5.1)
SODIUM: 138 mmol/L (ref 135–145)
TOTAL PROTEIN: 6.7 g/dL (ref 6.5–8.1)

## 2018-04-12 LAB — CBC WITH DIFFERENTIAL/PLATELET
BASOS PCT: 1 %
Basophils Absolute: 0.1 10*3/uL (ref 0–0.1)
EOS ABS: 0.3 10*3/uL (ref 0–0.7)
Eosinophils Relative: 3 %
HCT: 30.7 % — ABNORMAL LOW (ref 35.0–47.0)
HEMOGLOBIN: 10.1 g/dL — AB (ref 12.0–16.0)
LYMPHS ABS: 2.2 10*3/uL (ref 1.0–3.6)
Lymphocytes Relative: 26 %
MCH: 17 pg — ABNORMAL LOW (ref 26.0–34.0)
MCHC: 33 g/dL (ref 32.0–36.0)
MCV: 51.5 fL — ABNORMAL LOW (ref 80.0–100.0)
MONO ABS: 0.5 10*3/uL (ref 0.2–0.9)
MONOS PCT: 6 %
NEUTROS PCT: 64 %
Neutro Abs: 5.2 10*3/uL (ref 1.4–6.5)
Platelets: 342 10*3/uL (ref 150–440)
RBC: 5.96 MIL/uL — ABNORMAL HIGH (ref 3.80–5.20)
RDW: 18.5 % — ABNORMAL HIGH (ref 11.5–14.5)
WBC: 8.3 10*3/uL (ref 3.6–11.0)

## 2018-04-12 LAB — POCT PREGNANCY, URINE: Preg Test, Ur: NEGATIVE

## 2018-04-12 MED ORDER — DIPHENHYDRAMINE HCL 50 MG/ML IJ SOLN
12.5000 mg | Freq: Once | INTRAMUSCULAR | Status: AC
  Administered 2018-04-12: 12.5 mg via INTRAVENOUS
  Filled 2018-04-12: qty 1

## 2018-04-12 MED ORDER — SODIUM CHLORIDE 0.9 % IV BOLUS
1000.0000 mL | Freq: Once | INTRAVENOUS | Status: AC
  Administered 2018-04-12: 1000 mL via INTRAVENOUS

## 2018-04-12 MED ORDER — PROCHLORPERAZINE EDISYLATE 10 MG/2ML IJ SOLN
10.0000 mg | Freq: Once | INTRAMUSCULAR | Status: AC
  Administered 2018-04-12: 10 mg via INTRAVENOUS
  Filled 2018-04-12: qty 2

## 2018-04-12 MED ORDER — DEXAMETHASONE SODIUM PHOSPHATE 10 MG/ML IJ SOLN
10.0000 mg | Freq: Once | INTRAMUSCULAR | Status: AC
  Administered 2018-04-12: 10 mg via INTRAVENOUS
  Filled 2018-04-12: qty 1

## 2018-04-12 MED ORDER — ACETAMINOPHEN 500 MG PO TABS
1000.0000 mg | ORAL_TABLET | Freq: Once | ORAL | Status: AC
  Administered 2018-04-12: 1000 mg via ORAL
  Filled 2018-04-12: qty 2

## 2018-04-12 NOTE — ED Provider Notes (Signed)
[facility] Emergency Department Provider Note    First MD Initiated Contact with Patient 04/12/18 1728     (approximate)  I have reviewed the triage vital signs and the nursing notes.   HISTORY  Chief Complaint Migraine    HPI Natasha Moreno is a 37 y.o. female history of migraine headaches and previous history of stroke presents the ER with chief complaint of headache and blurry vision.  It was not sudden onset headache.  States she has had auras previous lady with other headaches.  Denies other associated numbness or tingling.  No weakness.  Is not the worst headache of her life.  No neck stiffness.  No nausea or vomiting.    Past Medical History:  Diagnosis Date  . Hemoglobin E-A disorder (HCC)   . Hyperlipidemia   . Migraine   . Morbid obesity (HCC)   . Stroke [facility])    Family History  Problem Relation Age of Onset  . Kidney failure Father   . Heart attack Father        open heart surgery  . Hypertension Father   . Diabetes Father   . Gout Father   . Cataracts Father    Past Surgical History:  Procedure Laterality Date  . CHOLECYSTECTOMY    . TEE WITHOUT CARDIOVERSION N/A 11/11/2017   Procedure: TRANSESOPHAGEAL ECHOCARDIOGRAM (TEE);  Surgeon: Dalia Heading, MD;  Location: [facility] ORS;  Service: Cardiovascular;  Laterality: N/A;  . TUBAL LIGATION    . WISDOM TOOTH EXTRACTION     Patient Active Problem List   Diagnosis Date Noted  . Cerebrovascular accident (CVA) (HCC) 12/02/2017  . Headache disorder 12/02/2017  . Left sided numbness 11/09/2017  . 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 Authorizing Provider  aspirin EC 325 MG tablet Take 1 tablet (325 mg total) by mouth daily. 01/06/18   Anson Fret, MD  atorvastatin (LIPITOR) 10 MG tablet Take 1 tablet (10 mg total) by mouth daily. 02/11/18   Lyndon Code, MD  cetirizine (ZYRTEC ALLERGY)  10 MG tablet Take by mouth.    [provider]    Allergies Patient has no known allergies.    Social History Social History   Tobacco Use  . Smoking status: Former Smoker    Packs/day: 0.25    Types: Cigarettes    Last attempt to quit: 11/09/2017    Years since quitting: 0.4  . Smokeless tobacco: Never Used  . Tobacco comment: approximately 4 cigarettes a day  Substance Use Topics  . Alcohol use: Yes    Frequency: Never    Comment: every other weekend, not much   . Drug use: No    Review of Systems Patient denies headaches, rhinorrhea, blurry vision, numbness, shortness of breath, chest pain, edema, cough, abdominal pain, nausea, vomiting, diarrhea, dysuria, fevers, rashes or hallucinations unless otherwise stated above in HPI. ____________________________________________   PHYSICAL EXAM:  VITAL SIGNS: Vitals:   04/12/18 1543  BP: (!) 134/99  Pulse: 87  Resp: 16  Temp: 98.2 F (36.8 C)  SpO2: 99%    Constitutional: Alert and oriented.  Eyes: Conjunctivae are normal.  Head: Atraumatic. Nose: No congestion/rhinnorhea. Mouth/Throat: Mucous membranes are moist.   Neck: No stridor. Painless ROM.  Cardiovascular: Normal rate, regular rhythm. Grossly normal heart sounds.  Good peripheral circulation. Respiratory: Normal respiratory effort.  No retractions. Lungs CTAB. Gastrointestinal: Soft and nontender.  No distention. No abdominal bruits. No CVA tenderness. Genitourinary:  Musculoskeletal: No lower extremity tenderness nor edema.  No joint effusions. Neurologic:  CN- intact.  No facial droop, Normal FNF.  Normal heel to shin.  Sensation intact bilaterally. Normal speech and language. No gross focal neurologic deficits are appreciated. No gait instability. Skin:  Skin is warm, dry and intact. No rash noted. Psychiatric: Mood and affect are normal. Speech and behavior are normal.  ____________________________________________   LABS (all labs ordered are  listed, but only abnormal results are displayed)  Results for orders placed or performed during the hospital encounter of 04/12/18 (from the past 24 hour(s))  CBC with Differential     Status: Abnormal   Collection Time: 04/12/18  3:51 PM  Result Value Ref Range   WBC 8.3 3.6 - 11.0 K/uL   RBC 5.96 (H) 3.80 - 5.20 MIL/uL   Hemoglobin 10.1 (L) 12.0 - 16.0 g/dL   HCT 16.130.7 (L) 09.635.0 - 04.547.0 %   MCV 51.5 (L) 80.0 - 100.0 fL   MCH 17.0 (L) 26.0 - 34.0 pg   MCHC 33.0 32.0 - 36.0 g/dL   RDW 40.918.5 (H) 81.111.5 - 91.414.5 %   Platelets 342 150 - 440 K/uL   Neutrophils Relative % 64 %   Neutro Abs 5.2 1.4 - 6.5 K/uL   Lymphocytes Relative 26 %   Lymphs Abs 2.2 1.0 - 3.6 K/uL   Monocytes Relative 6 %   Monocytes Absolute 0.5 0.2 - 0.9 K/uL   Eosinophils Relative 3 %   Eosinophils Absolute 0.3 0 - 0.7 K/uL   Basophils Relative 1 %   Basophils Absolute 0.1 0 - 0.1 K/uL  Comprehensive metabolic panel     Status: Abnormal   Collection Time: 04/12/18  3:51 PM  Result Value Ref Range   Sodium 138 135 - 145 mmol/L   Potassium 3.7 3.5 - 5.1 mmol/L   Chloride 106 98 - 111 mmol/L   CO2 27 22 - 32 mmol/L   Glucose, Bld 130 (H) 70 - 99 mg/dL   BUN 12 6 - 20 mg/dL   Creatinine, Ser 7.820.49 0.44 - 1.00 mg/dL   Calcium 8.9 8.9 - 95.610.3 mg/dL   Total Protein 6.7 6.5 - 8.1 g/dL   Albumin 3.6 3.5 - 5.0 g/dL   AST 22 15 - 41 U/L   ALT 22 0 - 44 U/L   Alkaline Phosphatase 49 38 - 126 U/L   Total Bilirubin 0.8 0.3 - 1.2 mg/dL   GFR calc non Af Amer >60 >60 mL/min   GFR calc Af Amer >60 >60 mL/min   Anion gap 5 5 - 15  Urinalysis, Complete w Microscopic     Status: Abnormal   Collection Time: 04/12/18  3:51 PM  Result Value Ref Range   Color, Urine YELLOW (A) YELLOW   APPearance CLEAR (A) CLEAR   Specific Gravity, Urine 1.021 1.005 - 1.030   pH 7.0 5.0 - 8.0   Glucose, UA NEGATIVE NEGATIVE mg/dL   Hgb urine dipstick NEGATIVE NEGATIVE   Bilirubin Urine NEGATIVE NEGATIVE   Ketones, ur NEGATIVE NEGATIVE mg/dL    Protein, ur NEGATIVE NEGATIVE mg/dL   Nitrite NEGATIVE NEGATIVE   Leukocytes, UA TRACE (A) NEGATIVE   RBC / HPF 0-5 0 - 5 RBC/hpf   WBC, UA 0-5 0 - 5 WBC/hpf   Bacteria, UA NONE SEEN NONE SEEN   Squamous Epithelial / LPF 0-5 0 - 5  Pregnancy, urine POC  Status: None   Collection Time: 04/12/18  4:35 PM  Result Value Ref Range   Preg Test, Ur NEGATIVE NEGATIVE   ____________________________________________ ____________________________________________  RADIOLOGY   ____________________________________________   PROCEDURES  Procedure(s) performed:  Procedures    Critical Care performed: no ____________________________________________   INITIAL IMPRESSION / ASSESSMENT AND PLAN / ED COURSE  Pertinent labs & imaging results that were available during my care of the patient were reviewed by me and considered in my medical decision making (see chart for details).   DDX: migraine, tension, cluster, iph, contusion, gca  Natasha Moreno is a 37 y.o. who presents to the ED with with Hx of migraines as well as CVA p/w HA for last day. Not worst HA ever. Gradual onset. HA similar to previous episodes. Does endorse blurry vision but no visual field cuts.  Denies trauma. No fevers or neck pain.  Afebrile in ED. VSS. Exam as above. No meningeal signs. No CN, motor, sensory or cerebellar deficits. Temporal arteries palpable and non-tender. Appears well and non-toxic.  Will provide IV fluids for hydration and IV medications for symptom control.  Likely migraine HA. Clinical picture is not consistent with ICH, SAH, SDH, EDH, TIA, or CVA. No concern for meningitis or encephalitis. No concern for GCA/Temporal arteritis.   ----------------------------------------- 7:54 PM on 04/12/2018 -----------------------------------------  Patient was able to tolerate PO and was able to ambulate with a steady gait.  Pain improved. Repeat neuro exam is again without focal deficit, nuchal rigidity or  evidence of meningeal irritation.  Stable to D/C home, follow up with PCP or Neurology if persistent recurrent Has.  Have discussed with the patient and available family all diagnostics and treatments performed thus far and all questions were answered to the best of my ability. The patient demonstrates understanding and agreement with plan.        As part of my medical decision making, I reviewed the following data within the electronic MEDICAL RECORD NUMBER Nursing notes reviewed and incorporated, Labs reviewed, notes from prior ED visits .  ____________________________________________   FINAL CLINICAL IMPRESSION(S) / ED DIAGNOSES  Final diagnoses:  Bad headache      NEW MEDICATIONS STARTED DURING THIS VISIT:  New Prescriptions   No medications on file     Note:  This document was prepared using Dragon voice recognition software and may include unintentional dictation errors.    Willy Eddy, MD 04/12/18 Corky [facility]s

## 2018-04-12 NOTE — ED Triage Notes (Signed)
Patient c/o left sided migraine and blurry vision in Left eye since 0800. HX migraines. A&O x4. Speech clear. Grips strong and equal

## 2018-04-12 NOTE — ED Notes (Signed)
Pt to be d/c after po challenge. Meal tray given

## 2018-04-12 NOTE — Discharge Instructions (Addendum)

## 2018-04-12 NOTE — ED Notes (Signed)
Pt able to eat w/o any distress. Pt states she feels "great."

## 2018-04-12 NOTE — ED Notes (Signed)
No CT scan or EKG needed at this time [facility] symptoms

## 2018-04-12 NOTE — ED Notes (Signed)
Pt states that she has had a headache in the past but today it was piercing pain in the forehead.

## 2018-04-20 DIAGNOSIS — R51 Headache: Secondary | ICD-10-CM | POA: Diagnosis not present

## 2018-04-20 DIAGNOSIS — I639 Cerebral infarction, unspecified: Secondary | ICD-10-CM | POA: Diagnosis not present

## 2018-07-14 ENCOUNTER — Ambulatory Visit (INDEPENDENT_AMBULATORY_CARE_PROVIDER_SITE_OTHER): Payer: BLUE CROSS/BLUE SHIELD | Admitting: Adult Health

## 2018-07-14 ENCOUNTER — Encounter: Payer: Self-pay | Admitting: Adult Health

## 2018-07-14 VITALS — BP 114/76 | HR 86 | Resp 16 | Ht 60.0 in | Wt 216.0 lb

## 2018-07-14 DIAGNOSIS — I639 Cerebral infarction, unspecified: Secondary | ICD-10-CM | POA: Diagnosis not present

## 2018-07-14 DIAGNOSIS — D519 Vitamin B12 deficiency anemia, unspecified: Secondary | ICD-10-CM | POA: Diagnosis not present

## 2018-07-14 DIAGNOSIS — R5383 Other fatigue: Secondary | ICD-10-CM

## 2018-07-14 DIAGNOSIS — E785 Hyperlipidemia, unspecified: Secondary | ICD-10-CM

## 2018-07-14 MED ORDER — CYANOCOBALAMIN 1000 MCG/ML IJ SOLN
1000.0000 ug | Freq: Once | INTRAMUSCULAR | Status: AC
  Administered 2018-07-14: 1000 ug via INTRAMUSCULAR

## 2018-07-14 NOTE — Progress Notes (Signed)
[facility] [address] [place], [place] [address]  Internal MEDICINE  Office Visit Note  Patient Name: Natasha Moreno  [id number]  [id number]  Date of Service: 07/14/2018  Chief Complaint  Patient presents with  . Hyperlipidemia  . Quality Metric Gaps    see's obgyn for pap   . Obesity    patient having a difficult time with weight loss ,also would like to discuss b12 injections    HPI Pt patient here today for follow-up.  She has a history of hyperlipidemia and allergies.  She also reports she was seen for a CVA in February of this year.  She denies any residual effects from the stroke.  She reports she has been doing well and has no complaints involving her stroke at this time.  She reports she has an OB/GYN that she sees for her Pap smears.  She is also concerned because she is having difficulty losing weight.  She reports she is having difficulty getting up and getting motivated.  She would like to discuss possibility of weight loss help.  She also reports having 12 injections in the past, that helped her with her fatigue.  Her most recent B12 level is just above 400 which is the low side of normal.  We will give B12 injection at today's visit, and schedule her for weight loss and follow-up in 1 month for metabolic testing and to discuss phentermine or other medication.   Current Medication: Outpatient Encounter Medications as of 07/14/2018  Medication Sig  . aspirin EC 325 MG tablet Take 1 tablet (325 mg total) by mouth daily.  Marland Kitchen atorvastatin (LIPITOR) 10 MG tablet Take 1 tablet (10 mg total) by mouth daily.  . cetirizine (ZYRTEC ALLERGY) 10 MG tablet Take 10 mg by mouth daily as needed for allergies.    Facility-Administered Encounter Medications as of 07/14/2018  Medication  . cyanocobalamin ((VITAMIN B-12)) injection 1,000 mcg  . [COMPLETED] cyanocobalamin ((VITAMIN B-12)) injection 1,000 mcg    Surgical History: Past Surgical History:  Procedure  Laterality Date  . CHOLECYSTECTOMY    . TEE WITHOUT CARDIOVERSION N/A 11/11/2017   Procedure: TRANSESOPHAGEAL ECHOCARDIOGRAM (TEE);  Surgeon: Dalia Heading, MD;  Location: [facility] ORS;  Service: Cardiovascular;  Laterality: N/A;  . TUBAL LIGATION    . WISDOM TOOTH EXTRACTION      Medical History: Past Medical History:  Diagnosis Date  . Hemoglobin E-A disorder (HCC)   . Hyperlipidemia   . Migraine   . Morbid obesity (HCC)   . Stroke [facility])     Family History: Family History  Problem Relation Age of Onset  . Kidney failure Father   . Heart attack Father        open heart surgery  . Hypertension Father   . Diabetes Father   . Gout Father   . Cataracts Father     Social History   Socioeconomic History  . Marital status: Single    Spouse name: Not on file  . Number of children: 2  . Years of education: Not on file  . Highest education level: High school graduate  Occupational History  . Not on file  Social Needs  . Financial resource strain: Not hard at all  . Food insecurity:    Worry: Never true    Inability: Never true  . Transportation needs:    Medical: No    Non-medical: No  Tobacco Use  . Smoking status: Former Smoker    Packs/day: 0.25  Types: Cigarettes    Last attempt to quit: 11/09/2017    Years since quitting: 0.6  . Smokeless tobacco: Never Used  . Tobacco comment: approximately 4 cigarettes a day  Substance and Sexual Activity  . Alcohol use: Yes    Frequency: Never    Comment: every other weekend, not much   . Drug use: No  . Sexual activity: Yes    Birth control/protection: Condom  Lifestyle  . Physical activity:    Days per week: 3 days    Minutes per session: 50 min  . Stress: Not at all  Relationships  . Social connections:    Talks on phone: More than three times a week    Gets together: Once a week    Attends religious service: Never    Active member of club or organization: No    Attends meetings of clubs or organizations:  Never    Relationship status: Not on file  . Intimate partner violence:    Fear of current or ex partner: Not on file    Emotionally abused: Not on file    Physically abused: Not on file    Forced sexual activity: Not on file  Other Topics Concern  . Not on file  Social History Narrative   Lives at home with her parents, brother and her children   Right handed   Drinks rare caffeine       Review of Systems  Constitutional: Negative for chills, fatigue and unexpected weight change.  HENT: Negative for congestion, rhinorrhea, sneezing and sore throat.   Eyes: Negative for photophobia, pain and redness.  Respiratory: Negative for cough, chest tightness and shortness of breath.   Cardiovascular: Negative for chest pain and palpitations.  Gastrointestinal: Negative for abdominal pain, constipation, diarrhea, nausea and vomiting.  Endocrine: Negative.   Genitourinary: Negative for dysuria and frequency.  Musculoskeletal: Negative for arthralgias, back pain, joint swelling and neck pain.  Skin: Negative for rash.  Allergic/Immunologic: Negative.   Neurological: Negative for tremors and numbness.  Hematological: Negative for adenopathy. Does not bruise/bleed easily.  Psychiatric/Behavioral: Negative for behavioral problems and sleep disturbance. The patient is not nervous/anxious.     Vital Signs: BP 114/76   Pulse 86   Resp 16   Ht 5' (1.524 m)   Wt 216 lb (98 kg)   SpO2 97%   BMI 42.18 kg/m    Physical Exam  Constitutional: She is oriented to person, place, and time. She appears well-developed and well-nourished. No distress.  HENT:  Head: Normocephalic and atraumatic.  Mouth/Throat: Oropharynx is clear and moist. No oropharyngeal exudate.  Eyes: Pupils are equal, round, and reactive to light. EOM are normal.  Neck: Normal range of motion. Neck supple. No JVD present. No tracheal deviation present. No thyromegaly present.  Cardiovascular: Normal rate, regular rhythm and  normal heart sounds. Exam reveals no gallop and no friction rub.  No murmur heard. Pulmonary/Chest: Effort normal and breath sounds normal. No respiratory distress. She has no wheezes. She has no rales. She exhibits no tenderness.  Abdominal: Soft. There is no tenderness. There is no guarding.  Musculoskeletal: Normal range of motion.  Lymphadenopathy:    She has no cervical adenopathy.  Neurological: She is alert and oriented to person, place, and time. No cranial nerve deficit.  Skin: Skin is warm and dry. She is not diaphoretic.  Psychiatric: She has a normal mood and affect. Her behavior is normal. Judgment and thought content normal.  Nursing note and vitals  reviewed.  Assessment/Plan: 1. Cerebrovascular accident (CVA), unspecified mechanism (HCC) Patient had CVA in February.  She continues to follow-up with neurology for these events.  2. Hyperlipidemia, unspecified hyperlipidemia type No recent lipid panel on file.  Will have lipid panel checked at next visit in 2 weeks we discussed weight loss.  3. Morbid obesity (HCC) We will discuss weight loss at visit in 2 weeks.  She will receive metabolic test. Obesity Counseling: Risk Assessment: An assessment of behavioral risk factors was made today and includes lack of exercise sedentary lifestyle, lack of portion control and poor dietary habits.  Risk Modification Advice: She was counseled on portion control guidelines. Restricting daily caloric intake to. . The detrimental long term effects of obesity on her health and ongoing poor compliance was also discussed with the patient.  4. Fatigue, unspecified type Given B12 injection at this time we will follow-up with labs in a few months.  5. Anemia due to vitamin B12 deficiency, unspecified B12 deficiency type - cyanocobalamin ((VITAMIN B-12)) injection 1,000 mcg  General Counseling: Bevin verbalizes understanding of the findings of todays visit and agrees with plan of treatment. I  have discussed any further diagnostic evaluation that may be needed or ordered today. We also reviewed her medications today. she has been encouraged to call the office with any questions or concerns that should arise related to todays visit.    No orders of the defined types were placed in this encounter.   Meds ordered this encounter  Medications  . cyanocobalamin ((VITAMIN B-12)) injection 1,000 mcg    Time spent: 25 Minutes   This patient was seen by Blima Ledger AGNP-C in Collaboration with Dr Lyndon Code as a part of collaborative care agreement     Johnna Acosta AGNP-C Internal medicine

## 2018-07-21 DIAGNOSIS — R51 Headache: Secondary | ICD-10-CM | POA: Diagnosis not present

## 2018-07-21 DIAGNOSIS — Z8673 Personal history of transient ischemic attack (TIA), and cerebral infarction without residual deficits: Secondary | ICD-10-CM | POA: Diagnosis not present

## 2018-07-28 ENCOUNTER — Encounter: Payer: Self-pay | Admitting: Adult Health

## 2018-07-28 ENCOUNTER — Ambulatory Visit (INDEPENDENT_AMBULATORY_CARE_PROVIDER_SITE_OTHER): Payer: BLUE CROSS/BLUE SHIELD | Admitting: Adult Health

## 2018-07-28 VITALS — BP 116/88 | HR 90 | Resp 16 | Ht 60.0 in | Wt 219.0 lb

## 2018-07-28 DIAGNOSIS — Z6841 Body Mass Index (BMI) 40.0 and over, adult: Secondary | ICD-10-CM

## 2018-07-28 DIAGNOSIS — E785 Hyperlipidemia, unspecified: Secondary | ICD-10-CM

## 2018-07-28 MED ORDER — PHENTERMINE HCL 37.5 MG PO CAPS: 37.5000 mg | ORAL_CAPSULE | ORAL | 0 refills | Status: DC

## 2018-07-28 NOTE — Progress Notes (Signed)
[facility] [address] [place], [place] [address]  Internal MEDICINE  Office Visit Note  Patient Name: Natasha Moreno  [id number]  [id number]  Date of Service: 07/28/2018  Chief Complaint  Patient presents with  . Hyperlipidemia  . Medical Management of Chronic Issues    weight loss management     HPI  Patient is here for follow-up on hyperlipidemia and weight loss management consult.  Lipid panel ordered at this time for patient.  We discussed phentermine and side effects of taking phentermine.  We also discussed our office policy on weight loss management.  Patient will need metabolic testing here today in office.   Current Medication: Outpatient Encounter Medications as of 07/28/2018  Medication Sig  . aspirin EC 325 MG tablet Take 1 tablet (325 mg total) by mouth daily.  Marland Kitchen atorvastatin (LIPITOR) 10 MG tablet Take 1 tablet (10 mg total) by mouth daily.  . cetirizine (ZYRTEC ALLERGY) 10 MG tablet Take 10 mg by mouth daily as needed for allergies.   . phentermine 37.5 MG capsule Take 1 capsule (37.5 mg total) by mouth every morning.   Facility-Administered Encounter Medications as of 07/28/2018  Medication  . cyanocobalamin ((VITAMIN B-12)) injection 1,000 mcg    Surgical History: Past Surgical History:  Procedure Laterality Date  . CHOLECYSTECTOMY    . TEE WITHOUT CARDIOVERSION N/A 11/11/2017   Procedure: TRANSESOPHAGEAL ECHOCARDIOGRAM (TEE);  Surgeon: Dalia Heading, MD;  Location: [facility] ORS;  Service: Cardiovascular;  Laterality: N/A;  . TUBAL LIGATION    . WISDOM TOOTH EXTRACTION      Medical History: Past Medical History:  Diagnosis Date  . Hemoglobin E-A disorder (HCC)   . Hyperlipidemia   . Migraine   . Morbid obesity (HCC)   . Stroke [facility])     Family History: Family History  Problem Relation Age of Onset  . Kidney failure Father   . Heart attack Father        open heart surgery  . Hypertension Father   . Diabetes Father   . Gout  Father   . Cataracts Father     Social History   Socioeconomic History  . Marital status: Single    Spouse name: Not on file  . Number of children: 2  . Years of education: Not on file  . Highest education level: High school graduate  Occupational History  . Not on file  Social Needs  . Financial resource strain: Not hard at all  . Food insecurity:    Worry: Never true    Inability: Never true  . Transportation needs:    Medical: No    Non-medical: No  Tobacco Use  . Smoking status: Former Smoker    Packs/day: 0.25    Types: Cigarettes    Last attempt to quit: 11/09/2017    Years since quitting: 0.7  . Smokeless tobacco: Never Used  . Tobacco comment: approximately 4 cigarettes a day  Substance and Sexual Activity  . Alcohol use: Yes    Frequency: Never    Comment: every other weekend, not much   . Drug use: No  . Sexual activity: Yes    Birth control/protection: Condom  Lifestyle  . Physical activity:    Days per week: 3 days    Minutes per session: 50 min  . Stress: Not at all  Relationships  . Social connections:    Talks on phone: More than three times a week    Gets together: Once a week  Attends religious service: Never    Active member of club or organization: No    Attends meetings of clubs or organizations: Never    Relationship status: Not on file  . Intimate partner violence:    Fear of current or ex partner: Not on file    Emotionally abused: Not on file    Physically abused: Not on file    Forced sexual activity: Not on file  Other Topics Concern  . Not on file  Social History Narrative   Lives at home with her parents, brother and her children   Right handed   Drinks rare caffeine       Review of Systems  Constitutional: Negative for chills, fatigue and unexpected weight change.  HENT: Negative for congestion, rhinorrhea, sneezing and sore throat.   Eyes: Negative for photophobia, pain and redness.  Respiratory: Negative for cough,  chest tightness and shortness of breath.   Cardiovascular: Negative for chest pain and palpitations.  Gastrointestinal: Negative for abdominal pain, constipation, diarrhea, nausea and vomiting.  Endocrine: Negative.   Genitourinary: Negative for dysuria and frequency.  Musculoskeletal: Negative for arthralgias, back pain, joint swelling and neck pain.  Skin: Negative for rash.  Allergic/Immunologic: Negative.   Neurological: Negative for tremors and numbness.  Hematological: Negative for adenopathy. Does not bruise/bleed easily.  Psychiatric/Behavioral: Negative for behavioral problems and sleep disturbance. The patient is not nervous/anxious.     Vital Signs: BP 116/88 (BP Location: Left Arm, Patient Position: Sitting, Cuff Size: Normal)   Pulse 90   Resp 16   Ht 5' (1.524 m)   Wt 219 lb (99.3 kg)   SpO2 98%   BMI 42.77 kg/m    Physical Exam  Constitutional: She is oriented to person, place, and time. She appears well-developed and well-nourished. No distress.  HENT:  Head: Normocephalic and atraumatic.  Mouth/Throat: Oropharynx is clear and moist. No oropharyngeal exudate.  Eyes: Pupils are equal, round, and reactive to light. EOM are normal.  Neck: Normal range of motion. Neck supple. No JVD present. No tracheal deviation present. No thyromegaly present.  Cardiovascular: Normal rate, regular rhythm and normal heart sounds. Exam reveals no gallop and no friction rub.  No murmur heard. Pulmonary/Chest: Effort normal and breath sounds normal. No respiratory distress. She has no wheezes. She has no rales. She exhibits no tenderness.  Abdominal: Soft. There is no tenderness. There is no guarding.  Musculoskeletal: Normal range of motion.  Lymphadenopathy:    She has no cervical adenopathy.  Neurological: She is alert and oriented to person, place, and time. No cranial nerve deficit.  Skin: Skin is warm and dry. She is not diaphoretic.  Psychiatric: She has a normal mood and  affect. Her behavior is normal. Judgment and thought content normal.  Nursing note and vitals reviewed.  Assessment/Plan: 1. Hyperlipidemia, unspecified hyperlipidemia type Will evaluate labs as results are available.  - Lipid Panel With LDL/HDL Ratio - CBC w/Diff/Platelet  2. Class 3 severe obesity due to excess calories with serious comorbidity and body mass index (BMI) of 40.0 to 44.9 in adult [facility]) Obesity Counseling: Risk Assessment: An assessment of behavioral risk factors was made today and includes lack of exercise sedentary lifestyle, lack of portion control and poor dietary habits.  Risk Modification Advice: She was counseled on portion control guidelines. Restricting daily caloric intake to. . The detrimental long term effects of obesity on her health and ongoing poor compliance was also discussed with the patient.  Refilled Controlled medications today.  Reviewed risks and possible side effects associated with taking Stimulants. Combination of these drugs with other psychotropic medications could cause dizziness and drowsiness. Pt needs to Monitor symptoms and exercise caution in driving and operating heavy machinery to avoid damages to oneself, to others and to the surroundings. Patient verbalized understanding in this matter. Dependence and abuse for these drugs will be monitored closely. A Controlled substance policy and procedure is on file which allows [facility] to order a urine drug screen test at any visit. Patient understands and agrees with the plan.. - Metabolic Test - phentermine 37.5 MG capsule; Take 1 capsule (37.5 mg total) by mouth every morning.  Dispense: 30 capsule; Refill: 0  There is a liability release in patients' chart. There has been a 10 minute discussion about the side effects including but not limited to elevated blood pressure, anxiety, lack of sleep and dry mouth. Pt understands and will like to start/continue on appetite suppressant at this  time. There will be one month RX given at the time of visit with proper follow up. Nova diet plan with restricted calories is given to the pt. Pt understands and agrees with  plan of treatment General Counseling: olivea sonnen understanding of the findings of todays visit and agrees with plan of treatment. I have discussed any further diagnostic evaluation that may be needed or ordered today. We also reviewed her medications today. she has been encouraged to call the office with any questions or concerns that should arise related to todays visit.    Orders Placed This Encounter  Procedures  . Metabolic Test  . Lipid Panel With LDL/HDL Ratio  . CBC w/Diff/Platelet    Meds ordered this encounter  Medications  . phentermine 37.5 MG capsule    Sig: Take 1 capsule (37.5 mg total) by mouth every morning.    Dispense:  30 capsule    Refill:  0    Time spent: 25 Minutes   This patient was seen by Blima Ledger AGNP-C in Collaboration with Dr Lyndon Code as a part of collaborative care agreement     Natasha Moreno AGNP-C Internal medicine

## 2018-07-28 NOTE — Patient Instructions (Signed)
Exercising to Lose Weight Exercising can help you to lose weight. In order to lose weight through exercise, you need to do vigorous-intensity exercise. You can tell that you are exercising with vigorous intensity if you are breathing very hard and fast and cannot hold a conversation while exercising. Moderate-intensity exercise helps to maintain your current weight. You can tell that you are exercising at a moderate level if you have a higher heart rate and faster breathing, but you are still able to hold a conversation. How often should I exercise? Choose an activity that you enjoy and set realistic goals. Your health care provider can help you to make an activity plan that works for you. Exercise regularly as directed by your health care provider. This may include:  Doing resistance training twice each week, such as: ? Push-ups. ? Sit-ups. ? Lifting weights. ? Using resistance bands.  Doing a given intensity of exercise for a given amount of time. Choose from these options: ? 150 minutes of moderate-intensity exercise every week. ? 75 minutes of vigorous-intensity exercise every week. ? A mix of moderate-intensity and vigorous-intensity exercise every week.  Children, pregnant women, people who are out of shape, people who are overweight, and older adults may need to consult a health care provider for individual recommendations. If you have any sort of medical condition, be sure to consult your health care provider before starting a new exercise program. What are some activities that can help me to lose weight?  Walking at a rate of at least 4.5 miles an hour.  Jogging or running at a rate of 5 miles per hour.  Biking at a rate of at least 10 miles per hour.  Lap swimming.  Roller-skating or in-line skating.  Cross-country skiing.  Vigorous competitive sports, such as football, basketball, and soccer.  Jumping rope.  Aerobic dancing. How can I be more active in my day-to-day  activities?  Use the stairs instead of the elevator.  Take a walk during your lunch break.  If you drive, park your car farther away from work or school.  If you take public transportation, get off one stop early and walk the rest of the way.  Make all of your phone calls while standing up and walking around.  Get up, stretch, and walk around every 30 minutes throughout the day. What guidelines should I follow while exercising?  Do not exercise so much that you hurt yourself, feel dizzy, or get very short of breath.  Consult your health care provider prior to starting a new exercise program.  Wear comfortable clothes and shoes with good support.  Drink plenty of water while you exercise to prevent dehydration or heat stroke. Body water is lost during exercise and must be replaced.  Work out until you breathe faster and your heart beats faster. This information is not intended to replace advice given to you by your health care provider. Make sure you discuss any questions you have with your health care provider. Document Released: 10/11/2010 Document Revised: 02/14/2016 Document Reviewed: 02/09/2014 [employer] Interactive Patient Education  2018 [employer]  

## 2018-07-29 LAB — CBC WITH DIFFERENTIAL/PLATELET
Basophils Absolute: 0.1 10*3/uL (ref 0.0–0.2)
Basos: 1 %
EOS (ABSOLUTE): 0.3 10*3/uL (ref 0.0–0.4)
Eos: 4 %
Hematocrit: 32.4 % — ABNORMAL LOW (ref 34.0–46.6)
Hemoglobin: 9.8 g/dL — ABNORMAL LOW (ref 11.1–15.9)
Immature Grans (Abs): 0.1 10*3/uL (ref 0.0–0.1)
Immature Granulocytes: 1 %
Lymphocytes Absolute: 1.9 10*3/uL (ref 0.7–3.1)
Lymphs: 21 %
MCH: 16.1 pg — ABNORMAL LOW (ref 26.6–33.0)
MCHC: 30.2 g/dL — ABNORMAL LOW (ref 31.5–35.7)
MCV: 53 fL — ABNORMAL LOW (ref 79–97)
Monocytes Absolute: 0.5 10*3/uL (ref 0.1–0.9)
Monocytes: 5 %
Neutrophils Absolute: 6.2 10*3/uL (ref 1.4–7.0)
Neutrophils: 68 %
Platelets: 287 10*3/uL (ref 150–450)
RBC: 6.1 x10E6/uL — ABNORMAL HIGH (ref 3.77–5.28)
RDW: 23.6 % — ABNORMAL HIGH (ref 12.3–15.4)
WBC: 9.1 10*3/uL (ref 3.4–10.8)

## 2018-07-29 LAB — LIPID PANEL WITH LDL/HDL RATIO
Cholesterol, Total: 113 mg/dL (ref 100–199)
HDL: 58 mg/dL
LDL Calculated: 39 mg/dL (ref 0–99)
LDl/HDL Ratio: 0.7 ratio (ref 0.0–3.2)
Triglycerides: 81 mg/dL (ref 0–149)
VLDL Cholesterol Cal: 16 mg/dL (ref 5–40)

## 2018-08-25 ENCOUNTER — Encounter: Payer: Self-pay | Admitting: Adult Health

## 2018-08-25 ENCOUNTER — Ambulatory Visit (INDEPENDENT_AMBULATORY_CARE_PROVIDER_SITE_OTHER): Payer: BLUE CROSS/BLUE SHIELD | Admitting: Adult Health

## 2018-08-25 VITALS — BP 110/70 | HR 93 | Resp 16 | Ht 60.0 in | Wt 220.0 lb

## 2018-08-25 DIAGNOSIS — Z6841 Body Mass Index (BMI) 40.0 and over, adult: Secondary | ICD-10-CM

## 2018-08-25 DIAGNOSIS — R0683 Snoring: Secondary | ICD-10-CM | POA: Diagnosis not present

## 2018-08-25 DIAGNOSIS — E785 Hyperlipidemia, unspecified: Secondary | ICD-10-CM | POA: Diagnosis not present

## 2018-08-25 MED ORDER — PHENTERMINE HCL 37.5 MG PO CAPS
37.5000 mg | ORAL_CAPSULE | ORAL | 0 refills | Status: DC
Start: 2018-08-25 — End: 2018-09-27

## 2018-08-25 NOTE — Progress Notes (Signed)
[facility] [address] [place], [place] [address]  Internal MEDICINE  Office Visit Note  Patient Name: Natasha Moreno  [id number]  [id number]  Date of Service: 08/25/2018  Chief Complaint  Patient presents with  . Hyperlipidemia  . Medical Management of Chronic Issues    weight loss management     HPI  Patient is here for follow-up of hyperkalemia as well as medical management for weight loss.  Her most recent lipid panel was done beginning of November and shows excellent cholesterol within normal limits.  Patient has been taking 37.5 mg of phentermine daily for the last month and by our scale has gained 1 pound.  It is of note that the patient is wearing multiple layers of clothing as well as heavy boots today.  She has been exercising, and reports that she has attempted to cut down on the carbs in her diet.  I encouraged patient to continue exercise and to up her intensity of such.  We will continue phentermine for 1 more month and if no progress is made we will stop it.  She denies any chest pain, shortness of breath, or palpitations while taking the medication.  Current Medication: Outpatient Encounter Medications as of 08/25/2018  Medication Sig  . aspirin EC 325 MG tablet Take 1 tablet (325 mg total) by mouth daily.  Marland Kitchen atorvastatin (LIPITOR) 10 MG tablet Take 1 tablet (10 mg total) by mouth daily.  . cetirizine (ZYRTEC ALLERGY) 10 MG tablet Take 10 mg by mouth daily as needed for allergies.   . phentermine 37.5 MG capsule Take 1 capsule (37.5 mg total) by mouth every morning.  . [DISCONTINUED] phentermine 37.5 MG capsule Take 1 capsule (37.5 mg total) by mouth every morning.   Facility-Administered Encounter Medications as of 08/25/2018  Medication  . cyanocobalamin ((VITAMIN B-12)) injection 1,000 mcg    Surgical History: Past Surgical History:  Procedure Laterality Date  . CHOLECYSTECTOMY    . TEE WITHOUT CARDIOVERSION N/A 11/11/2017   Procedure:  TRANSESOPHAGEAL ECHOCARDIOGRAM (TEE);  Surgeon: Dalia Heading, MD;  Location: [facility] ORS;  Service: Cardiovascular;  Laterality: N/A;  . TUBAL LIGATION    . WISDOM TOOTH EXTRACTION      Medical History: Past Medical History:  Diagnosis Date  . Hemoglobin E-A disorder (HCC)   . Hyperlipidemia   . Migraine   . Morbid obesity (HCC)   . Stroke [facility])     Family History: Family History  Problem Relation Age of Onset  . Kidney failure Father   . Heart attack Father        open heart surgery  . Hypertension Father   . Diabetes Father   . Gout Father   . Cataracts Father     Social History   Socioeconomic History  . Marital status: Single    Spouse name: Not on file  . Number of children: 2  . Years of education: Not on file  . Highest education level: High school graduate  Occupational History  . Not on file  Social Needs  . Financial resource strain: Not hard at all  . Food insecurity:    Worry: Never true    Inability: Never true  . Transportation needs:    Medical: No    Non-medical: No  Tobacco Use  . Smoking status: Former Smoker    Packs/day: 0.25    Types: Cigarettes    Last attempt to quit: 11/09/2017    Years since quitting: 0.7  . Smokeless tobacco:  Never Used  . Tobacco comment: approximately 4 cigarettes a day  Substance and Sexual Activity  . Alcohol use: Yes    Frequency: Never    Comment: every other weekend, not much   . Drug use: No  . Sexual activity: Yes    Birth control/protection: Condom  Lifestyle  . Physical activity:    Days per week: 3 days    Minutes per session: 50 min  . Stress: Not at all  Relationships  . Social connections:    Talks on phone: More than three times a week    Gets together: Once a week    Attends religious service: Never    Active member of club or organization: No    Attends meetings of clubs or organizations: Never    Relationship status: Not on file  . Intimate partner violence:    Fear of current or ex  partner: Not on file    Emotionally abused: Not on file    Physically abused: Not on file    Forced sexual activity: Not on file  Other Topics Concern  . Not on file  Social History Narrative   Lives at home with her parents, brother and her children   Right handed   Drinks rare caffeine       Review of Systems  Constitutional: Negative for chills, fatigue and unexpected weight change.  HENT: Negative for congestion, rhinorrhea, sneezing and sore throat.   Eyes: Negative for photophobia, pain and redness.  Respiratory: Negative for cough, chest tightness and shortness of breath.   Cardiovascular: Negative for chest pain and palpitations.  Gastrointestinal: Negative for abdominal pain, constipation, diarrhea, nausea and vomiting.  Endocrine: Negative.   Genitourinary: Negative for dysuria and frequency.  Musculoskeletal: Negative for arthralgias, back pain, joint swelling and neck pain.  Skin: Negative for rash.  Allergic/Immunologic: Negative.   Neurological: Negative for tremors and numbness.  Hematological: Negative for adenopathy. Does not bruise/bleed easily.  Psychiatric/Behavioral: Negative for behavioral problems and sleep disturbance. The patient is not nervous/anxious.     Vital Signs: BP 110/70 (BP Location: Left Arm, Patient Position: Sitting, Cuff Size: Normal)   Pulse 93   Resp 16   Ht 5' (1.524 m)   Wt 220 lb (99.8 kg)   SpO2 98%   BMI 42.97 kg/m    Physical Exam  Constitutional: She is oriented to person, place, and time. She appears well-developed and well-nourished. No distress.  HENT:  Head: Normocephalic and atraumatic.  Mouth/Throat: Oropharynx is clear and moist. No oropharyngeal exudate.  Eyes: Pupils are equal, round, and reactive to light. EOM are normal.  Neck: Normal range of motion. Neck supple. No JVD present. No tracheal deviation present. No thyromegaly present.  Cardiovascular: Normal rate, regular rhythm and normal heart sounds. Exam  reveals no gallop and no friction rub.  No murmur heard. Pulmonary/Chest: Effort normal and breath sounds normal. No respiratory distress. She has no wheezes. She has no rales. She exhibits no tenderness.  Abdominal: Soft. There is no tenderness. There is no guarding.  Musculoskeletal: Normal range of motion.  Lymphadenopathy:    She has no cervical adenopathy.  Neurological: She is alert and oriented to person, place, and time. No cranial nerve deficit.  Skin: Skin is warm and dry. She is not diaphoretic.  Psychiatric: She has a normal mood and affect. Her behavior is normal. Judgment and thought content normal.  Nursing note and vitals reviewed.  Assessment/Plan: 1. Hyperlipidemia, unspecified hyperlipidemia type Most recent lipid  panel shows cholesterol within normal limits.  2. Class 3 severe obesity due to excess calories with serious comorbidity and body mass index (BMI) of 40.0 to 44.9 in adult [facility]) Obesity Counseling: Risk Assessment: An assessment of behavioral risk factors was made today and includes lack of exercise sedentary lifestyle, lack of portion control and poor dietary habits.  Risk Modification Advice: She was counseled on portion control guidelines. Restricting daily caloric intake to. . The detrimental long term effects of obesity on her health and ongoing poor compliance was also discussed with the patient.  Refilled Controlled medications today. Reviewed risks and possible side effects associated with taking Stimulants. Combination of these drugs with other psychotropic medications could cause dizziness and drowsiness. Pt needs to Monitor symptoms and exercise caution in driving and operating heavy machinery to avoid damages to oneself, to others and to the surroundings. Patient verbalized understanding in this matter. Dependence and abuse for these drugs will be monitored closely. A Controlled substance policy and procedure is on file which allows [facility] to order a urine drug screen test at any visit. Patient understands and agrees with the plan.. - phentermine 37.5 MG capsule; Take 1 capsule (37.5 mg total) by mouth every morning.  Dispense: 30 capsule; Refill: 0  There is a liability release in patients' chart. There has been a 10 minute discussion about the side effects including but not limited to elevated blood pressure, anxiety, lack of sleep and dry mouth. Pt understands and will like to start/continue on appetite suppressant at this time. There will be one month RX given at the time of visit with proper follow up. Nova diet plan with restricted calories is given to the pt. Pt understands and agrees with  plan of treatment  3. Snoring Patient reports loud snoring.  She did have a sleep study done earlier this year that had an AHI index of 1.5.  I encouraged patient to continue to attempt to lose weight as that could alleviate some of her snoring symptoms.  General Counseling: tyrena gohr understanding of the findings of todays visit and agrees with plan of treatment. I have discussed any further diagnostic evaluation that may be needed or ordered today. We also reviewed her medications today. she has been encouraged to call the office with any questions or concerns that should arise related to todays visit.    No orders of the defined types were placed in this encounter.   Meds ordered this encounter  Medications  . phentermine 37.5 MG capsule    Sig: Take 1 capsule (37.5 mg total) by mouth every morning.    Dispense:  30 capsule    Refill:  0    Time spent: 25 Minutes   This patient was seen by Blima Ledger AGNP-C in Collaboration with Dr Lyndon Code as a part of collaborative care agreement     Johnna Acosta AGNP-C Internal medicine

## 2018-08-25 NOTE — Patient Instructions (Signed)

## 2018-09-27 ENCOUNTER — Encounter: Payer: Self-pay | Admitting: Adult Health

## 2018-09-27 ENCOUNTER — Ambulatory Visit (INDEPENDENT_AMBULATORY_CARE_PROVIDER_SITE_OTHER): Payer: BLUE CROSS/BLUE SHIELD | Admitting: Adult Health

## 2018-09-27 VITALS — BP 120/80 | HR 85 | Resp 16 | Ht 60.0 in | Wt 215.0 lb

## 2018-09-27 DIAGNOSIS — E785 Hyperlipidemia, unspecified: Secondary | ICD-10-CM

## 2018-09-27 DIAGNOSIS — K5903 Drug induced constipation: Secondary | ICD-10-CM | POA: Diagnosis not present

## 2018-09-27 DIAGNOSIS — Z6841 Body Mass Index (BMI) 40.0 and over, adult: Secondary | ICD-10-CM

## 2018-09-27 MED ORDER — PHENTERMINE HCL 37.5 MG PO CAPS: 37.5000 mg | ORAL_CAPSULE | ORAL | 0 refills | Status: DC

## 2018-09-27 NOTE — Progress Notes (Signed)
[facility] [address] [place], [place] [id number]  Internal MEDICINE  Office Visit Note  Patient Name: Natasha BoschDorothy Moreno  [id number]  [id number]  Date of Service: 09/27/2018  Chief Complaint  Patient presents with  . Medical Management of Chronic Issues    weight Management  . Hyperlipidemia    HPI Pt is here for follow up on hyperlipidemia, and weight loss.  She has been taking her phentermine daily with good results.  She has lost 5 pounds since her last visit.  She denies chest pain, palpitations, or other side effects of the medication.       Current Medication: Outpatient Encounter Medications as of 09/27/2018  Medication Sig  . aspirin EC 325 MG tablet Take 1 tablet (325 mg total) by mouth daily.  Marland Kitchen. atorvastatin (LIPITOR) 10 MG tablet Take 1 tablet (10 mg total) by mouth daily.  . cetirizine (ZYRTEC ALLERGY) 10 MG tablet Take 10 mg by mouth daily as needed for allergies.   . phentermine 37.5 MG capsule Take 1 capsule (37.5 mg total) by mouth every morning.  . [DISCONTINUED] phentermine 37.5 MG capsule Take 1 capsule (37.5 mg total) by mouth every morning.   Facility-Administered Encounter Medications as of 09/27/2018  Medication  . cyanocobalamin ((VITAMIN B-12)) injection 1,000 mcg    Surgical History: Past Surgical History:  Procedure Laterality Date  . CHOLECYSTECTOMY    . TEE WITHOUT CARDIOVERSION N/A 11/11/2017   Procedure: TRANSESOPHAGEAL ECHOCARDIOGRAM (TEE);  Surgeon: Dalia HeadingFath, Kenneth A, MD;  Location: [facility] ORS;  Service: Cardiovascular;  Laterality: N/A;  . TUBAL LIGATION    . WISDOM TOOTH EXTRACTION      Medical History: Past Medical History:  Diagnosis Date  . Hemoglobin E-A disorder (HCC)   . Hyperlipidemia   . Migraine   . Morbid obesity (HCC)   . Stroke [facility] [place] [facility](HCC)     Family History: Family History  Problem Relation Age of Onset  . Kidney failure Father   . Heart attack Father        open heart surgery  . Hypertension Father   .  Diabetes Father   . Gout Father   . Cataracts Father     Social History   Socioeconomic History  . Marital status: Single    Spouse name: Not on file  . Number of children: 2  . Years of education: Not on file  . Highest education level: High school graduate  Occupational History  . Not on file  Social Needs  . Financial resource strain: Not hard at all  . Food insecurity:    Worry: Never true    Inability: Never true  . Transportation needs:    Medical: No    Non-medical: No  Tobacco Use  . Smoking status: Former Smoker    Packs/day: 0.25    Types: Cigarettes    Last attempt to quit: 11/09/2017    Years since quitting: 0.8  . Smokeless tobacco: Never Used  . Tobacco comment: approximately 4 cigarettes a day  Substance and Sexual Activity  . Alcohol use: Yes    Frequency: Never    Comment: every other weekend, not much   . Drug use: No  . Sexual activity: Yes    Birth control/protection: Condom  Lifestyle  . Physical activity:    Days per week: 3 days    Minutes per session: 50 min  . Stress: Not at all  Relationships  . Social connections:    Talks on phone: More than three times  a week    Gets together: Once a week    Attends religious service: Never    Active member of club or organization: No    Attends meetings of clubs or organizations: Never    Relationship status: Not on file  . Intimate partner violence:    Fear of current or ex partner: Not on file    Emotionally abused: Not on file    Physically abused: Not on file    Forced sexual activity: Not on file  Other Topics Concern  . Not on file  Social History Narrative   Lives at home with her parents, brother and her children   Right handed   Drinks rare caffeine       Review of Systems  Constitutional: Negative for chills, fatigue and unexpected weight change.  HENT: Negative for congestion, rhinorrhea, sneezing and sore throat.   Eyes: Negative for photophobia, pain and redness.   Respiratory: Negative for cough, chest tightness and shortness of breath.   Cardiovascular: Negative for chest pain and palpitations.  Gastrointestinal: Negative for abdominal pain, constipation, diarrhea, nausea and vomiting.  Endocrine: Negative.   Genitourinary: Negative for dysuria and frequency.  Musculoskeletal: Negative for arthralgias, back pain, joint swelling and neck pain.  Skin: Negative for rash.  Allergic/Immunologic: Negative.   Neurological: Negative for tremors and numbness.  Hematological: Negative for adenopathy. Does not bruise/bleed easily.  Psychiatric/Behavioral: Negative for behavioral problems and sleep disturbance. The patient is not nervous/anxious.     Vital Signs: BP 120/80   Pulse 85   Resp 16   Ht 5' (1.524 m)   Wt 215 lb (97.5 kg)   SpO2 98%   BMI 41.99 kg/m    Physical Exam Vitals signs and nursing note reviewed.  Constitutional:      General: She is not in acute distress.    Appearance: She is well-developed. She is not diaphoretic.  HENT:     Head: Normocephalic and atraumatic.     Mouth/Throat:     Pharynx: No oropharyngeal exudate.  Eyes:     Pupils: Pupils are equal, round, and reactive to light.  Neck:     Musculoskeletal: Normal range of motion and neck supple.     Thyroid: No thyromegaly.     Vascular: No JVD.     Trachea: No tracheal deviation.  Cardiovascular:     Rate and Rhythm: Normal rate and regular rhythm.     Heart sounds: Normal heart sounds. No murmur. No friction rub. No gallop.   Pulmonary:     Effort: Pulmonary effort is normal. No respiratory distress.     Breath sounds: Normal breath sounds. No wheezing or rales.  Chest:     Chest wall: No tenderness.  Abdominal:     Palpations: Abdomen is soft.     Tenderness: There is no abdominal tenderness. There is no guarding.  Musculoskeletal: Normal range of motion.  Lymphadenopathy:     Cervical: No cervical adenopathy.  Skin:    General: Skin is warm and dry.   Neurological:     Mental Status: She is alert and oriented to person, place, and time.     Cranial Nerves: No cranial nerve deficit.  Psychiatric:        Behavior: Behavior normal.        Thought Content: Thought content normal.        Judgment: Judgment normal.    Assessment/Plan: 72 mcg linzess 1. Hyperlipidemia, unspecified hyperlipidemia type Patient is likely panel is  within normal limits.  Patient continue take medications as prescribed.  2. Class 3 severe obesity due to excess calories with serious comorbidity and body mass index (BMI) of 40.0 to 44.9 in adult [facility]) Obesity Counseling: Risk Assessment: An assessment of behavioral risk factors was made today and includes lack of exercise sedentary lifestyle, lack of portion control and poor dietary habits.  Risk Modification Advice: She was counseled on portion control guidelines. Restricting daily caloric intake to. . The detrimental long term effects of obesity on her health and ongoing poor compliance was also discussed with the patient.  There is a liability release in patients' chart. There has been a 10 minute discussion about the side effects including but not limited to elevated blood pressure, anxiety, lack of sleep and dry mouth. Pt understands and will like to start/continue on appetite suppressant at this time. There will be one month RX given at the time of visit with proper follow up. Nova diet plan with restricted calories is given to the pt. Pt understands and agrees with  plan of treatment - phentermine 37.5 MG capsule; Take 1 capsule (37.5 mg total) by mouth every morning.  Dispense: 30 capsule; Refill: 0  3. Drug-induced constipation Encouraged patient to increase fluid intake.  Also try OTC Docusate sodium. If this does not work, she is given Linzess sample to take one tab daily.   General Counseling: tyarra nolton understanding of the findings of todays visit and agrees with plan of treatment. I  have discussed any further diagnostic evaluation that may be needed or ordered today. We also reviewed her medications today. she has been encouraged to call the office with any questions or concerns that should arise related to todays visit.    No orders of the defined types were placed in this encounter.   Meds ordered this encounter  Medications  . phentermine 37.5 MG capsule    Sig: Take 1 capsule (37.5 mg total) by mouth every morning.    Dispense:  30 capsule    Refill:  0    Time spent: 25 Minutes   This patient was seen by Blima Ledger AGNP-C in Collaboration with Dr Lyndon Code as a part of collaborative care agreement     Johnna Acosta AGNP-C Internal medicine

## 2018-09-27 NOTE — Patient Instructions (Signed)
Phentermine tablets or capsules What is this medicine? PHENTERMINE (FEN ter meen) decreases your appetite. It is used with a reduced calorie diet and exercise to help you lose weight. This medicine may be used for other purposes; ask your health care provider or pharmacist if you have questions. COMMON BRAND NAME(S): Adipex-P, Atti-Plex P, Atti-Plex P Spansule, Fastin, Lomaira, Pro-Fast, Tara-8 What should I tell my health care provider before I take this medicine? They need to know if you have any of these conditions: -agitation or nervousness -diabetes -glaucoma -heart disease -high blood pressure -history of drug abuse or addiction -history of stroke -kidney disease -lung disease called Primary Pulmonary Hypertension (PPH) -taken an MAOI like Carbex, Eldepryl, Marplan, Nardil, or Parnate in last 14 days -taking stimulant medicines for attention disorders, weight loss, or to stay awake -thyroid disease -an unusual or allergic reaction to phentermine, other medicines, foods, dyes, or preservatives -pregnant or trying to get pregnant -breast-feeding How should I use this medicine? Take this medicine by mouth with a glass of water. Follow the directions on the prescription label. The instructions for use may differ based on the product and dose you are taking. Avoid taking this medicine in the evening. It may interfere with sleep. Take your doses at regular intervals. Do not take your medicine more often than directed. Talk to your pediatrician regarding the use of this medicine in children. While this drug may be prescribed for children 17 years or older for selected conditions, precautions do apply. Overdosage: If you think you have taken too much of this medicine contact a poison control center or emergency room at once. NOTE: This medicine is only for you. Do not share this medicine with others. What if I miss a dose? If you miss a dose, take it as soon as you can. If it is almost time  for your next dose, take only that dose. Do not take double or extra doses. What may interact with this medicine? Do not take this medicine with any of the following medications: -MAOIs like Carbex, Eldepryl, Marplan, Nardil, and Parnate -medicines for colds or breathing difficulties like pseudoephedrine or phenylephrine -procarbazine -sibutramine -stimulant medicines for attention disorders, weight loss, or to stay awake This medicine may also interact with the following medications: -certain medicines for depression, anxiety, or psychotic disturbances -linezolid -medicines for diabetes -medicines for high blood pressure This list may not describe all possible interactions. Give your health care provider a list of all the medicines, herbs, non-prescription drugs, or dietary supplements you use. Also tell them if you smoke, drink alcohol, or use illegal drugs. Some items may interact with your medicine. What should I watch for while using this medicine? Notify your physician immediately if you become short of breath while doing your normal activities. Do not take this medicine within 6 hours of bedtime. It can keep you from getting to sleep. Avoid drinks that contain caffeine and try to stick to a regular bedtime every night. This medicine was intended to be used in addition to a healthy diet and exercise. The best results are achieved this way. This medicine is only indicated for short-term use. Eventually your weight loss may level out. At that point, the drug will only help you maintain your new weight. Do not increase or in any way change your dose without consulting your doctor. You may get drowsy or dizzy. Do not drive, use machinery, or do anything that needs mental alertness until you know how this medicine affects you. Do   not stand or sit up quickly, especially if you are an older patient. This reduces the risk of dizzy or fainting spells. Alcohol may increase dizziness and drowsiness.  Avoid alcoholic drinks. What side effects may I notice from receiving this medicine? Side effects that you should report to your doctor or health care professional as soon as possible: -allergic reactions like skin rash, itching or hives, swelling of the face, lips, or tongue) -anxiety -breathing problems -changes in vision -chest pain or chest tightness -depressed mood or other mood changes -hallucinations, loss of contact with reality -fast, irregular heartbeat -increased blood pressure -irritable -nervousness or restlessness -painful urination -palpitations -tremors -trouble sleeping -seizures -signs and symptoms of a stroke like changes in vision; confusion; trouble speaking or understanding; severe headaches; sudden numbness or weakness of the face, arm or leg; trouble walking; dizziness; loss of balance or coordination -unusually weak or tired -vomiting Side effects that usually do not require medical attention (report to your doctor or health care professional if they continue or are bothersome): -constipation or diarrhea -dry mouth -headache -nausea -stomach upset -sweating This list may not describe all possible side effects. Call your doctor for medical advice about side effects. You may report side effects to FDA at 1-800-FDA-1088. Where should I keep my medicine? Keep out of the reach of children. This medicine can be abused. Keep your medicine in a safe place to protect it from theft. Do not share this medicine with anyone. Selling or giving away this medicine is dangerous and against the law. This medicine may cause accidental overdose and death if taken by other adults, children, or pets. Mix any unused medicine with a substance like cat litter or coffee grounds. Then throw the medicine away in a sealed container like a sealed bag or a coffee can with a lid. Do not use the medicine after the expiration date. Store at room temperature between 20 and 25 degrees C (68 and  77 degrees F). Keep container tightly closed. NOTE: This sheet is a summary. It may not cover all possible information. If you have questions about this medicine, talk to your doctor, pharmacist, or health care provider.  2019 Elsevier/Gold Standard (2017-02-20 08:23:13)  

## 2018-10-14 IMAGING — MR MR HEAD W/O CM
10 series · 37 of 48 positions shown · non-contrast
Comparison: CT examinations 11/09/2017

CLINICAL DATA: Left-sided numbness including the left side of the
face beginning 24 hours ago.

EXAM:
MRI HEAD WITHOUT CONTRAST
TECHNIQUE: Multiplanar, multiecho pulse sequences of the brain and surrounding
structures were obtained without intravenous contrast.

[Series 2: T1 · sagittal · 5.0mm · 0.47mm/px · 4 of 25 slices shown (1 of 2)]
[im 1/25]
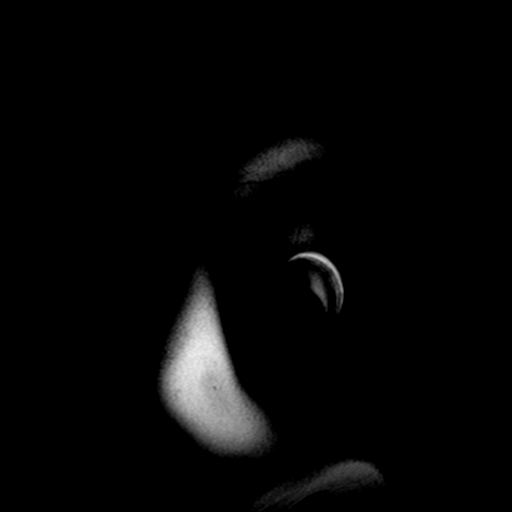
[im 9/25]
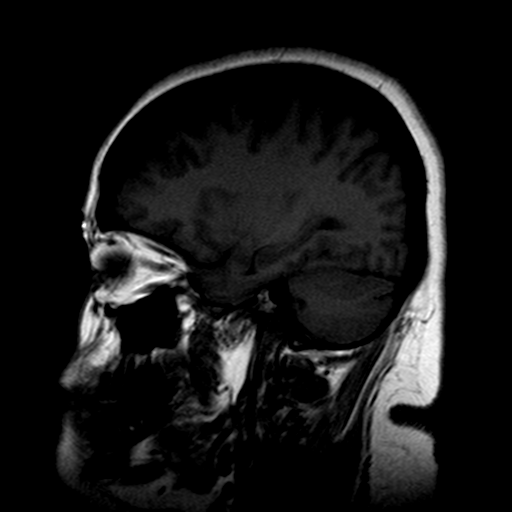
[im 17/25]
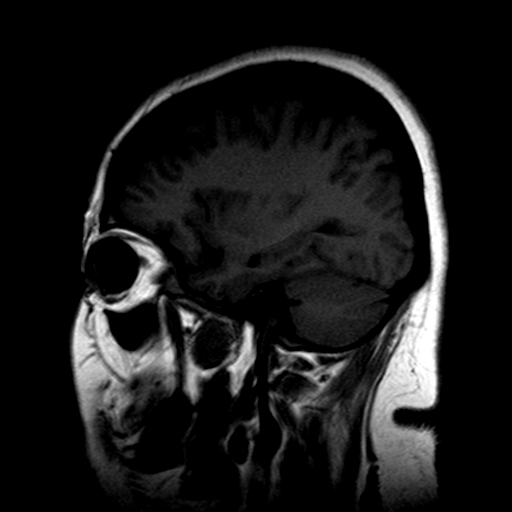
[im 25/25]
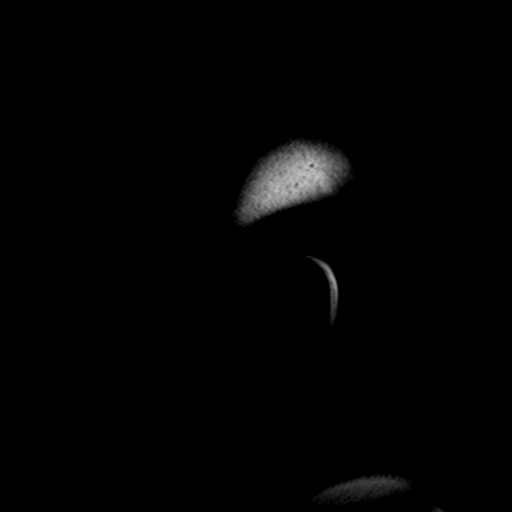

[Series 4: DWI · axial · 3.0mm · 0.94mm/px · z∈[-71,+76]mm · 5 of 50 slices shown (1 of 4)]
[im 1/50]
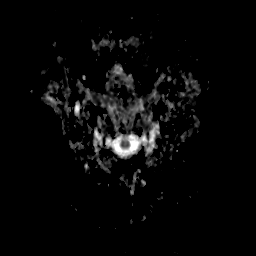
[im 13/50]
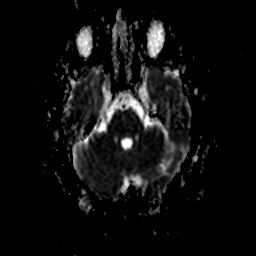
[im 25/50]
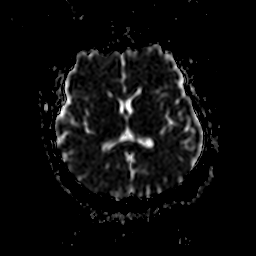
[im 37/50]
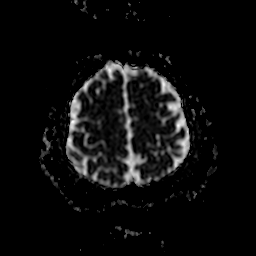
[im 50/50]
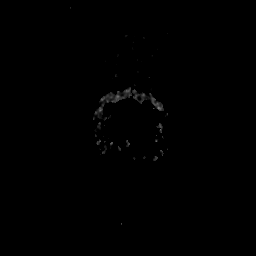

[Series 5: DWI · axial · 3.0mm · 0.94mm/px · z∈[-71,+76]mm · 5 of 50 slices shown (2 of 4)]
[im 1/50]
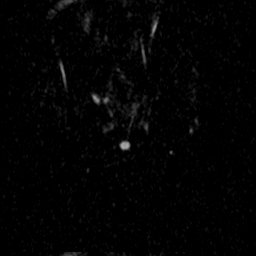
[im 13/50]
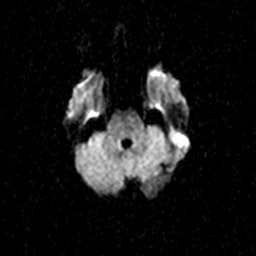
[im 25/50]
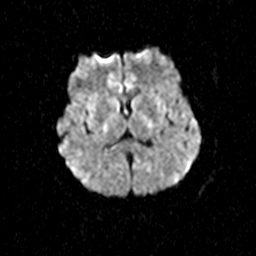
[im 37/50]
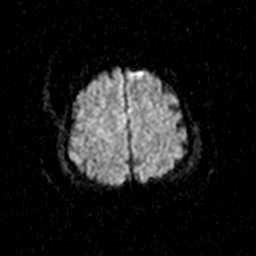
[im 50/50]
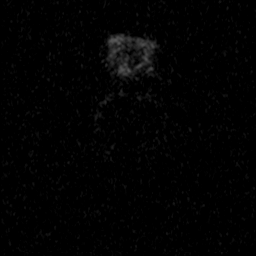

[Series 7: DWI · coronal · 5.0mm · 1.80mm/px · 3 of 33 slices shown (3 of 4)]
[im 1/33]
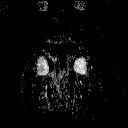
[im 17/33]
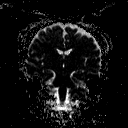
[im 33/33]
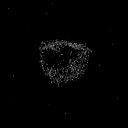

[Series 8: DWI · coronal · 5.0mm · 1.80mm/px · 3 of 32 slices shown (4 of 4)]
[im 1/32]
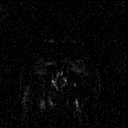
[im 16/32]
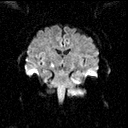
[im 32/32]
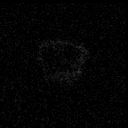

[Series 9: T2 · axial · 5.0mm · 0.45mm/px · z∈[-75,+79]mm · 2 of 23 slices shown (1 of 3)]
[im 1/23]
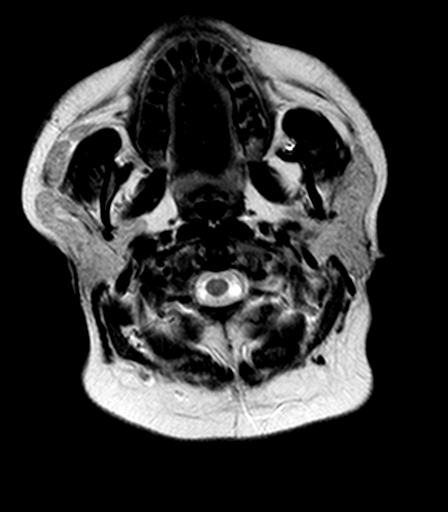
[im 23/23]
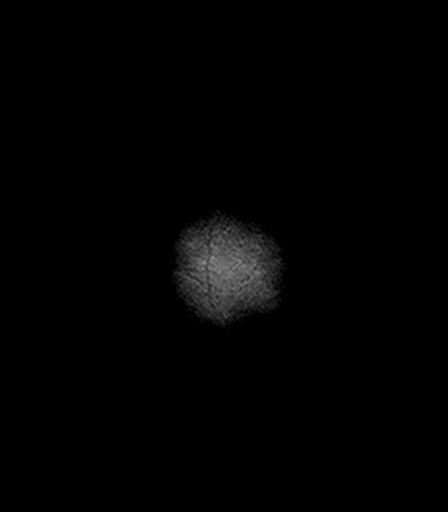

[Series 10: FLAIR · axial · 3.0mm · 0.90mm/px · z∈[-72,+75]mm · 5 of 50 slices shown]
[im 1/50]
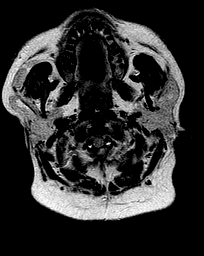
[im 13/50]
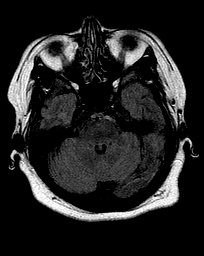
[im 25/50]
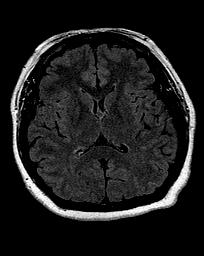
[im 37/50]
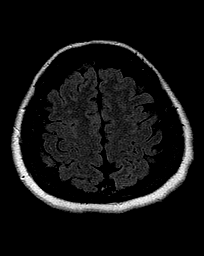
[im 50/50]
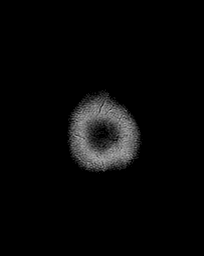

[Series 11: T2 · axial · 5.0mm · 0.45mm/px · z∈[-75,+79]mm · 2 of 23 slices shown (2 of 3)]
[im 1/23]
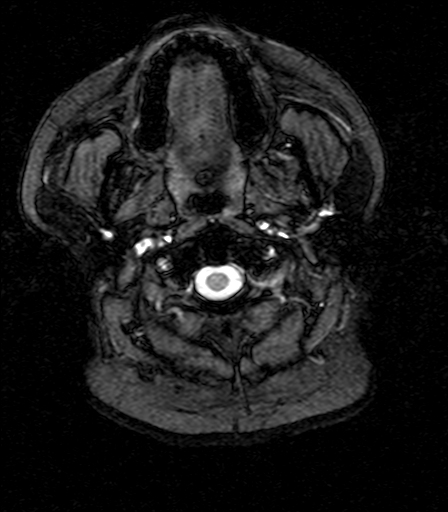
[im 23/23]
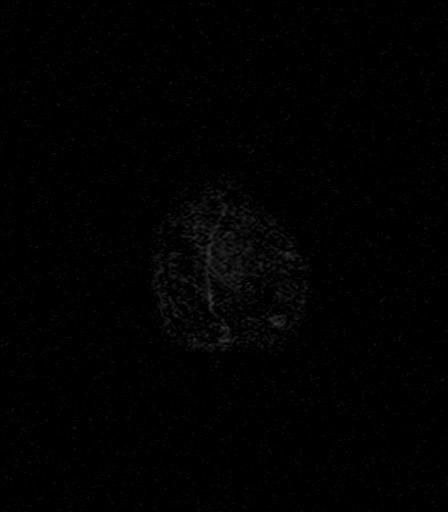

[Series 12: T1 · axial · 1.0mm · 0.45mm/px · z∈[-68,+17]mm · 5 of 160 slices shown (2 of 2)]
[im 11/160]
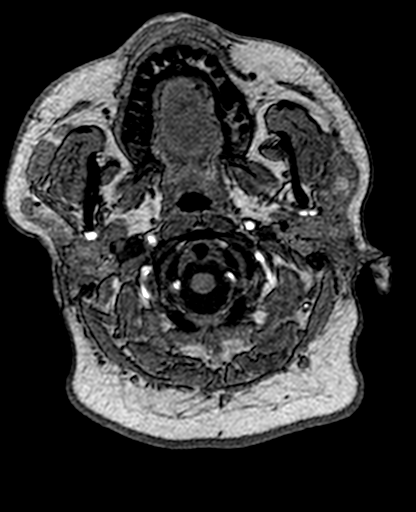
[im 32/160]
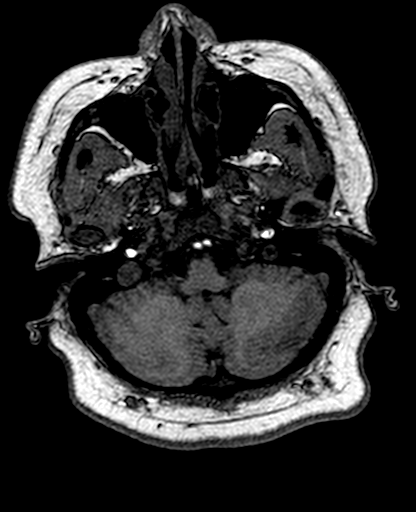
[im 54/160]
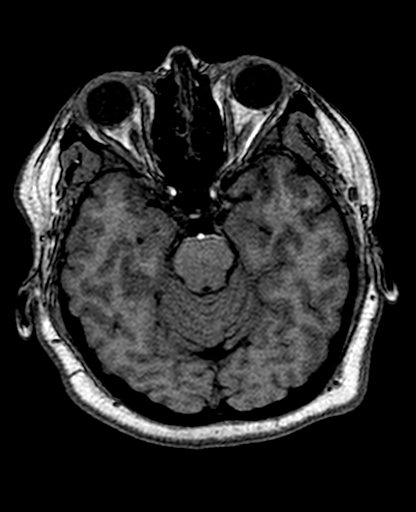
[im 75/160]
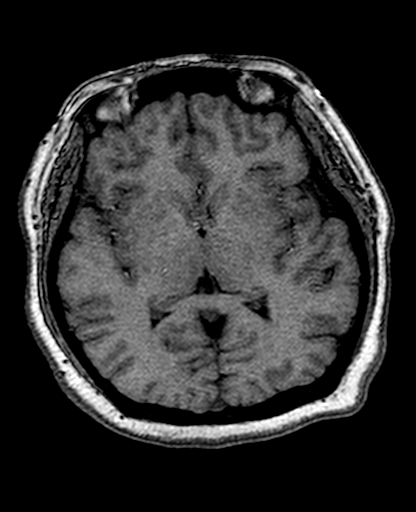
[im 96/160]
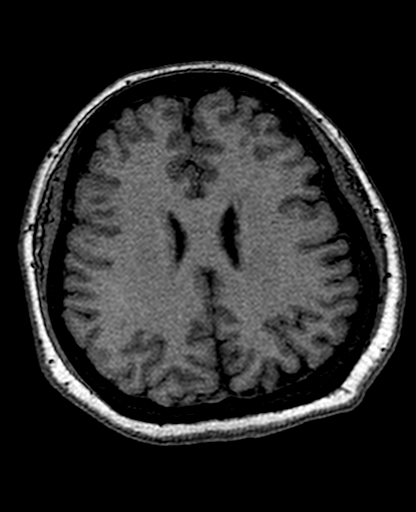

[Series 13: T2 · coronal · 5.0mm · 0.45mm/px · 3 of 25 slices shown (3 of 3)]
[im 1/25]
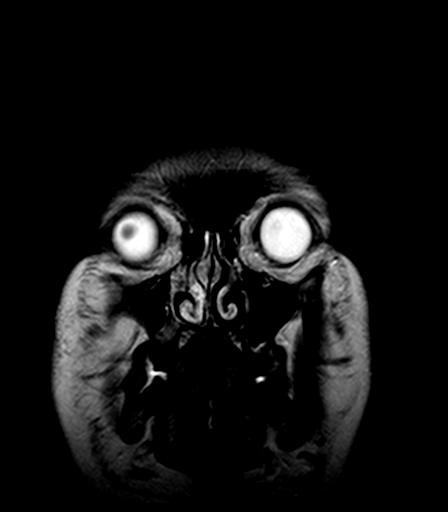
[im 13/25]
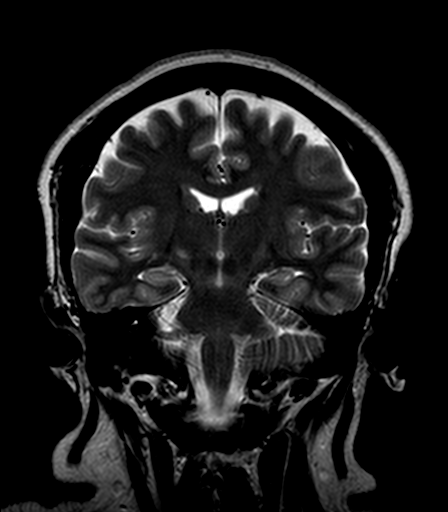
[im 25/25]
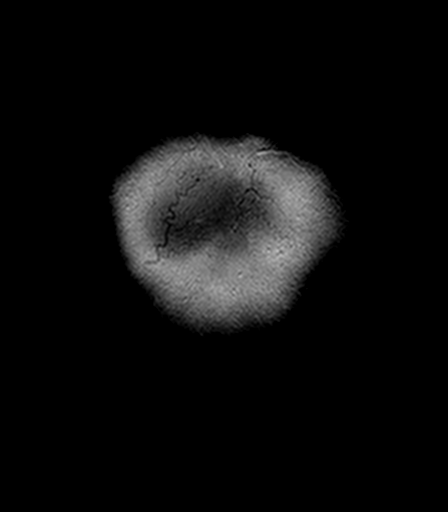

[37 of 48 positions shown; findings below may reference images not displayed]

FINDINGS: Brain: Diffusion imaging shows a subcentimeter acute infarction at
the base of the brain on the right, most consistent with the
cortical spinal tract in location. There could be some involvement
of the lateral thalamus. Elsewhere, the brain has a normal
appearance. No other small or large vessel infarction. No mass
lesion, hemorrhage, hydrocephalus or extra-axial collection.

Vascular: Major vessels at the base of the brain show flow.

Skull and upper cervical spine: Negative

Sinuses/Orbits: Clear/normal

Other: None
IMPRESSION: Subcentimeter acute infarction at the base of the brain on the right
consistent with location in the corticospinal tract, possibly with
minor involvement of the lateral thalamus. No evidence of swelling
or hemorrhage. Otherwise normal study.

## 2018-10-25 ENCOUNTER — Encounter: Payer: Self-pay | Admitting: Adult Health

## 2018-10-25 ENCOUNTER — Ambulatory Visit (INDEPENDENT_AMBULATORY_CARE_PROVIDER_SITE_OTHER): Payer: BLUE CROSS/BLUE SHIELD | Admitting: Adult Health

## 2018-10-25 VITALS — BP 122/62 | HR 89 | Resp 16 | Ht 60.0 in | Wt 213.0 lb

## 2018-10-25 DIAGNOSIS — E785 Hyperlipidemia, unspecified: Secondary | ICD-10-CM | POA: Diagnosis not present

## 2018-10-25 DIAGNOSIS — Z6841 Body Mass Index (BMI) 40.0 and over, adult: Secondary | ICD-10-CM

## 2018-10-25 MED ORDER — PHENTERMINE HCL 37.5 MG PO CAPS: 37.5000 mg | ORAL_CAPSULE | ORAL | 0 refills | Status: DC

## 2018-10-25 NOTE — Progress Notes (Signed)
[facility] [address] [place], [place] [address]  Internal MEDICINE  Office Visit Note  Patient Name: Natasha BoschDorothy Moreno  [id number]  [id number]  Date of Service: 11/15/2018  Chief Complaint  Patient presents with  . Medical Management of Chronic Issues    4 week follow up    HPI PT is here follow up on HLD and for weight loss management. Patient is using phentermine for weight loss.  Since our last visit they have lost 2 pounds.  The patient denies any chest pain, palpitations, shortness of breath, constipation, headaches or any other side effects of the medication.  Patient wishes to continue to use this medication for weight loss at this time.    Current Medication: Outpatient Encounter Medications as of 10/25/2018  Medication Sig  . aspirin EC 325 MG tablet Take 1 tablet (325 mg total) by mouth daily.  Marland Kitchen. atorvastatin (LIPITOR) 10 MG tablet Take 1 tablet (10 mg total) by mouth daily.  . cetirizine (ZYRTEC ALLERGY) 10 MG tablet Take 10 mg by mouth daily as needed for allergies.   . phentermine 37.5 MG capsule Take 1 capsule (37.5 mg total) by mouth every morning.  . [DISCONTINUED] phentermine 37.5 MG capsule Take 1 capsule (37.5 mg total) by mouth every morning.   Facility-Administered Encounter Medications as of 10/25/2018  Medication  . cyanocobalamin ((VITAMIN B-12)) injection 1,000 mcg    Surgical History: Past Surgical History:  Procedure Laterality Date  . CHOLECYSTECTOMY    . TEE WITHOUT CARDIOVERSION N/A 11/11/2017   Procedure: TRANSESOPHAGEAL ECHOCARDIOGRAM (TEE);  Surgeon: Dalia HeadingFath, Kenneth A, MD;  Location: [facility] ORS;  Service: Cardiovascular;  Laterality: N/A;  . TUBAL LIGATION    . WISDOM TOOTH EXTRACTION      Medical History: Past Medical History:  Diagnosis Date  . Hemoglobin E-A disorder (HCC)   . Hyperlipidemia   . Migraine   . Morbid obesity (HCC)   . Stroke [facility](HCC)     Family History: Family History  Problem Relation Age of Onset  . Kidney  failure Father   . Heart attack Father        open heart surgery  . Hypertension Father   . Diabetes Father   . Gout Father   . Cataracts Father     Social History   Socioeconomic History  . Marital status: Single    Spouse name: Not on file  . Number of children: 2  . Years of education: Not on file  . Highest education level: High school graduate  Occupational History  . Not on file  Social Needs  . Financial resource strain: Not hard at all  . Food insecurity:    Worry: Never true    Inability: Never true  . Transportation needs:    Medical: No    Non-medical: No  Tobacco Use  . Smoking status: Former Smoker    Packs/day: 0.25    Types: Cigarettes    Last attempt to quit: 11/09/2017    Years since quitting: 1.0  . Smokeless tobacco: Never Used  . Tobacco comment: approximately 4 cigarettes a day  Substance and Sexual Activity  . Alcohol use: Yes    Frequency: Never    Comment: every other weekend, not much   . Drug use: No  . Sexual activity: Yes    Birth control/protection: Condom  Lifestyle  . Physical activity:    Days per week: 3 days    Minutes per session: 50 min  . Stress: Not at all  Relationships  . Social connections:    Talks on phone: More than three times a week    Gets together: Once a week    Attends religious service: Never    Active member of club or organization: No    Attends meetings of clubs or organizations: Never    Relationship status: Not on file  . Intimate partner violence:    Fear of current or ex partner: Not on file    Emotionally abused: Not on file    Physically abused: Not on file    Forced sexual activity: Not on file  Other Topics Concern  . Not on file  Social History Narrative   Lives at home with her parents, brother and her children   Right handed   Drinks rare caffeine       Review of Systems  Constitutional: Negative for chills, fatigue and unexpected weight change.  HENT: Negative for congestion,  rhinorrhea, sneezing and sore throat.   Eyes: Negative for photophobia, pain and redness.  Respiratory: Negative for cough, chest tightness and shortness of breath.   Cardiovascular: Negative for chest pain and palpitations.  Gastrointestinal: Negative for abdominal pain, constipation, diarrhea, nausea and vomiting.  Endocrine: Negative.   Genitourinary: Negative for dysuria and frequency.  Musculoskeletal: Negative for arthralgias, back pain, joint swelling and neck pain.  Skin: Negative for rash.  Allergic/Immunologic: Negative.   Neurological: Negative for tremors and numbness.  Hematological: Negative for adenopathy. Does not bruise/bleed easily.  Psychiatric/Behavioral: Negative for behavioral problems and sleep disturbance. The patient is not nervous/anxious.     Vital Signs: BP 122/62   Pulse 89   Resp 16   Ht 5' (1.524 m)   Wt 213 lb (96.6 kg)   SpO2 99%   BMI 41.60 kg/m    Physical Exam Vitals signs and nursing note reviewed.  Constitutional:      General: She is not in acute distress.    Appearance: She is well-developed. She is not diaphoretic.  HENT:     Head: Normocephalic and atraumatic.     Mouth/Throat:     Pharynx: No oropharyngeal exudate.  Eyes:     Pupils: Pupils are equal, round, and reactive to light.  Neck:     Musculoskeletal: Normal range of motion and neck supple.     Thyroid: No thyromegaly.     Vascular: No JVD.     Trachea: No tracheal deviation.  Cardiovascular:     Rate and Rhythm: Normal rate and regular rhythm.     Heart sounds: Normal heart sounds. No murmur. No friction rub. No gallop.   Pulmonary:     Effort: Pulmonary effort is normal. No respiratory distress.     Breath sounds: Normal breath sounds. No wheezing or rales.  Chest:     Chest wall: No tenderness.  Abdominal:     Palpations: Abdomen is soft.     Tenderness: There is no abdominal tenderness. There is no guarding.  Musculoskeletal: Normal range of motion.   Lymphadenopathy:     Cervical: No cervical adenopathy.  Skin:    General: Skin is warm and dry.  Neurological:     Mental Status: She is alert and oriented to person, place, and time.     Cranial Nerves: No cranial nerve deficit.  Psychiatric:        Behavior: Behavior normal.        Thought Content: Thought content normal.        Judgment: Judgment normal.  Assessment/Plan: 1. Hyperlipidemia, unspecified hyperlipidemia type Stable continue current medications.  2. Class 3 severe obesity due to excess calories with serious comorbidity and body mass index (BMI) of 40.0 to 44.9 in adult [facility]) Obesity Counseling: Risk Assessment: An assessment of behavioral risk factors was made today and includes lack of exercise sedentary lifestyle, lack of portion control and poor dietary habits.  Risk Modification Advice: She was counseled on portion control guidelines. Restricting daily caloric intake to. . The detrimental long term effects of obesity on her health and ongoing poor compliance was also discussed with the patient.  - phentermine 37.5 MG capsule; Take 1 capsule (37.5 mg total) by mouth every morning.  Dispense: 30 capsule; Refill: 0  General Counseling: Lashun verbalizes understanding of the findings of todays visit and agrees with plan of treatment. I have discussed any further diagnostic evaluation that may be needed or ordered today. We also reviewed her medications today. she has been encouraged to call the office with any questions or concerns that should arise related to todays visit.    No orders of the defined types were placed in this encounter.   Meds ordered this encounter  Medications  . phentermine 37.5 MG capsule    Sig: Take 1 capsule (37.5 mg total) by mouth every morning.    Dispense:  30 capsule    Refill:  0    Time spent: 25 Minutes   This patient was seen by Blima Ledger AGNP-C in Collaboration with Dr Lyndon Code as a part of collaborative care  agreement     Johnna Acosta AGNP-C Internal medicine

## 2018-11-15 ENCOUNTER — Encounter: Payer: Self-pay | Admitting: Adult Health

## 2018-11-29 ENCOUNTER — Encounter: Payer: Self-pay | Admitting: Adult Health

## 2018-11-29 ENCOUNTER — Ambulatory Visit (INDEPENDENT_AMBULATORY_CARE_PROVIDER_SITE_OTHER): Payer: BLUE CROSS/BLUE SHIELD | Admitting: Adult Health

## 2018-11-29 VITALS — BP 128/84 | HR 82 | Resp 16 | Ht 60.0 in | Wt 213.0 lb

## 2018-11-29 DIAGNOSIS — R5383 Other fatigue: Secondary | ICD-10-CM | POA: Diagnosis not present

## 2018-11-29 DIAGNOSIS — Z6841 Body Mass Index (BMI) 40.0 and over, adult: Secondary | ICD-10-CM | POA: Diagnosis not present

## 2018-11-29 DIAGNOSIS — E785 Hyperlipidemia, unspecified: Secondary | ICD-10-CM

## 2018-11-29 MED ORDER — PHENTERMINE HCL 37.5 MG PO CAPS: 37.5000 mg | ORAL_CAPSULE | ORAL | 0 refills | Status: DC

## 2018-11-29 NOTE — Patient Instructions (Signed)
Phentermine tablets or capsules What is this medicine? PHENTERMINE (FEN ter meen) decreases your appetite. It is used with a reduced calorie diet and exercise to help you lose weight. This medicine may be used for other purposes; ask your health care provider or pharmacist if you have questions. COMMON BRAND NAME(S): Adipex-P, Atti-Plex P, Atti-Plex P Spansule, Fastin, Lomaira, Pro-Fast, Tara-8 What should I tell my health care provider before I take this medicine? They need to know if you have any of these conditions: -agitation or nervousness -diabetes -glaucoma -heart disease -high blood pressure -history of drug abuse or addiction -history of stroke -kidney disease -lung disease called Primary Pulmonary Hypertension (PPH) -taken an MAOI like Carbex, Eldepryl, Marplan, Nardil, or Parnate in last 14 days -taking stimulant medicines for attention disorders, weight loss, or to stay awake -thyroid disease -an unusual or allergic reaction to phentermine, other medicines, foods, dyes, or preservatives -pregnant or trying to get pregnant -breast-feeding How should I use this medicine? Take this medicine by mouth with a glass of water. Follow the directions on the prescription label. The instructions for use may differ based on the product and dose you are taking. Avoid taking this medicine in the evening. It may interfere with sleep. Take your doses at regular intervals. Do not take your medicine more often than directed. Talk to your pediatrician regarding the use of this medicine in children. While this drug may be prescribed for children 17 years or older for selected conditions, precautions do apply. Overdosage: If you think you have taken too much of this medicine contact a poison control center or emergency room at once. NOTE: This medicine is only for you. Do not share this medicine with others. What if I miss a dose? If you miss a dose, take it as soon as you can. If it is almost time  for your next dose, take only that dose. Do not take double or extra doses. What may interact with this medicine? Do not take this medicine with any of the following medications: -MAOIs like Carbex, Eldepryl, Marplan, Nardil, and Parnate -medicines for colds or breathing difficulties like pseudoephedrine or phenylephrine -procarbazine -sibutramine -stimulant medicines for attention disorders, weight loss, or to stay awake This medicine may also interact with the following medications: -certain medicines for depression, anxiety, or psychotic disturbances -linezolid -medicines for diabetes -medicines for high blood pressure This list may not describe all possible interactions. Give your health care provider a list of all the medicines, herbs, non-prescription drugs, or dietary supplements you use. Also tell them if you smoke, drink alcohol, or use illegal drugs. Some items may interact with your medicine. What should I watch for while using this medicine? Notify your physician immediately if you become short of breath while doing your normal activities. Do not take this medicine within 6 hours of bedtime. It can keep you from getting to sleep. Avoid drinks that contain caffeine and try to stick to a regular bedtime every night. This medicine was intended to be used in addition to a healthy diet and exercise. The best results are achieved this way. This medicine is only indicated for short-term use. Eventually your weight loss may level out. At that point, the drug will only help you maintain your new weight. Do not increase or in any way change your dose without consulting your doctor. You may get drowsy or dizzy. Do not drive, use machinery, or do anything that needs mental alertness until you know how this medicine affects you. Do   not stand or sit up quickly, especially if you are an older patient. This reduces the risk of dizzy or fainting spells. Alcohol may increase dizziness and drowsiness.  Avoid alcoholic drinks. What side effects may I notice from receiving this medicine? Side effects that you should report to your doctor or health care professional as soon as possible: -allergic reactions like skin rash, itching or hives, swelling of the face, lips, or tongue) -anxiety -breathing problems -changes in vision -chest pain or chest tightness -depressed mood or other mood changes -hallucinations, loss of contact with reality -fast, irregular heartbeat -increased blood pressure -irritable -nervousness or restlessness -painful urination -palpitations -tremors -trouble sleeping -seizures -signs and symptoms of a stroke like changes in vision; confusion; trouble speaking or understanding; severe headaches; sudden numbness or weakness of the face, arm or leg; trouble walking; dizziness; loss of balance or coordination -unusually weak or tired -vomiting Side effects that usually do not require medical attention (report to your doctor or health care professional if they continue or are bothersome): -constipation or diarrhea -dry mouth -headache -nausea -stomach upset -sweating This list may not describe all possible side effects. Call your doctor for medical advice about side effects. You may report side effects to FDA at 1-800-FDA-1088. Where should I keep my medicine? Keep out of the reach of children. This medicine can be abused. Keep your medicine in a safe place to protect it from theft. Do not share this medicine with anyone. Selling or giving away this medicine is dangerous and against the law. This medicine may cause accidental overdose and death if taken by other adults, children, or pets. Mix any unused medicine with a substance like cat litter or coffee grounds. Then throw the medicine away in a sealed container like a sealed bag or a coffee can with a lid. Do not use the medicine after the expiration date. Store at room temperature between 20 and 25 degrees C (68 and  77 degrees F). Keep container tightly closed. NOTE: This sheet is a summary. It may not cover all possible information. If you have questions about this medicine, talk to your doctor, pharmacist, or health care provider.  2019 Elsevier/Gold Standard (2017-02-20 08:23:13)  

## 2018-11-29 NOTE — Progress Notes (Signed)
[facility] [address] [place], [place] [address]  Internal MEDICINE  Office Visit Note  Patient Name: Natasha Moreno  [id number]  [id number]  Date of Service: 11/29/2018  Chief Complaint  Patient presents with  . Medical Management of Chronic Issues    weight loss management     HPI  Pt is here for follow up on weight management. Patient is using phentermine for weight loss.  Since our last visit they have lost 1 pounds.  The patient denies any chest pain, palpitations, shortness of breath, constipation, headaches or any other side effects of the medication.  Patient wishes to continue to use this medication for weight loss at this time.   Current Medication: Outpatient Encounter Medications as of 11/29/2018  Medication Sig  . aspirin EC 325 MG tablet Take 1 tablet (325 mg total) by mouth daily.  Marland Kitchen atorvastatin (LIPITOR) 10 MG tablet Take 1 tablet (10 mg total) by mouth daily.  . cetirizine (ZYRTEC ALLERGY) 10 MG tablet Take 10 mg by mouth daily as needed for allergies.   . phentermine 37.5 MG capsule Take 1 capsule (37.5 mg total) by mouth every morning.  . [DISCONTINUED] phentermine 37.5 MG capsule Take 1 capsule (37.5 mg total) by mouth every morning.   Facility-Administered Encounter Medications as of 11/29/2018  Medication  . cyanocobalamin ((VITAMIN B-12)) injection 1,000 mcg    Surgical History: Past Surgical History:  Procedure Laterality Date  . CHOLECYSTECTOMY    . TEE WITHOUT CARDIOVERSION N/A 11/11/2017   Procedure: TRANSESOPHAGEAL ECHOCARDIOGRAM (TEE);  Surgeon: Dalia Heading, MD;  Location: [facility] ORS;  Service: Cardiovascular;  Laterality: N/A;  . TUBAL LIGATION    . WISDOM TOOTH EXTRACTION      Medical History: Past Medical History:  Diagnosis Date  . Hemoglobin E-A disorder (HCC)   . Hyperlipidemia   . Migraine   . Morbid obesity (HCC)   . Stroke [facility])     Family History: Family History  Problem Relation Age of Onset  . Kidney  failure Father   . Heart attack Father        open heart surgery  . Hypertension Father   . Diabetes Father   . Gout Father   . Cataracts Father     Social History   Socioeconomic History  . Marital status: Single    Spouse name: Not on file  . Number of children: 2  . Years of education: Not on file  . Highest education level: High school graduate  Occupational History  . Not on file  Social Needs  . Financial resource strain: Not hard at all  . Food insecurity:    Worry: Never true    Inability: Never true  . Transportation needs:    Medical: No    Non-medical: No  Tobacco Use  . Smoking status: Former Smoker    Packs/day: 0.25    Types: Cigarettes    Last attempt to quit: 11/09/2017    Years since quitting: 1.0  . Smokeless tobacco: Never Used  . Tobacco comment: approximately 4 cigarettes a day  Substance and Sexual Activity  . Alcohol use: Yes    Frequency: Never    Comment: every other weekend, not much   . Drug use: No  . Sexual activity: Yes    Birth control/protection: Condom  Lifestyle  . Physical activity:    Days per week: 3 days    Minutes per session: 50 min  . Stress: Not at all  Relationships  .  Social connections:    Talks on phone: More than three times a week    Gets together: Once a week    Attends religious service: Never    Active member of club or organization: No    Attends meetings of clubs or organizations: Never    Relationship status: Not on file  . Intimate partner violence:    Fear of current or ex partner: Not on file    Emotionally abused: Not on file    Physically abused: Not on file    Forced sexual activity: Not on file  Other Topics Concern  . Not on file  Social History Narrative   Lives at home with her parents, brother and her children   Right handed   Drinks rare caffeine       Review of Systems  Constitutional: Negative for chills, fatigue and unexpected weight change.  HENT: Negative for congestion,  rhinorrhea, sneezing and sore throat.   Eyes: Negative for photophobia, pain and redness.  Respiratory: Negative for cough, chest tightness and shortness of breath.   Cardiovascular: Negative for chest pain and palpitations.  Gastrointestinal: Negative for abdominal pain, constipation, diarrhea, nausea and vomiting.  Endocrine: Negative.   Genitourinary: Negative for dysuria and frequency.  Musculoskeletal: Negative for arthralgias, back pain, joint swelling and neck pain.  Skin: Negative for rash.  Allergic/Immunologic: Negative.   Neurological: Negative for tremors and numbness.  Hematological: Negative for adenopathy. Does not bruise/bleed easily.  Psychiatric/Behavioral: Negative for behavioral problems and sleep disturbance. The patient is not nervous/anxious.     Vital Signs: BP 128/84   Pulse 82   Resp 16   Ht 5' (1.524 m)   Wt 213 lb (96.6 kg)   SpO2 98%   BMI 41.60 kg/m    Physical Exam Vitals signs and nursing note reviewed.  Constitutional:      General: She is not in acute distress.    Appearance: She is well-developed. She is not diaphoretic.  HENT:     Head: Normocephalic and atraumatic.     Mouth/Throat:     Pharynx: No oropharyngeal exudate.  Eyes:     Pupils: Pupils are equal, round, and reactive to light.  Neck:     Musculoskeletal: Normal range of motion and neck supple.     Thyroid: No thyromegaly.     Vascular: No JVD.     Trachea: No tracheal deviation.  Cardiovascular:     Rate and Rhythm: Normal rate and regular rhythm.     Heart sounds: Normal heart sounds. No murmur. No friction rub. No gallop.   Pulmonary:     Effort: Pulmonary effort is normal. No respiratory distress.     Breath sounds: Normal breath sounds. No wheezing or rales.  Chest:     Chest wall: No tenderness.  Abdominal:     Palpations: Abdomen is soft.     Tenderness: There is no abdominal tenderness. There is no guarding.  Musculoskeletal: Normal range of motion.   Lymphadenopathy:     Cervical: No cervical adenopathy.  Skin:    General: Skin is warm and dry.  Neurological:     Mental Status: She is alert and oriented to person, place, and time.     Cranial Nerves: No cranial nerve deficit.  Psychiatric:        Behavior: Behavior normal.        Thought Content: Thought content normal.        Judgment: Judgment normal.    Assessment/Plan: 1.  Fatigue, unspecified type Pt reports fatigue is resolving, and she feels more energized.  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   2. Class 3 severe obesity due to excess calories with serious comorbidity and body mass index (BMI) of 40.0 to 44.9 in adult [facility]) We discussed that if she goes another month without losing more weight, we would need to stop her phentermine and reevaluate her weight loss goals/commitiment.  Obesity Counseling: Risk Assessment: An assessment of behavioral risk factors was made today and includes lack of exercise sedentary lifestyle, lack of portion control and poor dietary habits.  Risk Modification Advice: She was counseled on portion control guidelines. Restricting daily caloric intake to. . The detrimental long term effects of obesity on her health and ongoing poor compliance was also discussed with the patient.  There is a liability release in patients' chart. There has been a 10 minute discussion about the side effects including but not limited to elevated blood pressure, anxiety, lack of sleep and dry mouth. Pt understands and will like to start/continue on appetite suppressant at this time. There will be one month RX given at the time of visit with proper follow up. Nova diet plan with restricted calories is given to the pt. Pt understands and agrees with  plan of treatment - phentermine 37.5 MG capsule; Take 1 capsule (37.5 mg total) by mouth every morning.  Dispense: 30 capsule; Refill: 0  3. Hyperlipidemia, unspecified  hyperlipidemia type Stable, continue to follow  General Counseling: Lauryl verbalizes understanding of the findings of todays visit and agrees with plan of treatment. I have discussed any further diagnostic evaluation that may be needed or ordered today. We also reviewed her medications today. she has been encouraged to call the office with any questions or concerns that should arise related to todays visit.    No orders of the defined types were placed in this encounter.   Meds ordered this encounter  Medications  . phentermine 37.5 MG capsule    Sig: Take 1 capsule (37.5 mg total) by mouth every morning.    Dispense:  30 capsule    Refill:  0    Time spent: 25 Minutes   This patient was seen by Blima Ledger AGNP-C in Collaboration with Dr Lyndon Code as a part of collaborative care agreement     Johnna Acosta AGNP-C Internal medicine

## 2018-12-02 ENCOUNTER — Encounter: Payer: Self-pay | Admitting: Adult Health

## 2018-12-02 ENCOUNTER — Ambulatory Visit: Payer: BLUE CROSS/BLUE SHIELD | Admitting: Adult Health

## 2018-12-02 ENCOUNTER — Other Ambulatory Visit: Payer: Self-pay

## 2018-12-02 VITALS — BP 128/86 | HR 86 | Temp 98.5°F | Resp 16 | Ht 60.0 in | Wt 212.0 lb

## 2018-12-02 DIAGNOSIS — T7840XD Allergy, unspecified, subsequent encounter: Secondary | ICD-10-CM

## 2018-12-02 DIAGNOSIS — R0982 Postnasal drip: Secondary | ICD-10-CM

## 2018-12-02 NOTE — Progress Notes (Signed)
[facility] [address] [place], [place] [address]  Internal MEDICINE  Office Visit Note  Patient Name: Natasha Moreno  [id number]  [id number]  Date of Service: 12/02/2018  Chief Complaint  Patient presents with  . Cough    chest congestion     HPI Pt is here for a sick visit.  She reports one day of cough and congestion.  She reports her coworkers have been sick with bronchitis and pneumonia so she is concerned that she has that.  She reports a minorly productive cough with clear mucus.  She says she can hear herself wheezing overnight last night.     Current Medication:  Outpatient Encounter Medications as of 12/02/2018  Medication Sig  . aspirin EC 325 MG tablet Take 1 tablet (325 mg total) by mouth daily.  Marland Kitchen atorvastatin (LIPITOR) 10 MG tablet Take 1 tablet (10 mg total) by mouth daily.  . cetirizine (ZYRTEC ALLERGY) 10 MG tablet Take 10 mg by mouth daily as needed for allergies.   . phentermine 37.5 MG capsule Take 1 capsule (37.5 mg total) by mouth every morning.   Facility-Administered Encounter Medications as of 12/02/2018  Medication  . cyanocobalamin ((VITAMIN B-12)) injection 1,000 mcg      Medical History: Past Medical History:  Diagnosis Date  . Hemoglobin E-A disorder (HCC)   . Hyperlipidemia   . Migraine   . Morbid obesity (HCC)   . Stroke [facility])      Vital Signs: BP 128/86   Pulse 86   Temp 98.5 F (36.9 C)   Resp 16   Ht 5' (1.524 m)   Wt 212 lb (96.2 kg)   SpO2 98%   BMI 41.40 kg/m    Review of Systems  Constitutional: Negative for chills, fatigue and unexpected weight change.  HENT: Positive for postnasal drip, rhinorrhea and sinus pressure. Negative for congestion, sneezing and sore throat.   Eyes: Negative for photophobia, pain and redness.  Respiratory: Negative for cough, chest tightness and shortness of breath.   Cardiovascular: Negative for chest pain and palpitations.  Gastrointestinal: Negative for abdominal  pain, constipation, diarrhea, nausea and vomiting.  Endocrine: Negative.   Genitourinary: Negative for dysuria and frequency.  Musculoskeletal: Negative for arthralgias, back pain, joint swelling and neck pain.  Skin: Negative for rash.  Allergic/Immunologic: Negative.   Neurological: Negative for tremors and numbness.  Hematological: Negative for adenopathy. Does not bruise/bleed easily.  Psychiatric/Behavioral: Negative for behavioral problems and sleep disturbance. The patient is not nervous/anxious.     Physical Exam Vitals signs and nursing note reviewed.  Constitutional:      General: She is not in acute distress.    Appearance: She is well-developed. She is not diaphoretic.  HENT:     Head: Normocephalic and atraumatic.     Nose:     Comments: Pale turbinates.    Mouth/Throat:     Pharynx: No oropharyngeal exudate.  Eyes:     Pupils: Pupils are equal, round, and reactive to light.  Neck:     Musculoskeletal: Normal range of motion and neck supple.     Thyroid: No thyromegaly.     Vascular: No JVD.     Trachea: No tracheal deviation.  Cardiovascular:     Rate and Rhythm: Normal rate and regular rhythm.     Heart sounds: Normal heart sounds. No murmur. No friction rub. No gallop.   Pulmonary:     Effort: Pulmonary effort is normal. No respiratory distress.  Breath sounds: Normal breath sounds. No wheezing or rales.  Chest:     Chest wall: No tenderness.  Abdominal:     Palpations: Abdomen is soft.     Tenderness: There is no abdominal tenderness. There is no guarding.  Musculoskeletal: Normal range of motion.  Lymphadenopathy:     Cervical: No cervical adenopathy.  Skin:    General: Skin is warm and dry.  Neurological:     Mental Status: She is alert and oriented to person, place, and time.     Cranial Nerves: No cranial nerve deficit.  Psychiatric:        Behavior: Behavior normal.        Thought Content: Thought content normal.        Judgment: Judgment  normal.    Assessment/Plan: 1. Allergic state, subsequent encounter Encourage patient to continue to use OTC medications around-the-clock for the next 3 days.  If she develops a fever or has no improvement in symptoms she may call or return to clinic.  If she gets a fever would treat her for sinus infection however at this time I feel like it is mostly allergies.  2. PND (post-nasal drip) Postnasal drip due to allergies.  Continue to take OTC medications around-the-clock.  General Counseling: jimia benston understanding of the findings of todays visit and agrees with plan of treatment. I have discussed any further diagnostic evaluation that may be needed or ordered today. We also reviewed her medications today. she has been encouraged to call the office with any questions or concerns that should arise related to todays visit.   No orders of the defined types were placed in this encounter.   No orders of the defined types were placed in this encounter.   Time spent:25  Minutes   This patient was seen by Blima Ledger AGNP-C in Collaboration with Dr Lyndon Code as a part of collaborative care agreement.  Johnna Acosta AGNP-C Internal Medicine

## 2018-12-29 ENCOUNTER — Encounter: Payer: Self-pay | Admitting: Nurse Practitioner

## 2018-12-29 ENCOUNTER — Other Ambulatory Visit: Payer: Self-pay

## 2018-12-29 ENCOUNTER — Ambulatory Visit (INDEPENDENT_AMBULATORY_CARE_PROVIDER_SITE_OTHER): Payer: BLUE CROSS/BLUE SHIELD | Admitting: Internal Medicine

## 2018-12-29 VITALS — BP 112/76 | HR 85 | Resp 16 | Ht 60.0 in | Wt 215.0 lb

## 2018-12-29 DIAGNOSIS — E785 Hyperlipidemia, unspecified: Secondary | ICD-10-CM | POA: Diagnosis not present

## 2018-12-29 DIAGNOSIS — Z6841 Body Mass Index (BMI) 40.0 and over, adult: Secondary | ICD-10-CM

## 2018-12-29 NOTE — Progress Notes (Signed)
[facility] [address] [place], [place] [address]  Internal MEDICINE  Office Visit Note  Patient Name: Natasha Moreno  [id number]  [id number]  Date of Service: 01/03/2019  Chief Complaint  Patient presents with  . Medical Management of Chronic Issues    weight loss management 4 week follow up    HPI Pt is here for follow up, history of TIA, no residual deficit, on lipitor, she is trying to lose weight, she has been also on app suppressant but losing weight  Pt has history of  weakness and numbness of her left side, however it has improved. 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 Pt was admitted in [facility] for few days, did extensive work up with no etiology at this time.  Current Medication: Outpatient Encounter Medications as of 12/29/2018  Medication Sig  . aspirin EC 325 MG tablet Take 1 tablet (325 mg total) by mouth daily.  Marland Kitchen atorvastatin (LIPITOR) 10 MG tablet Take 1 tablet (10 mg total) by mouth daily.  . cetirizine (ZYRTEC ALLERGY) 10 MG tablet Take 10 mg by mouth daily as needed for allergies.   . phentermine 37.5 MG capsule Take 1 capsule (37.5 mg total) by mouth every morning.   Facility-Administered Encounter Medications as of 12/29/2018  Medication  . cyanocobalamin ((VITAMIN B-12)) injection 1,000 mcg    Surgical History: Past Surgical History:  Procedure Laterality Date  . CHOLECYSTECTOMY    . TEE WITHOUT CARDIOVERSION N/A 11/11/2017   Procedure: TRANSESOPHAGEAL ECHOCARDIOGRAM (TEE);  Surgeon: Dalia Heading, MD;  Location: [facility] ORS;  Service: Cardiovascular;  Laterality: N/A;  . TUBAL LIGATION    . WISDOM TOOTH EXTRACTION      Medical History: Past Medical History:  Diagnosis Date  . Hemoglobin E-A disorder (HCC)   . Hyperlipidemia   . Migraine   . Morbid obesity (HCC)   . Stroke [facility])     Family History: Family History  Problem Relation Age of Onset  . Kidney  failure Father   . Heart attack Father        open heart surgery  . Hypertension Father   . Diabetes Father   . Gout Father   . Cataracts Father     Social History   Socioeconomic History  . Marital status: Single    Spouse name: Not on file  . Number of children: 2  . Years of education: Not on file  . Highest education level: High school graduate  Occupational History  . Not on file  Social Needs  . Financial resource strain: Not hard at all  . Food insecurity:    Worry: Never true    Inability: Never true  . Transportation needs:    Medical: No    Non-medical: No  Tobacco Use  . Smoking status: Former Smoker    Packs/day: 0.25    Types: Cigarettes    Last attempt to quit: 11/09/2017    Years since quitting: 1.1  . Smokeless tobacco: Never Used  . Tobacco comment: approximately 4 cigarettes a day  Substance and Sexual Activity  . Alcohol use: Yes    Frequency: Never    Comment: every other weekend, not much   . Drug use: No  . Sexual activity: Yes    Birth control/protection: Condom  Lifestyle  . Physical activity:    Days per week: 3 days    Minutes per session: 50 min  .  Stress: Not at all  Relationships  . Social connections:    Talks on phone: More than three times a week    Gets together: Once a week    Attends religious service: Never    Active member of club or organization: No    Attends meetings of clubs or organizations: Never    Relationship status: Not on file  . Intimate partner violence:    Fear of current or ex partner: Not on file    Emotionally abused: Not on file    Physically abused: Not on file    Forced sexual activity: Not on file  Other Topics Concern  . Not on file  Social History Narrative   Lives at home with her parents, brother and her children   Right handed   Drinks rare caffeine       Review of Systems  Constitutional: Negative for chills, diaphoresis and fatigue.  HENT: Negative for ear pain, postnasal drip and  sinus pressure.   Eyes: Negative for photophobia, discharge, redness, itching and visual disturbance.  Respiratory: Negative for cough, shortness of breath and wheezing.   Cardiovascular: Negative for chest pain, palpitations and leg swelling.  Gastrointestinal: Negative for abdominal pain, constipation, diarrhea, nausea and vomiting.  Genitourinary: Negative for dysuria and flank pain.  Musculoskeletal: Negative for arthralgias, back pain, gait problem and neck pain.  Skin: Negative for color change.  Allergic/Immunologic: Negative for environmental allergies and food allergies.  Neurological: Negative for dizziness and headaches.  Hematological: Does not bruise/bleed easily.  Psychiatric/Behavioral: Negative for agitation, behavioral problems (depression) and hallucinations.   Vital Signs: BP 112/76 (BP Location: Left Arm, Patient Position: Sitting, Cuff Size: Normal)   Pulse 85   Resp 16   Ht 5' (1.524 m)   Wt 215 lb (97.5 kg)   SpO2 97%   BMI 41.99 kg/m   Physical Exam Vitals signs reviewed.  HENT:     Head: Normocephalic.  Cardiovascular:     Rate and Rhythm: Normal rate.  Pulmonary:     Effort: Pulmonary effort is normal.     Breath sounds: Normal breath sounds.  Neurological:     Mental Status: She is alert.    Assessment/Plan: 1. Class 3 severe obesity due to excess calories with serious comorbidity and body mass index (BMI) of 40.0 to 44.9 in adult [facility](HCC) Pt was given a diet plan to follow With restrictions of carbs, she was instructed to eat about 50 grams of protiens with countless vegatbles for a week, she will call next week with her weight. No app suppressants is given today   2. Hyperlipidemia, unspecified hyperlipidemia type - Continue Lipitor   General Counseling: Natasha Moreno verbalizes understanding of the findings of todays visit and agrees with plan of treatment. I have discussed any further diagnostic evaluation that may be needed or ordered today. We also  reviewed her medications today. she has been encouraged to call the office with any questions or concerns that should arise related to todays visit.  Obesity Counseling: Risk Assessment: An assessment of behavioral risk factors was made today and includes lack of exercise sedentary lifestyle, lack of portion control and poor dietary habits.  Risk Modification Advice: She was counseled on portion control guidelines. Restricting daily caloric intake to. . The detrimental long term effects of obesity on her health and ongoing poor compliance was also discussed with the patient.     Time spent:25 Minutes      Dr Lyndon CodeFozia M Sanaia Jasso Internal medicine

## 2018-12-30 ENCOUNTER — Encounter: Payer: Self-pay | Admitting: Adult Health

## 2019-01-06 ENCOUNTER — Telehealth: Payer: Self-pay

## 2019-01-06 NOTE — Telephone Encounter (Signed)
Pt called that she loss 9 lbs as per dr Welton Flakes one day break and she can gave her tomorow next diet chart

## 2019-01-10 ENCOUNTER — Ambulatory Visit: Payer: BLUE CROSS/BLUE SHIELD | Admitting: Adult Health

## 2019-01-10 ENCOUNTER — Telehealth: Payer: Self-pay

## 2019-01-10 NOTE — Telephone Encounter (Signed)
-----   Message from Lyndon Code, MD sent at 01/07/2019  9:55 AM EDT ----- Advise patient she can add some cooked vegetables and baked chicken in her previous diet for next 2 weeks, no sugars, rice or pasta for next 2  week, she will have a chest day next saturday, she can have favorite food  ----- Message ----- From: Golda Acre Sent: 01/07/2019   9:18 AM EDT To: Lyndon Code, MD  This week diet plan

## 2019-01-10 NOTE — Telephone Encounter (Signed)
Pt advised for diet she can add some cooked vegetables and baked chicken in her previous diet for next 2 weeks,no sugars ,rice,pasta for next 2 weeks ,Saturday you can have favorite food

## 2019-01-18 DIAGNOSIS — Z8673 Personal history of transient ischemic attack (TIA), and cerebral infarction without residual deficits: Secondary | ICD-10-CM | POA: Diagnosis not present

## 2019-01-26 ENCOUNTER — Ambulatory Visit: Payer: BLUE CROSS/BLUE SHIELD | Admitting: Nurse Practitioner

## 2019-02-07 ENCOUNTER — Ambulatory Visit: Payer: BLUE CROSS/BLUE SHIELD | Admitting: Obstetrics and Gynecology

## 2019-02-25 ENCOUNTER — Other Ambulatory Visit: Payer: Self-pay | Admitting: Internal Medicine

## 2019-08-05 ENCOUNTER — Telehealth: Payer: Self-pay

## 2019-08-05 NOTE — Telephone Encounter (Signed)
CONFIRMED AND SCREENED FOR 08-09-19 OV. °

## 2019-08-09 ENCOUNTER — Other Ambulatory Visit: Payer: Self-pay

## 2019-08-09 ENCOUNTER — Encounter: Payer: Self-pay | Admitting: Internal Medicine

## 2019-08-09 ENCOUNTER — Ambulatory Visit: Payer: BLUE CROSS/BLUE SHIELD | Admitting: Internal Medicine

## 2019-08-09 VITALS — BP 126/89 | HR 84 | Temp 97.5°F | Resp 16 | Ht 60.0 in | Wt 230.6 lb

## 2019-08-09 DIAGNOSIS — Z6841 Body Mass Index (BMI) 40.0 and over, adult: Secondary | ICD-10-CM | POA: Diagnosis not present

## 2019-08-09 DIAGNOSIS — I639 Cerebral infarction, unspecified: Secondary | ICD-10-CM | POA: Diagnosis not present

## 2019-08-09 DIAGNOSIS — I10 Essential (primary) hypertension: Secondary | ICD-10-CM | POA: Diagnosis not present

## 2019-08-09 MED ORDER — BISOPROLOL-HYDROCHLOROTHIAZIDE 2.5-6.25 MG PO TABS: ORAL_TABLET | ORAL | 2 refills | Status: DC

## 2019-08-09 NOTE — Progress Notes (Signed)
[facility] [address] [place], [place] [address]  Internal MEDICINE  Office Visit Note  Patient Name: Natasha BoschDorothy Moreno  [id number]  [id number]  Date of Service: 08/11/2019  Chief Complaint  Patient presents with  . Hypertension    headaches, dizziness, went to walk-in clinic and pt top number on bp was 150   Hypertension This is a recurrent problem. The current episode started in the past 7 days. The problem is unchanged. The problem is uncontrolled. Pertinent negatives include no chest pain, headaches, neck pain, palpitations or shortness of breath. (Pt was seen for possible COVID exposure. She was tested negative but found out she had very high BP ) Risk factors for coronary artery disease include sedentary lifestyle and obesity (She has H/O CVA ). There are no compliance problems.    Current Medication: Outpatient Encounter Medications as of 08/09/2019  Medication Sig  . aspirin EC 325 MG tablet Take 1 tablet (325 mg total) by mouth daily.  Marland Kitchen. atorvastatin (LIPITOR) 10 MG tablet TAKE 1 TABLET BY MOUTH EVERY DAY  . cetirizine (ZYRTEC ALLERGY) 10 MG tablet Take 10 mg by mouth daily as needed for allergies.   . bisoprolol-hydrochlorothiazide (ZIAC) 2.5-6.25 MG tablet Take one tab for BP in am  . [DISCONTINUED] phentermine 37.5 MG capsule Take 1 capsule (37.5 mg total) by mouth every morning. (Patient not taking: Reported on 08/09/2019)   Facility-Administered Encounter Medications as of 08/09/2019  Medication  . cyanocobalamin ((VITAMIN B-12)) injection 1,000 mcg    Surgical History: Past Surgical History:  Procedure Laterality Date  . CHOLECYSTECTOMY    . TEE WITHOUT CARDIOVERSION N/A 11/11/2017   Procedure: TRANSESOPHAGEAL ECHOCARDIOGRAM (TEE);  Surgeon: Dalia HeadingFath, Kenneth A, MD;  Location: [facility] ORS;  Service: Cardiovascular;  Laterality: N/A;  . TUBAL LIGATION    . WISDOM TOOTH EXTRACTION      Medical History: Past Medical History:  Diagnosis Date  . Hemoglobin  E-A disorder (HCC)   . Hyperlipidemia   . Migraine   . Morbid obesity (HCC)   . Stroke [facility](HCC)     Family History: Family History  Problem Relation Age of Onset  . Kidney failure Father   . Heart attack Father        open heart surgery  . Hypertension Father   . Diabetes Father   . Gout Father   . Cataracts Father     Social History   Socioeconomic History  . Marital status: Single    Spouse name: Not on file  . Number of children: 2  . Years of education: Not on file  . Highest education level: High school graduate  Occupational History  . Not on file  Social Needs  . Financial resource strain: Not hard at all  . Food insecurity    Worry: Never true    Inability: Never true  . Transportation needs    Medical: No    Non-medical: No  Tobacco Use  . Smoking status: Former Smoker    Packs/day: 0.25    Types: Cigarettes    Quit date: 11/09/2017    Years since quitting: 1.7  . Smokeless tobacco: Never Used  . Tobacco comment: approximately 4 cigarettes a day  Substance and Sexual Activity  . Alcohol use: Yes    Frequency: Never    Comment: every other weekend, not much   . Drug use: No  . Sexual activity: Yes    Birth control/protection: Condom  Lifestyle  . Physical activity    Days per  week: 3 days    Minutes per session: 50 min  . Stress: Not at all  Relationships  . Social connections    Talks on phone: More than three times a week    Gets together: Once a week    Attends religious service: Never    Active member of club or organization: No    Attends meetings of clubs or organizations: Never    Relationship status: Not on file  . Intimate partner violence    Fear of current or ex partner: Not on file    Emotionally abused: Not on file    Physically abused: Not on file    Forced sexual activity: Not on file  Other Topics Concern  . Not on file  Social History Narrative   Lives at home with her parents, brother and her children   Right handed    Drinks rare caffeine    Review of Systems  Constitutional: Negative for chills, diaphoresis and fatigue.  HENT: Negative for ear pain, postnasal drip and sinus pressure.   Eyes: Negative for photophobia, discharge, redness, itching and visual disturbance.  Respiratory: Negative for cough, shortness of breath and wheezing.   Cardiovascular: Negative for chest pain, palpitations and leg swelling.  Gastrointestinal: Negative for abdominal pain, constipation, diarrhea, nausea and vomiting.  Genitourinary: Negative for dysuria and flank pain.  Musculoskeletal: Negative for arthralgias, back pain, gait problem and neck pain.  Skin: Negative for color change.  Allergic/Immunologic: Negative for environmental allergies and food allergies.  Neurological: Negative for dizziness and headaches.  Hematological: Does not bruise/bleed easily.  Psychiatric/Behavioral: Negative for agitation, behavioral problems (depression) and hallucinations.    Vital Signs: BP 126/89   Pulse 84   Temp (!) 97.5 F (36.4 C)   Resp 16   Ht 5' (1.524 m)   Wt 230 lb 9.6 oz (104.6 kg)   SpO2 97%   BMI 45.04 kg/m    Physical Exam Constitutional:      Appearance: Normal appearance. She is obese.  Cardiovascular:     Rate and Rhythm: Normal rate and regular rhythm.     Pulses: Normal pulses.     Heart sounds: Normal heart sounds.  Pulmonary:     Effort: Pulmonary effort is normal.     Breath sounds: Normal breath sounds.  Musculoskeletal: Normal range of motion.  Skin:    General: Skin is warm and dry.  Neurological:     General: No focal deficit present.     Mental Status: She is alert.    Assessment/Plan: 1. Essential hypertension, benign - bisoprolol-hydrochlorothiazide (ZIAC) 2.5-6.25 MG tablet; Take one tab for BP in am  Dispense: 30 tablet; Refill: 2 - Continue to monitor at home.  2. Class 3 severe obesity due to excess calories with serious comorbidity and body mass index (BMI) of 40.0 to 44.9  in adult [facility]) - Pt will start a low calorie diet along with 30 min brisk walk   3. Cerebrovascular accident (CVA), unspecified mechanism (HCC) - Resolved with no residual effects  General Counseling: Priyal verbalizes understanding of the findings of todays visit and agrees with plan of treatment. I have discussed any further diagnostic evaluation that may be needed or ordered today. We also reviewed her medications today. she has been encouraged to call the office with any questions or concerns that should arise related to todays visit. Obesity Counseling: Risk Assessment: An assessment of behavioral risk factors was made today and includes lack of exercise sedentary lifestyle, lack  of portion control and poor dietary habits.  Risk Modification Advice: She was counseled on portion control guidelines. Restricting daily caloric intake to. . The detrimental long term effects of obesity on her health and ongoing poor compliance was also discussed with the patient.   Meds ordered this encounter  Medications  . bisoprolol-hydrochlorothiazide (ZIAC) 2.5-6.25 MG tablet    Sig: Take one tab for BP in am    Dispense:  30 tablet    Refill:  2    Time spent: 25 Minutes   Dr Lavera Guise Internal medicine

## 2019-10-06 ENCOUNTER — Telehealth: Payer: Self-pay

## 2019-10-06 NOTE — Telephone Encounter (Signed)
Confirmed virtual appointment with patient. klh 

## 2019-10-10 ENCOUNTER — Other Ambulatory Visit: Payer: Self-pay

## 2019-10-10 ENCOUNTER — Ambulatory Visit: Payer: BLUE CROSS/BLUE SHIELD | Admitting: Adult Health

## 2019-10-12 ENCOUNTER — Telehealth: Payer: Self-pay

## 2019-10-12 NOTE — Telephone Encounter (Signed)
BILLED PATIENT MISSED APPT FEE FOR 10/10/2019. 

## 2019-12-08 ENCOUNTER — Other Ambulatory Visit: Payer: Self-pay | Admitting: Internal Medicine

## 2019-12-08 DIAGNOSIS — I10 Essential (primary) hypertension: Secondary | ICD-10-CM

## 2019-12-12 ENCOUNTER — Telehealth: Payer: Self-pay

## 2019-12-12 NOTE — Telephone Encounter (Signed)
Called lmom informing patient need to reschedule 2 month fu,bp. klh

## 2020-05-07 ENCOUNTER — Ambulatory Visit (INDEPENDENT_AMBULATORY_CARE_PROVIDER_SITE_OTHER): Payer: Medicaid Other | Admitting: Adult Health

## 2020-05-07 ENCOUNTER — Encounter: Payer: Self-pay | Admitting: Adult Health

## 2020-05-07 ENCOUNTER — Other Ambulatory Visit: Payer: Self-pay

## 2020-05-07 VITALS — BP 125/75 | HR 97 | Temp 97.4°F | Resp 16 | Ht 60.0 in | Wt 237.8 lb

## 2020-05-07 DIAGNOSIS — Z6841 Body Mass Index (BMI) 40.0 and over, adult: Secondary | ICD-10-CM

## 2020-05-07 DIAGNOSIS — E785 Hyperlipidemia, unspecified: Secondary | ICD-10-CM

## 2020-05-07 DIAGNOSIS — I10 Essential (primary) hypertension: Secondary | ICD-10-CM

## 2020-05-07 MED ORDER — ATORVASTATIN CALCIUM 10 MG PO TABS: 10.0000 mg | ORAL_TABLET | Freq: Every day | ORAL | 3 refills | Status: DC

## 2020-05-07 MED ORDER — BISOPROLOL-HYDROCHLOROTHIAZIDE 2.5-6.25 MG PO TABS: ORAL_TABLET | ORAL | 0 refills | Status: DC

## 2020-05-07 NOTE — Progress Notes (Signed)
[facility] [address] [place], [place] [address]  Internal MEDICINE  Office Visit Note  Patient Name: Natasha Moreno  [id number]  [id number]  Date of Service: 05/07/2020  Chief Complaint  Patient presents with  . Hypertension  . Quality Metric Gaps    Hep C screening , Tdap     HPI Pt is here for a sick visit..  She reports she went to get her annual screening and was told her blood pressure was very high.  She does not recall the numbers.  She does report when she was seen last she was given a RX for bp meds but she lost them, and never really took any.  She Denies Chest pain, palpitations, headache, or blurred vision.     Current Medication:  Outpatient Encounter Medications as of 05/07/2020  Medication Sig  . aspirin EC 325 MG tablet Take 1 tablet (325 mg total) by mouth daily.  Marland Kitchen atorvastatin (LIPITOR) 10 MG tablet Take 1 tablet (10 mg total) by mouth daily.  . cetirizine (ZYRTEC ALLERGY) 10 MG tablet Take 10 mg by mouth daily as needed for allergies.   . [DISCONTINUED] atorvastatin (LIPITOR) 10 MG tablet TAKE 1 TABLET BY MOUTH EVERY DAY  . bisoprolol-hydrochlorothiazide (ZIAC) 2.5-6.25 MG tablet TAKE 1 TABLET BY MOUTH EVERY DAY IN THE MORNING FOR BLOOD PRESSURE  . [DISCONTINUED] bisoprolol-hydrochlorothiazide (ZIAC) 2.5-6.25 MG tablet TAKE 1 TABLET BY MOUTH EVERY DAY IN THE MORNING FOR BLOOD PRESSURE (Patient not taking: Reported on 05/07/2020)   Facility-Administered Encounter Medications as of 05/07/2020  Medication  . cyanocobalamin ((VITAMIN B-12)) injection 1,000 mcg      Medical History: Past Medical History:  Diagnosis Date  . Hemoglobin E-A disorder (HCC)   . Hyperlipidemia   . Migraine   . Morbid obesity (HCC)   . Stroke (HCC)      Vital Signs: BP 125/75   Pulse 97   Temp (!) 97.4 F (36.3 C)   Resp 16   Ht 5' (1.524 m)   Wt 237 lb 12.8 oz (107.9 kg)   SpO2 98%   BMI 46.44 kg/m    Review of Systems  Constitutional: Negative  for chills, fatigue and unexpected weight change.  HENT: Negative for congestion, rhinorrhea, sneezing and sore throat.   Eyes: Negative for photophobia, pain and redness.  Respiratory: Negative for cough, chest tightness and shortness of breath.   Cardiovascular: Negative for chest pain and palpitations.  Gastrointestinal: Negative for abdominal pain, constipation, diarrhea, nausea and vomiting.  Endocrine: Negative.   Genitourinary: Negative for dysuria and frequency.  Musculoskeletal: Negative for arthralgias, back pain, joint swelling and neck pain.  Skin: Negative for rash.  Allergic/Immunologic: Negative.   Neurological: Negative for tremors and numbness.  Hematological: Negative for adenopathy. Does not bruise/bleed easily.  Psychiatric/Behavioral: Negative for behavioral problems and sleep disturbance. The patient is not nervous/anxious.     Physical Exam Vitals and nursing note reviewed.  Constitutional:      General: She is not in acute distress.    Appearance: She is well-developed. She is not diaphoretic.  HENT:     Head: Normocephalic and atraumatic.     Mouth/Throat:     Pharynx: No oropharyngeal exudate.  Eyes:     Pupils: Pupils are equal, round, and reactive to light.  Neck:     Thyroid: No thyromegaly.     Vascular: No JVD.     Trachea: No tracheal deviation.  Cardiovascular:     Rate and Rhythm: Normal rate  and regular rhythm.     Heart sounds: Normal heart sounds. No murmur heard.  No friction rub. No gallop.   Pulmonary:     Effort: Pulmonary effort is normal. No respiratory distress.     Breath sounds: Normal breath sounds. No wheezing or rales.  Chest:     Chest wall: No tenderness.  Abdominal:     Palpations: Abdomen is soft.     Tenderness: There is no abdominal tenderness. There is no guarding.  Musculoskeletal:        General: Normal range of motion.     Cervical back: Normal range of motion and neck supple.  Lymphadenopathy:     Cervical: No  cervical adenopathy.  Skin:    General: Skin is warm and dry.  Neurological:     Mental Status: She is alert and oriented to person, place, and time.     Cranial Nerves: No cranial nerve deficit.  Psychiatric:        Behavior: Behavior normal.        Thought Content: Thought content normal.        Judgment: Judgment normal.    Assessment/Plan: 1. Essential hypertension, benign bp intermittently high encouraged patient to take medication as prescribed.  - bisoprolol-hydrochlorothiazide (ZIAC) 2.5-6.25 MG tablet; TAKE 1 TABLET BY MOUTH EVERY DAY IN THE MORNING FOR BLOOD PRESSURE  Dispense: 90 tablet; Refill: 0  2. Hyperlipidemia, unspecified hyperlipidemia type - atorvastatin (LIPITOR) 10 MG tablet; Take 1 tablet (10 mg total) by mouth daily.  Dispense: 90 tablet; Refill: 3  3. BMI 45.0-49.9, adult (HCC) Obesity Counseling: Risk Assessment: An assessment of behavioral risk factors was made today and includes lack of exercise sedentary lifestyle, lack of portion control and poor dietary habits.  Risk Modification Advice: She was counseled on portion control guidelines. Restricting daily caloric intake to1600. The detrimental long term effects of obesity on her health and ongoing poor compliance was also discussed with the patient.  4. Morbid obesity (HCC) BMI over 40  General Counseling: Anna verbalizes understanding of the findings of todays visit and agrees with plan of treatment. I have discussed any further diagnostic evaluation that may be needed or ordered today. We also reviewed her medications today. she has been encouraged to call the office with any questions or concerns that should arise related to todays visit.   No orders of the defined types were placed in this encounter.   Meds ordered this encounter  Medications  . bisoprolol-hydrochlorothiazide (ZIAC) 2.5-6.25 MG tablet    Sig: TAKE 1 TABLET BY MOUTH EVERY DAY IN THE MORNING FOR BLOOD PRESSURE    Dispense:  90  tablet    Refill:  0  . atorvastatin (LIPITOR) 10 MG tablet    Sig: Take 1 tablet (10 mg total) by mouth daily.    Dispense:  90 tablet    Refill:  3    Time spent: 25 Minutes  This patient was seen by Johnna Acosta  AGNP-C in Collaboration with Dr Lyndon Code as a part of collaborative care agreement.  Johnna Acosta AGNP-C Internal Medicine

## 2020-06-07 ENCOUNTER — Telehealth: Payer: Self-pay

## 2020-06-07 NOTE — Telephone Encounter (Signed)
Confirmed and screened for OV on 9/20 

## 2020-06-11 ENCOUNTER — Encounter: Payer: Medicaid Other | Admitting: Adult Health

## 2020-06-18 ENCOUNTER — Encounter: Payer: Medicaid Other | Admitting: Adult Health

## 2020-07-23 ENCOUNTER — Ambulatory Visit (INDEPENDENT_AMBULATORY_CARE_PROVIDER_SITE_OTHER): Payer: Medicaid Other | Admitting: Nurse Practitioner

## 2020-07-23 ENCOUNTER — Other Ambulatory Visit: Payer: Self-pay

## 2020-07-23 ENCOUNTER — Encounter: Payer: Self-pay | Admitting: Nurse Practitioner

## 2020-07-23 ENCOUNTER — Ambulatory Visit
Admission: RE | Admit: 2020-07-23 | Discharge: 2020-07-23 | Disposition: A | Discharge status: Home / Self Care | Payer: Medicaid Other | Source: Ambulatory Visit | Attending: Nurse Practitioner | Admitting: Nurse Practitioner

## 2020-07-23 ENCOUNTER — Ambulatory Visit
Admission: RE | Admit: 2020-07-23 | Discharge: 2020-07-23 | Disposition: A | Discharge status: Home / Self Care | Payer: Medicaid Other | Attending: Nurse Practitioner | Admitting: Nurse Practitioner

## 2020-07-23 VITALS — BP 132/88 | HR 95 | Temp 97.8°F | Resp 16 | Ht 60.0 in | Wt 227.6 lb

## 2020-07-23 DIAGNOSIS — Z23 Encounter for immunization: Secondary | ICD-10-CM

## 2020-07-23 DIAGNOSIS — M25562 Pain in left knee: Secondary | ICD-10-CM | POA: Diagnosis present

## 2020-07-23 DIAGNOSIS — M7122 Synovial cyst of popliteal space [Baker], left knee: Secondary | ICD-10-CM | POA: Diagnosis not present

## 2020-07-23 MED ORDER — IBUPROFEN 600 MG PO TABS: 600.0000 mg | ORAL_TABLET | Freq: Three times a day (TID) | ORAL | 1 refills | Status: DC | PRN

## 2020-07-23 NOTE — Progress Notes (Signed)
[facility] [address] [place], [place] [address]  Internal MEDICINE  Office Visit Note  Patient Name: Natasha Moreno  [id number]  [id number]  Date of Service: 08/12/2020   Pt is here for a sick visit.   Chief Complaint  Patient presents with  . Acute Visit  . Leg Pain    knot on back of left leg behind knee, hard to walk, started about a week and a halg ago  . Hyperlipidemia  . policy update form    reviewed     The patient is here for acute visit. She states that she has developed a lump behind the left knee. First noted about 1.5 weeks ago. Has gradually become larger and more painful. Now hurting to walk on the leg at all. Denies injury or trauma to the area. States that pain will radiate down into the calf at times, but no other pain in leg, ankle, or foot.  She does have pain along the front and medial aspect of the knee with bending and straightening the left leg. The patient states she would like to get flu shot while she is here today.       Current Medication:  Outpatient Encounter Medications as of 07/23/2020  Medication Sig  . aspirin EC 325 MG tablet Take 1 tablet (325 mg total) by mouth daily.  Marland Kitchen atorvastatin (LIPITOR) 10 MG tablet Take 1 tablet (10 mg total) by mouth daily.  . bisoprolol-hydrochlorothiazide (ZIAC) 2.5-6.25 MG tablet TAKE 1 TABLET BY MOUTH EVERY DAY IN THE MORNING FOR BLOOD PRESSURE  . cetirizine (ZYRTEC ALLERGY) 10 MG tablet Take 10 mg by mouth daily as needed for allergies.   Marland Kitchen ibuprofen (ADVIL) 600 MG tablet Take 1 tablet (600 mg total) by mouth every 8 (eight) hours as needed.   Facility-Administered Encounter Medications as of 07/23/2020  Medication  . cyanocobalamin ((VITAMIN B-12)) injection 1,000 mcg      Medical History: Past Medical History:  Diagnosis Date  . Hemoglobin E-A disorder (HCC)   . Hyperlipidemia   . Hypertension   . Migraine   . Morbid obesity (HCC)   . Stroke (HCC)      Today's Vitals    07/23/20 1427  BP: 132/88  Pulse: 95  Resp: 16  Temp: 97.8 F (36.6 C)  SpO2: 98%  Weight: 227 lb 9.6 oz (103.2 kg)  Height: 5' (1.524 m)   Body mass index is 44.45 kg/m.  Review of Systems  Constitutional: Negative for activity change, chills, fatigue and unexpected weight change.  HENT: Negative for congestion, postnasal drip, rhinorrhea, sneezing and sore throat.   Respiratory: Negative for cough, chest tightness and shortness of breath.   Cardiovascular: Negative for chest pain and palpitations.  Gastrointestinal: Negative for abdominal pain, constipation, diarrhea, nausea and vomiting.  Genitourinary: Negative for dysuria and frequency.  Musculoskeletal: Positive for myalgias. Negative for arthralgias, back pain, joint swelling and neck pain.       Pain with lump present behind the left knee. Hurts more when standing on her feet for long periods of time.   Skin: Negative for rash.  Neurological: Negative for dizziness, tremors, numbness and headaches.  Hematological: Negative for adenopathy. Does not bruise/bleed easily.  Psychiatric/Behavioral: Negative for behavioral problems (Depression), sleep disturbance and suicidal ideas. The patient is not nervous/anxious.     Physical Exam Vitals and nursing note reviewed.  Constitutional:      General: She is not in acute distress.    Appearance: Normal appearance. She  is well-developed. She is not diaphoretic.  HENT:     Head: Normocephalic and atraumatic.     Mouth/Throat:     Pharynx: No oropharyngeal exudate.  Eyes:     Pupils: Pupils are equal, round, and reactive to light.  Neck:     Thyroid: No thyromegaly.     Vascular: No JVD.     Trachea: No tracheal deviation.  Cardiovascular:     Rate and Rhythm: Normal rate and regular rhythm.     Heart sounds: Normal heart sounds. No murmur heard.  No friction rub. No gallop.   Pulmonary:     Effort: Pulmonary effort is normal. No respiratory distress.     Breath sounds:  Normal breath sounds. No wheezing or rales.  Chest:     Chest wall: No tenderness.  Abdominal:     Palpations: Abdomen is soft.  Musculoskeletal:        General: Normal range of motion.     Cervical back: Normal range of motion and neck supple.     Comments: There is tenderness behind the left knee with small, palpable lump along the medial aspect of the posterior left knee.   Lymphadenopathy:     Cervical: No cervical adenopathy.  Skin:    General: Skin is warm and dry.  Neurological:     Mental Status: She is alert and oriented to person, place, and time.     Cranial Nerves: No cranial nerve deficit.  Psychiatric:        Mood and Affect: Mood normal.        Behavior: Behavior normal.        Thought Content: Thought content normal.        Judgment: Judgment normal.    Assessment/Plan: 1. Acute pain of left knee Will get ibuprofen 600mg  up to three times daily as needed for pain/inflammation. Rest and elevate the leg when possible. Will get x-ray of the left knee for further evaluation.  - DG Knee 1-2 Views Left; Future - ibuprofen (ADVIL) 600 MG tablet; Take 1 tablet (600 mg total) by mouth every 8 (eight) hours as needed.  Dispense: 45 tablet; Refill: 1  2. Baker cyst, left Will get venous ultrasound of left lower extremity for further evaluation.  - Venous Img Lower Unilateral Left; Future  3. Flu vaccine need Flu vaccine administered in the office today - Flu Vaccine MDCK QUAD PF  General Counseling: Belky verbalizes understanding of the findings of todays visit and agrees with plan of treatment. I have discussed any further diagnostic evaluation that may be needed or ordered today. We also reviewed her medications today. she has been encouraged to call the office with any questions or concerns that should arise related to todays visit.    Counseling:  This patient was seen by [place] FNP Collaboration with Dr Vincent Gros as a part of collaborative care  agreement  Orders Placed This Encounter  Procedures  . DG Knee 1-2 Views Left  . Lyndon Code Venous Img Lower Unilateral Left  . Flu Vaccine MDCK QUAD PF    Meds ordered this encounter  Medications  . ibuprofen (ADVIL) 600 MG tablet    Sig: Take 1 tablet (600 mg total) by mouth every 8 (eight) hours as needed.    Dispense:  45 tablet    Refill:  1    Order Specific Question:   Supervising Provider    Answer:   [place] [[id number]]    Time spent:  30 Minutes

## 2020-07-24 ENCOUNTER — Ambulatory Visit: Payer: Medicaid Other | Admitting: Hospice and Palliative Medicine

## 2020-07-24 ENCOUNTER — Encounter: Payer: Self-pay | Admitting: Hospice and Palliative Medicine

## 2020-07-24 DIAGNOSIS — Z0001 Encounter for general adult medical examination with abnormal findings: Secondary | ICD-10-CM

## 2020-07-24 DIAGNOSIS — M7122 Synovial cyst of popliteal space [Baker], left knee: Secondary | ICD-10-CM | POA: Diagnosis not present

## 2020-07-24 DIAGNOSIS — I639 Cerebral infarction, unspecified: Secondary | ICD-10-CM | POA: Diagnosis not present

## 2020-07-24 NOTE — Progress Notes (Signed)
[facility] [place],  [address]  Internal MEDICINE  Office Visit Note  Patient Name: Natasha Moreno  [id number]  [id number]  Date of Service: 07/26/2020  Chief Complaint  Patient presents with  . Annual Exam  . Hypertension  . Hyperlipidemia  . Other    lump on left leg,  . Quality Metric Gaps    pap on next visit      HPI Pt is here for routine health maintenance examination Overall, she has been doing well Complains today of a bump on the back of her left leg behind her knee--has been present for about a month and over the course it has continued to grow and has become tender to palpation History of CVA-unclear etiology, continues to take atorvastatin as well as ASA for further prevention No recent follow-up with neurology--encouraged to schedule routine follow-up Was started on BP medication a few months prior--has been compliant with medication, since starting them she has less headaches History of hemoglobin E disorder--monitored on routine labs, remains asymptomatic   Continues to struggle with her weight management--reports since COVID her weight has steadily increased, feels it is hard to make time for meal prepping as well as exercise  Current Medication: Outpatient Encounter Medications as of 07/24/2020  Medication Sig  . aspirin EC 325 MG tablet Take 1 tablet (325 mg total) by mouth daily.  Marland Kitchen atorvastatin (LIPITOR) 10 MG tablet Take 1 tablet (10 mg total) by mouth daily.  . bisoprolol-hydrochlorothiazide (ZIAC) 2.5-6.25 MG tablet TAKE 1 TABLET BY MOUTH EVERY DAY IN THE MORNING FOR BLOOD PRESSURE  . cetirizine (ZYRTEC ALLERGY) 10 MG tablet Take 10 mg by mouth daily as needed for allergies.   Marland Kitchen ibuprofen (ADVIL) 600 MG tablet Take 1 tablet (600 mg total) by mouth every 8 (eight) hours as needed.   Facility-Administered Encounter Medications as of 07/24/2020  Medication  . cyanocobalamin ((VITAMIN B-12)) injection 1,000 mcg     Surgical History: Past Surgical History:  Procedure Laterality Date  . CHOLECYSTECTOMY    . TEE WITHOUT CARDIOVERSION N/A 11/11/2017   Procedure: TRANSESOPHAGEAL ECHOCARDIOGRAM (TEE);  Surgeon: Teodoro Spray, MD;  Location: [facility] ORS;  Service: Cardiovascular;  Laterality: N/A;  . TUBAL LIGATION    . WISDOM TOOTH EXTRACTION      Medical History: Past Medical History:  Diagnosis Date  . Hemoglobin E-A disorder ([place])   . Hyperlipidemia   . Hypertension   . Migraine   . Morbid obesity ([place])   . Stroke [facility])     Family History: Family History  Problem Relation Age of Onset  . Kidney failure Father   . Heart attack Father        open heart surgery  . Hypertension Father   . Diabetes Father   . Gout Father   . Cataracts Father       Review of Systems  Constitutional: Negative for chills, diaphoresis and fatigue.  HENT: Negative for ear pain, postnasal drip and sinus pressure.   Eyes: Negative for photophobia, discharge, redness, itching and visual disturbance.  Respiratory: Negative for cough, shortness of breath and wheezing.   Cardiovascular: Negative for chest pain, palpitations and leg swelling.  Gastrointestinal: Negative for abdominal pain, constipation, diarrhea, nausea and vomiting.  Genitourinary: Negative for dysuria and flank pain.  Musculoskeletal: Negative for arthralgias, back pain, gait problem and neck pain.  Skin: Negative for color change.       Cyst on back of left knee  Allergic/Immunologic: Negative for  environmental allergies and food allergies.  Neurological: Negative for dizziness and headaches.  Hematological: Does not bruise/bleed easily.  Psychiatric/Behavioral: Negative for agitation, behavioral problems (depression) and hallucinations.     Vital Signs: BP 122/80   Pulse 89   Temp 97.8 F (36.6 C)   Resp 16   Ht 5' (1.524 m)   Wt 224 lb (101.6 kg)   LMP 06/29/2020   SpO2 98%   BMI 43.75 kg/m    Physical Exam Vitals  reviewed.  Constitutional:      Appearance: Normal appearance. She is obese.  Cardiovascular:     Rate and Rhythm: Normal rate and regular rhythm.     Pulses: Normal pulses.     Heart sounds: Normal heart sounds.  Pulmonary:     Effort: Pulmonary effort is normal.     Breath sounds: Normal breath sounds.  Chest:     Breasts:        Right: Normal.        Left: Normal.  Abdominal:     General: Abdomen is flat.  Musculoskeletal:        General: Normal range of motion.     Cervical back: Normal range of motion.  Skin:    General: Skin is warm.     Comments: Palpable Baker's Cyst left popliteal fossa  Neurological:     General: No focal deficit present.     Mental Status: She is alert and oriented to person, place, and time. Mental status is at baseline.  Psychiatric:        Mood and Affect: Mood normal.        Behavior: Behavior normal.        Thought Content: Thought content normal.     LABS: Recent Results (from the past 2160 hour(s))  CBC w/Diff/Platelet     Status: Abnormal   Collection Time: 07/24/20 11:19 AM  Result Value Ref Range   WBC 9.4 3.4 - 10.8 x10E3/uL   RBC 6.52 (H) 3.77 - 5.28 x10E6/uL    Comment: Elliptocytes present. Few schistocytes.    Hemoglobin 10.8 (L) 11.1 - 15.9 g/dL   Hematocrit 37.0 34.0 - 46.6 %   MCV 57 (L) 79 - 97 fL   MCH 16.6 (L) 26.6 - 33.0 pg   MCHC 29.2 (L) 31 - 35 g/dL   RDW 24.2 (H) 11.7 - 15.4 %   Platelets 368 150 - 450 x10E3/uL   Neutrophils 64 Not Estab. %   Lymphs 26 Not Estab. %   Monocytes 6 Not Estab. %   Eos 2 Not Estab. %   Basos 1 Not Estab. %   Neutrophils Absolute 6.1 1.40 - 7.00 x10E3/uL   Lymphocytes Absolute 2.4 0 - 3 x10E3/uL   Monocytes Absolute 0.6 0 - 0 x10E3/uL   EOS (ABSOLUTE) 0.2 0.0 - 0.4 x10E3/uL   Basophils Absolute 0.1 0 - 0 x10E3/uL   Immature Granulocytes 1 Not Estab. %   Immature Grans (Abs) 0.1 0.0 - 0.1 x10E3/uL   Hematology Comments: Note:     Comment: Verified by microscopic examination.   Comprehensive Metabolic Panel (CMET)     Status: Abnormal   Collection Time: 07/24/20 11:19 AM  Result Value Ref Range   Glucose 70 65 - 99 mg/dL   BUN 6 6 - 20 mg/dL   Creatinine, Ser 0.54 (L) 0.57 - 1.00 mg/dL   GFR calc non Af Amer 120 >59 mL/min/1.73   GFR calc Af Amer 138 >59 mL/min/1.73    Comment: **  In accordance with recommendations from the NKF-ASN Task force,**   [facility] is in the process of updating its eGFR calculation to the   2021 CKD-EPI creatinine equation that estimates kidney function   without a race variable.    BUN/Creatinine Ratio 11 9 - 23   Sodium 138 134 - 144 mmol/L   Potassium 4.1 3.5 - 5.2 mmol/L   Chloride 104 96 - 106 mmol/L   CO2 23 20 - 29 mmol/L   Calcium 9.1 8.7 - 10.2 mg/dL   Total Protein 7.0 6.0 - 8.5 g/dL   Albumin 4.4 3.8 - 4.8 g/dL   Globulin, Total 2.6 1.5 - 4.5 g/dL   Albumin/Globulin Ratio 1.7 1.2 - 2.2   Bilirubin Total 0.7 0.0 - 1.2 mg/dL   Alkaline Phosphatase 61 44 - 121 IU/L    Comment:               **Please note reference interval change**   AST 31 0 - 40 IU/L   ALT 53 (H) 0 - 32 IU/L  Lipid Panel With LDL/HDL Ratio     Status: None   Collection Time: 07/24/20 11:19 AM  Result Value Ref Range   Cholesterol, Total 122 100 - 199 mg/dL   Triglycerides 114 0 - 149 mg/dL   HDL 48 >39 mg/dL   VLDL Cholesterol Cal 21 5 - 40 mg/dL   LDL Chol Calc (NIH) 53 0 - 99 mg/dL   LDL/HDL Ratio 1.1 0.0 - 3.2 ratio    Comment:                                     LDL/HDL Ratio                                             Men  Women                               1/2 Avg.Risk  1.0    1.5                                   Avg.Risk  3.6    3.2                                2X Avg.Risk  6.2    5.0                                3X Avg.Risk  8.0    6.1   TSH + free T4     Status: None   Collection Time: 07/24/20 11:19 AM  Result Value Ref Range   TSH 2.650 0.450 - 4.500 uIU/mL   Free T4 1.22 0.82 - 1.77 ng/dL   Assessment/Plan: 1. Encounter  for routine adult health examination with abnormal findings Well appearing 39 year old female Up to date on PHM Will review routine annual labs and adjust plan of care accordingly - CBC w/Diff/Platelet - Comprehensive Metabolic Panel (CMET) - Lipid Panel With LDL/HDL Ratio - TSH + free T4  2. Synovial cyst of left popliteal space Will obtain US and adjust plan of care accordingly - US Venous Img Lower Bilateral; Future  3. Morbid obesity ([place]) Discussed to focus on calorie restriction--goal is 1800 calories and to incorporate brisk waking into her routine at least 2-3 times per week for 20-30 minutes Encouraged to keep a food diary and to bring in to next follow-up for further review and discussions of her eating habits  4. Cerebrovascular accident (CVA), unspecified mechanism ([place]) No residual symptoms, no recurring symptoms at this time, has not been seen by neurology since 2020, encouraged to schedule annual follow-up  General Counseling: Natasha Moreno understanding of the findings of todays visit and agrees with plan of treatment. I have discussed any further diagnostic evaluation that may be needed or ordered today. We also reviewed her medications today. she has been encouraged to call the office with any questions or concerns that should arise related to todays visit.    Counseling: Obesity Counseling: Risk Assessment: An assessment of behavioral risk factors was made today and includes lack of exercise sedentary lifestyle, lack of portion control and poor dietary habits.  Risk Modification Advice: She was counseled on portion control guidelines. Restricting daily caloric intake to 1800. The detrimental long term effects of obesity on her health and ongoing poor compliance was also discussed with the patient.   Orders Placed This Encounter  Procedures  . US Venous Img Lower Bilateral  . CBC w/Diff/Platelet  . Comprehensive Metabolic Panel (CMET)  . Lipid Panel With  LDL/HDL Ratio  . TSH + free T4      Total time spent: 30 Minutes  Time spent includes review of chart, medications, test results, and follow up plan with the patient.   This patient was seen by Casey Burkitt AGNP-C Collaboration with Dr Lavera Guise as a part of collaborative care agreement   Tanna Furry. [facility] Internal Medicine

## 2020-07-25 ENCOUNTER — Other Ambulatory Visit: Payer: Self-pay

## 2020-07-25 DIAGNOSIS — M7122 Synovial cyst of popliteal space [Baker], left knee: Secondary | ICD-10-CM

## 2020-07-25 LAB — CBC WITH DIFFERENTIAL/PLATELET
Basophils Absolute: 0.1 10*3/uL (ref 0.0–0.2)
Basos: 1 %
EOS (ABSOLUTE): 0.2 10*3/uL (ref 0.0–0.4)
Eos: 2 %
Hematocrit: 37 % (ref 34.0–46.6)
Hemoglobin: 10.8 g/dL — ABNORMAL LOW (ref 11.1–15.9)
Immature Grans (Abs): 0.1 10*3/uL (ref 0.0–0.1)
Immature Granulocytes: 1 %
Lymphocytes Absolute: 2.4 10*3/uL (ref 0.7–3.1)
Lymphs: 26 %
MCH: 16.6 pg — ABNORMAL LOW (ref 26.6–33.0)
MCHC: 29.2 g/dL — ABNORMAL LOW (ref 31.5–35.7)
MCV: 57 fL — ABNORMAL LOW (ref 79–97)
Monocytes Absolute: 0.6 10*3/uL (ref 0.1–0.9)
Monocytes: 6 %
Neutrophils Absolute: 6.1 10*3/uL (ref 1.4–7.0)
Neutrophils: 64 %
Platelets: 368 10*3/uL (ref 150–450)
RBC: 6.52 x10E6/uL — ABNORMAL HIGH (ref 3.77–5.28)
RDW: 24.2 % — ABNORMAL HIGH (ref 11.7–15.4)
WBC: 9.4 10*3/uL (ref 3.4–10.8)

## 2020-07-25 LAB — LIPID PANEL WITH LDL/HDL RATIO
Cholesterol, Total: 122 mg/dL (ref 100–199)
HDL: 48 mg/dL (ref >39)
LDL Chol Calc (NIH): 53 mg/dL (ref 0–99)
LDL/HDL Ratio: 1.1 ratio (ref 0.0–3.2)
Triglycerides: 114 mg/dL (ref 0–149)
VLDL Cholesterol Cal: 21 mg/dL (ref 5–40)

## 2020-07-25 LAB — COMPREHENSIVE METABOLIC PANEL
ALT: 53 IU/L — ABNORMAL HIGH (ref 0–32)
AST: 31 IU/L (ref 0–40)
Albumin/Globulin Ratio: 1.7 (ref 1.2–2.2)
Albumin: 4.4 g/dL (ref 3.8–4.8)
Alkaline Phosphatase: 61 IU/L (ref 44–121)
BUN/Creatinine Ratio: 11 (ref 9–23)
BUN: 6 mg/dL (ref 6–20)
Bilirubin Total: 0.7 mg/dL (ref 0.0–1.2)
CO2: 23 mmol/L (ref 20–29)
Calcium: 9.1 mg/dL (ref 8.7–10.2)
Chloride: 104 mmol/L (ref 96–106)
Creatinine, Ser: 0.54 mg/dL — ABNORMAL LOW (ref 0.57–1.00)
GFR calc Af Amer: 138 mL/min/{1.73_m2} (ref >59)
GFR calc non Af Amer: 120 mL/min/{1.73_m2} (ref >59)
Globulin, Total: 2.6 g/dL (ref 1.5–4.5)
Glucose: 70 mg/dL (ref 65–99)
Potassium: 4.1 mmol/L (ref 3.5–5.2)
Sodium: 138 mmol/L (ref 134–144)
Total Protein: 7 g/dL (ref 6.0–8.5)

## 2020-07-25 LAB — TSH+FREE T4
Free T4: 1.22 ng/dL (ref 0.82–1.77)
TSH: 2.65 u[IU]/mL (ref 0.450–4.500)

## 2020-07-26 ENCOUNTER — Encounter: Payer: Self-pay | Admitting: Hospice and Palliative Medicine

## 2020-07-27 ENCOUNTER — Other Ambulatory Visit: Payer: Self-pay

## 2020-07-27 ENCOUNTER — Ambulatory Visit: Payer: Medicaid Other

## 2020-07-27 DIAGNOSIS — M7122 Synovial cyst of popliteal space [Baker], left knee: Secondary | ICD-10-CM | POA: Diagnosis not present

## 2020-08-03 ENCOUNTER — Other Ambulatory Visit: Payer: Self-pay | Admitting: Hospice and Palliative Medicine

## 2020-08-03 DIAGNOSIS — M7122 Synovial cyst of popliteal space [Baker], left knee: Secondary | ICD-10-CM

## 2020-08-08 ENCOUNTER — Other Ambulatory Visit: Payer: Medicaid Other

## 2020-08-09 ENCOUNTER — Encounter: Payer: Self-pay | Admitting: Internal Medicine

## 2020-08-09 ENCOUNTER — Other Ambulatory Visit: Payer: Self-pay

## 2020-08-09 ENCOUNTER — Ambulatory Visit: Payer: Medicaid Other | Admitting: Internal Medicine

## 2020-08-09 VITALS — BP 126/70 | HR 80 | Temp 97.7°F | Resp 16 | Ht 60.0 in | Wt 227.4 lb

## 2020-08-09 DIAGNOSIS — G478 Other sleep disorders: Secondary | ICD-10-CM

## 2020-08-09 DIAGNOSIS — M7122 Synovial cyst of popliteal space [Baker], left knee: Secondary | ICD-10-CM | POA: Diagnosis not present

## 2020-08-09 NOTE — Progress Notes (Signed)
[facility] [address] [place], [place] [address]  Internal MEDICINE  Office Visit Note  Patient Name: Natasha Moreno  [id number]  [id number]  Date of Service: 08/10/2020  Chief Complaint  Patient presents with  . Acute Visit    wants note for work  . Knee Pain    left, pt went back to work for a week and pain return, it didn't stop hurting from when she was here 2 weeks ago and has now gotten worse  . policy update form    received  . Hypertension  . Hyperlipidemia    HPI  Pt is here with c/o walking difficult, has Baker's cyst and is scheduled to see ortho tomorrow, since she is unable to perfrom her duties at work she will like to have a work note    Current Medication: Outpatient Encounter Medications as of 08/09/2020  Medication Sig  . aspirin EC 325 MG tablet Take 1 tablet (325 mg total) by mouth daily.  Marland Kitchen atorvastatin (LIPITOR) 10 MG tablet Take 1 tablet (10 mg total) by mouth daily.  . bisoprolol-hydrochlorothiazide (ZIAC) 2.5-6.25 MG tablet TAKE 1 TABLET BY MOUTH EVERY DAY IN THE MORNING FOR BLOOD PRESSURE  . cetirizine (ZYRTEC ALLERGY) 10 MG tablet Take 10 mg by mouth daily as needed for allergies.   Marland Kitchen ibuprofen (ADVIL) 600 MG tablet Take 1 tablet (600 mg total) by mouth every 8 (eight) hours as needed.   Facility-Administered Encounter Medications as of 08/09/2020  Medication  . cyanocobalamin ((VITAMIN B-12)) injection 1,000 mcg    Surgical History: Past Surgical History:  Procedure Laterality Date  . CHOLECYSTECTOMY    . TEE WITHOUT CARDIOVERSION N/A 11/11/2017   Procedure: TRANSESOPHAGEAL ECHOCARDIOGRAM (TEE);  Surgeon: Dalia Heading, MD;  Location: [facility] ORS;  Service: Cardiovascular;  Laterality: N/A;  . TUBAL LIGATION    . WISDOM TOOTH EXTRACTION      Medical History: Past Medical History:  Diagnosis Date  . Hemoglobin E-A disorder ([facility])   . Hyperlipidemia   . Hypertension   . Migraine   . Morbid obesity ([facility])   . Stroke  [facility])     Family History: Family History  Problem Relation Age of Onset  . Kidney failure Father   . Heart attack Father        open heart surgery  . Hypertension Father   . Diabetes Father   . Gout Father   . Cataracts Father     Social History   Socioeconomic History  . Marital status: Single    Spouse name: Not on file  . Number of children: 2  . Years of education: Not on file  . Highest education level: High school graduate  Occupational History  . Not on file  Tobacco Use  . Smoking status: Former Smoker    Packs/day: 0.25    Types: Cigarettes    Quit date: 11/09/2017    Years since quitting: 2.7  . Smokeless tobacco: Never Used  . Tobacco comment: approximately 4 cigarettes a day  Vaping Use  . Vaping Use: Former  Substance and Sexual Activity  . Alcohol use: Yes    Comment: every other weekend, not much   . Drug use: No  . Sexual activity: Yes    Birth control/protection: Condom  Other Topics Concern  . Not on file  Social History Narrative   Lives at home with her parents, brother and her children   Right handed   Drinks rare caffeine    Social  Determinants of Health   Financial Resource Strain:   . Difficulty of Paying Living Expenses: Not on file  Food Insecurity:   . Worried About Programme researcher, broadcasting/film/video in the Last Year: Not on file  . Ran Out of Food in the Last Year: Not on file  Transportation Needs:   . Lack of Transportation (Medical): Not on file  . Lack of Transportation (Non-Medical): Not on file  Physical Activity:   . Days of Exercise per Week: Not on file  . Minutes of Exercise per Session: Not on file  Stress:   . Feeling of Stress : Not on file  Social Connections:   . Frequency of Communication with Friends and Family: Not on file  . Frequency of Social Gatherings with Friends and Family: Not on file  . Attends Religious Services: Not on file  . Active Member of Clubs or Organizations: Not on file  . Attends Tax inspector Meetings: Not on file  . Marital Status: Not on file  Intimate Partner Violence:   . Fear of Current or Ex-Partner: Not on file  . Emotionally Abused: Not on file  . Physically Abused: Not on file  . Sexually Abused: Not on file      Review of Systems  Constitutional: Negative.   HENT: Negative.   Respiratory: Negative.   Cardiovascular: Negative.   Musculoskeletal: Positive for gait problem and joint swelling.    Vital Signs: BP 126/70   Pulse 80   Temp 97.7 F (36.5 C)   Resp 16   Ht 5' (1.524 m)   Wt 227 lb 6.4 oz (103.1 kg)   SpO2 98%   BMI 44.41 kg/m    Physical Exam Musculoskeletal:        General: Swelling and deformity present.     Assessment/Plan: 1. Baker cyst, left Pt is to see ortho in am   2. Difficulty waking Pt has walking difficulty, pain and cyst, will get work note  Form 11/14- 11/19  General Counseling: Jani Files understanding of the findings of todays visit and agrees with plan of treatment. I have discussed any further diagnostic evaluation that may be needed or ordered today. We also reviewed her medications today. she has been encouraged to call the office with any questions or concerns that should arise related to todays visit.    Total time spent:20 Minutes Time spent includes review of chart, medications, test results, and follow up plan with the patient.    Dr Lyndon Code Internal medicine

## 2020-08-12 DIAGNOSIS — M7122 Synovial cyst of popliteal space [Baker], left knee: Secondary | ICD-10-CM | POA: Insufficient documentation

## 2020-08-12 DIAGNOSIS — Z23 Encounter for immunization: Secondary | ICD-10-CM | POA: Insufficient documentation

## 2020-08-12 DIAGNOSIS — M25562 Pain in left knee: Secondary | ICD-10-CM | POA: Insufficient documentation

## 2020-08-15 ENCOUNTER — Ambulatory Visit: Payer: Medicaid Other | Admitting: Nurse Practitioner

## 2020-09-04 ENCOUNTER — Other Ambulatory Visit: Payer: Self-pay

## 2020-09-04 ENCOUNTER — Ambulatory Visit: Payer: Medicaid Other | Admitting: Hospice and Palliative Medicine

## 2020-09-04 ENCOUNTER — Encounter: Payer: Self-pay | Admitting: Hospice and Palliative Medicine

## 2020-09-04 VITALS — BP 120/72 | HR 74 | Temp 98.0°F | Resp 16 | Ht 60.0 in | Wt 222.6 lb

## 2020-09-04 DIAGNOSIS — M7122 Synovial cyst of popliteal space [Baker], left knee: Secondary | ICD-10-CM | POA: Diagnosis not present

## 2020-09-04 DIAGNOSIS — R748 Abnormal levels of other serum enzymes: Secondary | ICD-10-CM | POA: Diagnosis not present

## 2020-09-04 DIAGNOSIS — I639 Cerebral infarction, unspecified: Secondary | ICD-10-CM | POA: Diagnosis not present

## 2020-09-04 NOTE — Progress Notes (Signed)
[facility] [address] [place], [place] [address]  Internal MEDICINE  Office Visit Note  Patient Name: Natasha Moreno  [id number]  [id number]  Date of Service: 09/07/2020  Chief Complaint  Patient presents with   Follow-up    Review labs and ultrasound    HPI Patient is here for routine follow-up Reviewed he recent labs-stable, slightly elevated ALT, does not drink ETOH, no abdominal sympotms History of hemoglobin A-E disorder--anemia is stable  [place] 11/5-3 cm Baker cyst to left leg-has been seen by ortho, received steroid injection, pain subsided for a few days but has returned Pain not as bad as it was initially, able to continue with working--will be following up with ortho again Has not scheduled appointment with neurology for routine follow-up s/p CVA--feels this is not necessary at this time  Sleeping well-does work night shift but has no problems with sleeping during the day Stress and anxiety levels are well controlled No changes in her appetite or elimination habits  Current Medication: Outpatient Encounter Medications as of 09/04/2020  Medication Sig   aspirin EC 325 MG tablet Take 1 tablet (325 mg total) by mouth daily.   atorvastatin (LIPITOR) 10 MG tablet Take 1 tablet (10 mg total) by mouth daily.   bisoprolol-hydrochlorothiazide (ZIAC) 2.5-6.25 MG tablet TAKE 1 TABLET BY MOUTH EVERY DAY IN THE MORNING FOR BLOOD PRESSURE   cetirizine (ZYRTEC) 10 MG tablet Take 10 mg by mouth daily as needed for allergies.    ibuprofen (ADVIL) 600 MG tablet Take 1 tablet (600 mg total) by mouth every 8 (eight) hours as needed.   Facility-Administered Encounter Medications as of 09/04/2020  Medication   cyanocobalamin ((VITAMIN B-12)) injection 1,000 mcg    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HCC)    Hyperlipidemia    Hypertension    Migraine    Morbid obesity (HCC)    Stroke (HCC)     Family History: Family History  Problem Relation Age of Onset   Kidney failure Father    Heart attack Father        open heart surgery   Hypertension Father    Diabetes Father    Gout Father    Cataracts Father     Social History   Socioeconomic History   Marital status: Single    Spouse name: Not on file   Number of children: 2   Years of education: Not on file   Highest education level: High school graduate  Occupational History   Not on file  Tobacco Use   Smoking status: Former Smoker    Packs/day: 0.25    Types: Cigarettes    Quit date: 11/09/2017    Years since quitting: 2.8   Smokeless tobacco: Never Used   Tobacco comment: approximately 4 cigarettes a day  Vaping Use   Vaping Use: Former  Substance and Sexual Activity   Alcohol use: Yes    Comment: every other weekend, not much    Drug use: No   Sexual activity: Yes    Birth control/protection: Condom  Other Topics Concern   Not on file  Social History Narrative   Lives at home with her parents, brother and her children   Right handed   Drinks rare caffeine    Social Determinants of Health   Financial Resource Strain: Not on file  Food Insecurity: Not on file  Transportation Needs: Not on file  Physical Activity: Not on file  Stress: Not on file  Social Connections: Not on file  Intimate Partner Violence: Not on file      Review of Systems  Constitutional: Negative for chills, diaphoresis and fatigue.  HENT: Negative for ear pain, postnasal drip and sinus pressure.   Eyes: Negative for photophobia, discharge, redness, itching and visual disturbance.   Respiratory: Negative for cough, shortness of breath and wheezing.   Cardiovascular: Negative for chest pain, palpitations and leg swelling.  Gastrointestinal: Negative for abdominal pain, constipation, diarrhea, nausea and vomiting.  Genitourinary: Negative for dysuria and flank pain.  Musculoskeletal: Negative for arthralgias, back pain, gait problem and neck pain.       Left knee pain  Skin: Negative for color change.  Allergic/Immunologic: Negative for environmental allergies and food allergies.  Neurological: Negative for dizziness and headaches.  Hematological: Does not bruise/bleed easily.  Psychiatric/Behavioral: Negative for agitation, behavioral problems (depression) and hallucinations.    Vital Signs: BP 120/72    Pulse 74    Temp 98 F (36.7 C)    Resp 16    Ht 5' (1.524 m)    Wt 222 lb 9.6 oz (101 kg)    SpO2 98%    BMI 43.47 kg/m    Physical Exam Vitals reviewed.  Constitutional:      Appearance: Normal appearance. She is obese.  Cardiovascular:     Rate and Rhythm: Normal rate and regular rhythm.     Pulses: Normal pulses.     Heart sounds: Normal heart sounds.  Pulmonary:     Effort: Pulmonary effort is normal.     Breath sounds: Normal breath sounds.  Abdominal:     General: Abdomen is flat.     Palpations: Abdomen is soft.  Musculoskeletal:        General: Normal range of motion.     Cervical back: Normal range of motion.  Skin:    General: Skin is warm.  Neurological:     General: No focal deficit present.     Mental Status: She is alert and oriented to person, place, and time. Mental status is at baseline.  Psychiatric:        Mood and Affect: Mood normal.        Behavior: Behavior normal.        Thought Content: Thought content normal.        Judgment: Judgment normal.     Assessment/Plan: 1. Baker cyst, left Has been evaluated and treated by orthopaedics, pain has returned, encouraged to schedule follow-up for further treatment  2. Elevated  liver enzymes Will recheck levels and continue monitoring May consider abdominal US if remains elevated - Comprehensive Metabolic Panel (CMET)  3. Cerebrovascular accident (CVA), unspecified mechanism (HCC) S/p CVA 2020, no residual effects, unclear etiology, remains on statin therapy, no recent follow-up with neurology  4. Morbid obesity (HCC) Discussed the importance of weight management through healthy eating and daily exercise as tolerated. Discussed the negative effects obesity has on pulmonary health, cardiac health as well as overall general health and well being. Consider OSA testing   General Counseling: riniyah speich understanding of the findings of todays visit and agrees with plan  of treatment. I have discussed any further diagnostic evaluation that may be needed or ordered today. We also reviewed her medications today. she has been encouraged to call the office with any questions or concerns that should arise related to todays visit.    Orders Placed This Encounter  Procedures   Comprehensive Metabolic Panel (CMET)      Time spent: 30 Minutes Time spent includes review of chart, medications, test results and follow-up plan with the patient.  This patient was seen by Leeanne Deed AGNP-C in Collaboration with Dr Lyndon Code as a part of collaborative care agreement     Lubertha Basque. Giulian Goldring AGNP-C Internal medicine

## 2020-09-07 ENCOUNTER — Encounter: Payer: Self-pay | Admitting: Hospice and Palliative Medicine

## 2020-10-02 ENCOUNTER — Ambulatory Visit
Admission: RE | Admit: 2020-10-02 | Discharge: 2020-10-02 | Disposition: A | Discharge status: Home / Self Care | Payer: Medicaid Other | Source: Ambulatory Visit | Attending: Internal Medicine | Admitting: Internal Medicine

## 2020-10-02 ENCOUNTER — Ambulatory Visit
Admission: RE | Admit: 2020-10-02 | Discharge: 2020-10-02 | Disposition: A | Discharge status: Home / Self Care | Payer: Medicaid Other | Attending: Internal Medicine | Admitting: Internal Medicine

## 2020-10-02 ENCOUNTER — Other Ambulatory Visit: Payer: Self-pay

## 2020-10-02 ENCOUNTER — Ambulatory Visit: Payer: Medicaid Other | Admitting: Internal Medicine

## 2020-10-02 VITALS — Resp 16 | Ht 60.0 in | Wt 225.0 lb

## 2020-10-02 DIAGNOSIS — J45909 Unspecified asthma, uncomplicated: Secondary | ICD-10-CM

## 2020-10-02 NOTE — Progress Notes (Signed)
[facility] [address] [place], [place] [address]  Internal MEDICINE  Telephone Visit  Patient Name: Natasha Moreno  [id number]  [id number]  Date of Service: 10/03/2020  I connected with the patient at [phone] by telephone and verified the patients identity using two identifiers.   I discussed the limitations, risks, security and privacy concerns of performing an evaluation and management service by telephone and the availability of in person appointments. I also discussed with the patient that there may be a patient responsible charge related to the service.  The patient expressed understanding and agrees to proceed.    Chief Complaint  Patient presents with  . Acute Visit    Fever body aches, cold chills, neg. Covid test, fully vaccinated, started Wednesday, when pt breaths there is a sharp pain on left side, congested   . Telephone Assessment    [phone]   . Telephone Screen    Phone call   . Hyperlipidemia  . Hypertension    HPI  Pt is connected via phone, feels congested and body aches. Cough and chest congestion, she needs a note to go back to work, denies any fever or chills. She did have negative covid test    Current Medication: Outpatient Encounter Medications as of 10/02/2020  Medication Sig  . albuterol (VENTOLIN HFA) 108 (90 Base) MCG/ACT inhaler Inhale 2 puffs into the lungs every 6 (six) hours as needed for wheezing or shortness of breath.  Marland Kitchen azithromycin (ZITHROMAX) 250 MG tablet Use as directed for chest congestion  . aspirin EC 325 MG tablet Take 1 tablet (325 mg total) by mouth daily.  Marland Kitchen atorvastatin (LIPITOR) 10 MG tablet Take 1 tablet (10 mg total) by mouth daily.  . bisoprolol-hydrochlorothiazide (ZIAC) 2.5-6.25 MG tablet TAKE 1 TABLET BY MOUTH EVERY DAY IN THE MORNING FOR BLOOD PRESSURE  . cetirizine (ZYRTEC) 10 MG tablet Take 10 mg by mouth daily as needed for allergies.   Marland Kitchen ibuprofen (ADVIL) 600 MG tablet Take 1 tablet (600 mg total) by  mouth every 8 (eight) hours as needed.   Facility-Administered Encounter Medications as of 10/02/2020  Medication  . cyanocobalamin ((VITAMIN B-12)) injection 1,000 mcg    Surgical History: Past Surgical History:  Procedure Laterality Date  . CHOLECYSTECTOMY    . TEE WITHOUT CARDIOVERSION N/A 11/11/2017   Procedure: TRANSESOPHAGEAL ECHOCARDIOGRAM (TEE);  Surgeon: Dalia Heading, MD;  Location: [facility] ORS;  Service: Cardiovascular;  Laterality: N/A;  . TUBAL LIGATION    . WISDOM TOOTH EXTRACTION      Medical History: Past Medical History:  Diagnosis Date  . Hemoglobin E-A disorder (HCC)   . Hyperlipidemia   . Hypertension   . Migraine   . Morbid obesity (HCC)   . Stroke [facility])     Family History: Family History  Problem Relation Age of Onset  . Kidney failure Father   . Heart attack Father        open heart surgery  . Hypertension Father   . Diabetes Father   . Gout Father   . Cataracts Father     Social History   Socioeconomic History  . Marital status: Single    Spouse name: Not on file  . Number of children: 2  . Years of education: Not on file  . Highest education level: High school graduate  Occupational History  . Not on file  Tobacco Use  . Smoking status: Former Smoker    Packs/day: 0.25    Types: Cigarettes  Quit date: 11/09/2017    Years since quitting: 2.9  . Smokeless tobacco: Never Used  . Tobacco comment: approximately 4 cigarettes a day  Vaping Use  . Vaping Use: Former  Substance and Sexual Activity  . Alcohol use: Yes    Comment: every other weekend, not much   . Drug use: No  . Sexual activity: Yes    Birth control/protection: Condom  Other Topics Concern  . Not on file  Social History Narrative   Lives at home with her parents, brother and her children   Right handed   Drinks rare caffeine    Social Determinants of Health   Financial Resource Strain: Not on file  Food Insecurity: Not on file  Transportation Needs: Not on file   Physical Activity: Not on file  Stress: Not on file  Social Connections: Not on file  Intimate Partner Violence: Not on file      Review of Systems  Constitutional: Negative.   HENT: Positive for congestion.   Respiratory: Positive for cough.   Gastrointestinal: Negative.     Vital Signs: Resp 16   Ht 5' (1.524 m)   Wt 225 lb (102.1 kg)   LMP 09/24/2020   BMI 43.94 kg/m    Observation/Objective:  Pt is congested upon coughing,   Assessment/Plan: 1. Acute asthmatic bronchitis Pt is sent for CXR, Will be ok to go to work if it is negative  - DG Chest 2 View; Future - albuterol (VENTOLIN HFA) 108 (90 Base) MCG/ACT inhaler; Inhale 2 puffs into the lungs every 6 (six) hours as needed for wheezing or shortness of breath.  Dispense: 8 g; Refill: 0 - azithromycin (ZITHROMAX) 250 MG tablet; Use as directed for chest congestion  Dispense: 6 tablet; Refill: 0  General Counseling: Lannie verbalizes understanding of the findings of today's phone visit and agrees with plan of treatment. I have discussed any further diagnostic evaluation that may be needed or ordered today. We also reviewed her medications today. she has been encouraged to call the office with any questions or concerns that should arise related to todays visit.    Orders Placed This Encounter  Procedures  . DG Chest 2 View    Meds ordered this encounter  Medications  . albuterol (VENTOLIN HFA) 108 (90 Base) MCG/ACT inhaler    Sig: Inhale 2 puffs into the lungs every 6 (six) hours as needed for wheezing or shortness of breath.    Dispense:  8 g    Refill:  0  . azithromycin (ZITHROMAX) 250 MG tablet    Sig: Use as directed for chest congestion    Dispense:  6 tablet    Refill:  0    Time spent:15 Minutes    Dr Lyndon Code Internal medicine

## 2020-10-03 ENCOUNTER — Telehealth: Payer: Self-pay

## 2020-10-03 MED ORDER — ALBUTEROL SULFATE HFA 108 (90 BASE) MCG/ACT IN AERS: 2.0000 | INHALATION_SPRAY | Freq: Four times a day (QID) | RESPIRATORY_TRACT | 0 refills | Status: DC | PRN

## 2020-10-03 MED ORDER — AZITHROMYCIN 250 MG PO TABS: ORAL_TABLET | ORAL | 0 refills | Status: DC

## 2020-10-03 NOTE — Telephone Encounter (Signed)
-----   Message from Lyndon Code, MD sent at 10/03/2020 11:12 AM EST ----- Please let pt know her cxr is normal, and she can go back to work

## 2020-10-03 NOTE — Telephone Encounter (Signed)
Spoke to pt, informed her cxr is normal and she can return to work, also informed her an inhaler and abx was sent in to her pharmacy.

## 2020-10-26 ENCOUNTER — Other Ambulatory Visit: Payer: Self-pay | Admitting: Internal Medicine

## 2020-10-26 DIAGNOSIS — J45909 Unspecified asthma, uncomplicated: Secondary | ICD-10-CM

## 2020-12-05 NOTE — Telephone Encounter (Signed)
This has been taken care of. Natasha Moreno

## 2020-12-26 ENCOUNTER — Encounter: Payer: Self-pay | Admitting: Hospice and Palliative Medicine

## 2020-12-26 ENCOUNTER — Ambulatory Visit: Payer: Medicaid Other | Admitting: Hospice and Palliative Medicine

## 2020-12-26 VITALS — BP 128/86 | HR 100 | Temp 97.5°F | Resp 16 | Ht 60.0 in | Wt 226.2 lb

## 2020-12-26 DIAGNOSIS — R600 Localized edema: Secondary | ICD-10-CM | POA: Diagnosis not present

## 2020-12-26 DIAGNOSIS — Z8673 Personal history of transient ischemic attack (TIA), and cerebral infarction without residual deficits: Secondary | ICD-10-CM | POA: Diagnosis not present

## 2020-12-26 NOTE — Progress Notes (Signed)
[facility] [address] [place], [place] [address]  Internal MEDICINE  Office Visit Note  Patient Name: Natasha Moreno  [id number]  [id number]  Date of Service: 12/28/2020  Chief Complaint  Patient presents with  . Acute Visit    Both feet and legs swollen, no fall, retaining fluid, painful when pt walks, no discoloration, started Thursday morning, was worse yesterday but today it has gone down some     HPI Pt is here for a sick visit. Complaining of bilateral lower extremity edema--started over the weekend, first noticed swelling in her left lower extremity, tenderness to extremity as the skin was taut--Monday evening into Tuesday morning started noticing swelling in right lower extremity Has tried elevating legs and swelling has somewhat improved but by the end of the day both legs are fairly swollen and tender to touch Has not taken BP meds in over 2 weeks which includes HCTZ History of CVA and hemoglobin A-E disorder, increased risk if clotting  Denies shortness of breath or chest pain   Current Medication:  Outpatient Encounter Medications as of 12/26/2020  Medication Sig  . aspirin EC 325 MG tablet Take 1 tablet (325 mg total) by mouth daily.  Marland Kitchen atorvastatin (LIPITOR) 10 MG tablet Take 1 tablet (10 mg total) by mouth daily.  . bisoprolol-hydrochlorothiazide (ZIAC) 2.5-6.25 MG tablet TAKE 1 TABLET BY MOUTH EVERY DAY IN THE MORNING FOR BLOOD PRESSURE  . cetirizine (ZYRTEC) 10 MG tablet Take 10 mg by mouth daily as needed for allergies.   Marland Kitchen ibuprofen (ADVIL) 600 MG tablet Take 1 tablet (600 mg total) by mouth every 8 (eight) hours as needed.  Marland Kitchen PROAIR HFA 108 (90 Base) MCG/ACT inhaler TAKE 2 PUFFS BY MOUTH EVERY 6 HOURS AS NEEDED FOR WHEEZE OR SHORTNESS OF BREATH  . [DISCONTINUED] azithromycin (ZITHROMAX) 250 MG tablet Use as directed for chest congestion (Patient not taking: Reported on 12/26/2020)   Facility-Administered Encounter Medications as of 12/26/2020   Medication  . cyanocobalamin ((VITAMIN B-12)) injection 1,000 mcg      Medical History: Past Medical History:  Diagnosis Date  . Hemoglobin E-A disorder (HCC)   . Hyperlipidemia   . Hypertension   . Migraine   . Morbid obesity (HCC)   . Stroke [facility])      Vital Signs: BP 128/86   Pulse 100   Temp (!) 97.5 F (36.4 C)   Resp 16   Ht 5' (1.524 m)   Wt 226 lb 3.2 oz (102.6 kg)   SpO2 97%   BMI 44.18 kg/m    Review of Systems  Constitutional: Negative for chills, diaphoresis and fatigue.  HENT: Negative for ear pain, postnasal drip and sinus pressure.   Eyes: Negative for photophobia, discharge, redness, itching and visual disturbance.  Respiratory: Negative for cough, shortness of breath and wheezing.   Cardiovascular: Positive for leg swelling. Negative for chest pain and palpitations.  Gastrointestinal: Negative for abdominal pain, constipation, diarrhea, nausea and vomiting.  Genitourinary: Negative for dysuria and flank pain.  Musculoskeletal: Negative for arthralgias, back pain, gait problem and neck pain.  Skin: Negative for color change.  Allergic/Immunologic: Negative for environmental allergies and food allergies.  Neurological: Negative for dizziness and headaches.  Hematological: Does not bruise/bleed easily.  Psychiatric/Behavioral: Negative for agitation, behavioral problems (depression) and hallucinations.    Physical Exam Vitals reviewed.  Constitutional:      Appearance: Normal appearance. She is obese.  Cardiovascular:     Rate and Rhythm: Normal rate and regular rhythm.  Pulses: Normal pulses.     Heart sounds: Normal heart sounds.  Pulmonary:     Effort: Pulmonary effort is normal.     Breath sounds: Normal breath sounds.  Musculoskeletal:        General: Normal range of motion.     Cervical back: Normal range of motion.     Right lower leg: 1+ Edema present.     Left lower leg: 2+ Edema present.  Skin:    General: Skin is warm.   Neurological:     General: No focal deficit present.     Mental Status: She is alert and oriented to person, place, and time. Mental status is at baseline.  Psychiatric:        Mood and Affect: Mood normal.        Behavior: Behavior normal.        Thought Content: Thought content normal.        Judgment: Judgment normal.   Assessment/Plan: 1. Bilateral lower extremity edema Advised to restart Ziac daily ABI slightly abnormal left leg 0.90 [place] to r/o DVT Echocardiogram to r/o underlying cardiac etiology - VAS [place] LOWER EXTREMITY VENOUS (DVT); Future - ECHOCARDIOGRAM COMPLETE; Future - POCT ABI Screening Pilot No Charge  2. History of CVA (cerebrovascular accident) Increased risk of clotting--monitor closely  General Counseling: Avanthika verbalizes understanding of the findings of todays visit and agrees with plan of treatment. I have discussed any further diagnostic evaluation that may be needed or ordered today. We also reviewed her medications today. she has been encouraged to call the office with any questions or concerns that should arise related to todays visit.   Orders Placed This Encounter  Procedures  . ECHOCARDIOGRAM COMPLETE  . VAS [place] LOWER EXTREMITY VENOUS (DVT)  . POCT ABI Screening Pilot No Charge    Time spent: 30 Minutes Time spent includes review of chart, medications, test results and follow-up plan with the patient.  This patient was seen by Leeanne Deed AGNP-C in Collaboration with Dr Lyndon Code as a part of collaborative care agreement.  Lubertha Basque [facility] Internal Medicine

## 2020-12-28 ENCOUNTER — Encounter: Payer: Self-pay | Admitting: Hospice and Palliative Medicine

## 2021-01-30 ENCOUNTER — Ambulatory Visit: Payer: Medicaid Other

## 2021-01-30 DIAGNOSIS — R600 Localized edema: Secondary | ICD-10-CM

## 2021-02-06 ENCOUNTER — Other Ambulatory Visit: Payer: Self-pay

## 2021-02-06 ENCOUNTER — Ambulatory Visit: Payer: Medicaid Other

## 2021-02-06 DIAGNOSIS — R600 Localized edema: Secondary | ICD-10-CM

## 2021-02-07 ENCOUNTER — Encounter: Payer: Self-pay | Admitting: Physician Assistant

## 2021-02-07 ENCOUNTER — Ambulatory Visit: Payer: Medicaid Other | Admitting: Physician Assistant

## 2021-02-07 DIAGNOSIS — Z6841 Body Mass Index (BMI) 40.0 and over, adult: Secondary | ICD-10-CM | POA: Diagnosis not present

## 2021-02-07 DIAGNOSIS — I517 Cardiomegaly: Secondary | ICD-10-CM

## 2021-02-07 DIAGNOSIS — J069 Acute upper respiratory infection, unspecified: Secondary | ICD-10-CM

## 2021-02-07 DIAGNOSIS — Z8673 Personal history of transient ischemic attack (TIA), and cerebral infarction without residual deficits: Secondary | ICD-10-CM

## 2021-02-07 DIAGNOSIS — G479 Sleep disorder, unspecified: Secondary | ICD-10-CM

## 2021-02-07 DIAGNOSIS — J45909 Unspecified asthma, uncomplicated: Secondary | ICD-10-CM

## 2021-02-07 MED ORDER — BENZONATATE 100 MG PO CAPS: 100.0000 mg | ORAL_CAPSULE | Freq: Two times a day (BID) | ORAL | 0 refills | Status: DC | PRN

## 2021-02-07 MED ORDER — ALBUTEROL SULFATE HFA 108 (90 BASE) MCG/ACT IN AERS: INHALATION_SPRAY | RESPIRATORY_TRACT | 1 refills | Status: DC

## 2021-02-07 NOTE — Progress Notes (Signed)
[facility] [address] [place], [place] [address]  Internal MEDICINE  Office Visit Note  Patient Name: Natasha Moreno  [id number]  [id number]  Date of Service: 02/08/2021  Chief Complaint  Patient presents with  . Follow-up    Bad cough and congestion      HPI Pt is here for a sick visit. -Has been feeling sick since Saturday. Tested neg to covid and flu and was put on zpak at [facility]. Also taking nyquil, halls and mucinex. Coughing all day and night with lots of congestion and fatigue. -Vaccinated 2 series, no booster. No sick contacts. No hx asthma. Quit smoking in 2019 after she had a stroke.  -Snores, unaware if gasping or choking. Sometimes wakes with morning headaches. Also has acid reflux. PT for knees had held- due to concern for DVT but [place] reviewed and negative for DVT. -Echo reviewed as well showing EF 61% with mildly increased wall thickness, diastolic impairment and left atrium borderline enlarged. Will move forward with sleep study.  EPWORTH SLEEPINESS SCALE:  Scale:  (0)= no chance of dozing; (1)= slight chance of dozing; (2)= moderate chance of dozing; (3)= high chance of dozing  Chance  Situtation    Sitting and reading: 3    Watching TV: 2    Sitting Inactive in public: 0    As a passenger in car: 3      Lying down to rest: 3    Sitting and talking: 0    Sitting quielty after lunch: 3    In a car, stopped in traffic: 0   TOTAL SCORE:   14 out of 24   Current Medication:  Outpatient Encounter Medications as of 02/07/2021  Medication Sig  . aspirin EC 325 MG tablet Take 1 tablet (325 mg total) by mouth daily.  Marland Kitchen atorvastatin (LIPITOR) 10 MG tablet Take 1 tablet (10 mg total) by mouth daily.  . benzonatate (TESSALON) 100 MG capsule Take 1 capsule (100 mg total) by mouth 2 (two) times daily as needed for cough.  . bisoprolol-hydrochlorothiazide (ZIAC) 2.5-6.25 MG tablet TAKE 1 TABLET BY MOUTH EVERY DAY IN THE MORNING FOR  BLOOD PRESSURE  . cetirizine (ZYRTEC) 10 MG tablet Take 10 mg by mouth daily as needed for allergies.   Marland Kitchen ibuprofen (ADVIL) 600 MG tablet Take 1 tablet (600 mg total) by mouth every 8 (eight) hours as needed.  . [DISCONTINUED] PROAIR HFA 108 (90 Base) MCG/ACT inhaler TAKE 2 PUFFS BY MOUTH EVERY 6 HOURS AS NEEDED FOR WHEEZE OR SHORTNESS OF BREATH  . albuterol (PROAIR HFA) 108 (90 Base) MCG/ACT inhaler TAKE 2 PUFFS BY MOUTH EVERY 6 HOURS AS NEEDED FOR WHEEZE OR SHORTNESS OF BREATH   Facility-Administered Encounter Medications as of 02/07/2021  Medication  . cyanocobalamin ((VITAMIN B-12)) injection 1,000 mcg      Medical History: Past Medical History:  Diagnosis Date  . Hemoglobin E-A disorder (HCC)   . Hyperlipidemia   . Hypertension   . Migraine   . Morbid obesity (HCC)   . Stroke [facility])      Vital Signs: BP 108/84   Pulse 75   Temp 98.5 F (36.9 C)   Resp 16   Ht 5' (1.524 m)   Wt 228 lb 12.8 oz (103.8 kg)   SpO2 93%   BMI 44.68 kg/m    Review of Systems  Constitutional: Positive for fatigue. Negative for fever.  HENT: Positive for congestion and sinus pressure. Negative for mouth sores and postnasal drip.  Respiratory: Positive for cough and wheezing. Negative for shortness of breath.   Cardiovascular: Negative for chest pain.  Genitourinary: Negative for flank pain.  Psychiatric/Behavioral: Positive for sleep disturbance.    Physical Exam Vitals and nursing note reviewed.  Constitutional:      General: She is not in acute distress.    Appearance: She is well-developed. She is obese. She is not diaphoretic.  HENT:     Head: Normocephalic and atraumatic.     Mouth/Throat:     Pharynx: No oropharyngeal exudate.  Eyes:     Pupils: Pupils are equal, round, and reactive to light.  Neck:     Thyroid: No thyromegaly.     Vascular: No JVD.     Trachea: No tracheal deviation.  Cardiovascular:     Rate and Rhythm: Normal rate and regular rhythm.     Heart  sounds: Normal heart sounds. No murmur heard. No friction rub. No gallop.   Pulmonary:     Effort: Pulmonary effort is normal. No respiratory distress.     Breath sounds: No wheezing or rales.  Chest:     Chest wall: No tenderness.  Abdominal:     General: Bowel sounds are normal.     Palpations: Abdomen is soft.  Musculoskeletal:        General: Normal range of motion.     Cervical back: Normal range of motion and neck supple.     Right lower leg: No edema.     Left lower leg: No edema.  Lymphadenopathy:     Cervical: No cervical adenopathy.  Skin:    General: Skin is warm and dry.  Neurological:     Mental Status: She is alert and oriented to person, place, and time.     Cranial Nerves: No cranial nerve deficit.  Psychiatric:        Behavior: Behavior normal.        Thought Content: Thought content normal.        Judgment: Judgment normal.       Assessment/Plan: 1. Upper respiratory tract infection, unspecified type Continue Z-Pak, Mucinex, nasal sprays.  Patient may also use albuterol for any wheezing or shortness of breath and Tessalon Perles for cough.  Educated to stay well-hydrated and get rest - albuterol (PROAIR HFA) 108 (90 Base) MCG/ACT inhaler; TAKE 2 PUFFS BY MOUTH EVERY 6 HOURS AS NEEDED FOR WHEEZE OR SHORTNESS OF BREATH  Dispense: 8.5 each; Refill: 1 - benzonatate (TESSALON) 100 MG capsule; Take 1 capsule (100 mg total) by mouth 2 (two) times daily as needed for cough.  Dispense: 20 capsule; Refill: 0  2. Sleep disturbance Based on history of CVA, elevated BMI, daytime sleepiness, and echo results showing left atrial enlargement and wall thickening we will order a sleep study - PSG SLEEP STUDY  3. Mild atrial enlargement, left Found on recent echo we will order sleep study - PSG SLEEP STUDY  4. History of CVA (cerebrovascular accident) Etiology never identified, patient not on any blood thinners and no history of any abnormal heart rhythms that she is  aware of, will order sleep study for further evaluation  5. Morbid obesity with BMI of 40.0-44.9, adult (HCC) We will order sleep study Obesity Counseling: Had a lengthy discussion regarding patients BMI and weight issues. Patient was instructed on portion control as well as increased activity. Also discussed caloric restrictions with trying to maintain intake less than 2000 Kcal. Discussions were made in accordance with the 5As of weight management. Simple  actions such as not eating late and if able to, taking a walk is suggested.    General Counseling: suhailah kwan understanding of the findings of todays visit and agrees with plan of treatment. I have discussed any further diagnostic evaluation that may be needed or ordered today. We also reviewed her medications today. she has been encouraged to call the office with any questions or concerns that should arise related to todays visit.    Counseling:    Orders Placed This Encounter  Procedures  . PSG SLEEP STUDY    Meds ordered this encounter  Medications  . albuterol (PROAIR HFA) 108 (90 Base) MCG/ACT inhaler    Sig: TAKE 2 PUFFS BY MOUTH EVERY 6 HOURS AS NEEDED FOR WHEEZE OR SHORTNESS OF BREATH    Dispense:  8.5 each    Refill:  1  . benzonatate (TESSALON) 100 MG capsule    Sig: Take 1 capsule (100 mg total) by mouth 2 (two) times daily as needed for cough.    Dispense:  20 capsule    Refill:  0    Time spent:40 Minutes

## 2021-02-08 ENCOUNTER — Ambulatory Visit: Payer: Medicaid Other | Admitting: Nurse Practitioner

## 2021-02-25 ENCOUNTER — Telehealth: Payer: Self-pay

## 2021-02-26 NOTE — Telephone Encounter (Signed)
FG stated: We have this patient scheduled on Monday March 11, 2021 for a PSG study.

## 2021-03-05 ENCOUNTER — Ambulatory Visit: Payer: Medicaid Other | Admitting: Hospice and Palliative Medicine

## 2021-03-11 ENCOUNTER — Encounter (INDEPENDENT_AMBULATORY_CARE_PROVIDER_SITE_OTHER): Payer: Medicaid Other | Admitting: Internal Medicine

## 2021-03-11 DIAGNOSIS — G4733 Obstructive sleep apnea (adult) (pediatric): Secondary | ICD-10-CM | POA: Diagnosis not present

## 2021-03-11 DIAGNOSIS — G4719 Other hypersomnia: Secondary | ICD-10-CM

## 2021-03-15 DIAGNOSIS — G4733 Obstructive sleep apnea (adult) (pediatric): Secondary | ICD-10-CM | POA: Insufficient documentation

## 2021-03-15 DIAGNOSIS — G4719 Other hypersomnia: Secondary | ICD-10-CM | POA: Insufficient documentation

## 2021-03-15 NOTE — Procedures (Signed)
[facility]  Polysomnogram Report Part I                                                                 Phone: [phone] Fax: [phone]  Patient Name: Natasha Moreno, Natasha Moreno Acquisition Number: [id number]  Date of Birth: 06/04/1981 Acquisition Date: 03/11/2021  Referring Physician: Lynn Ito, PA-C     History: The patient is a 40 year old female who was referred for evaluation of possible sleep apnea. Medical History: hyperlipidemia, hypertension, migraine, morbid obesity.  Medications: aspirin, atorvastatin, benzonate, bisoprolol-hydrochlorothiazide, cetirizine, ibuprofen.  Procedure: This routine overnight polysomnogram was performed on the Alice 5 using the standard diagnostic protocol. This included 6 channels of EEG, 2 channels of EOG, chin EMG, bilateral anterior tibialis EMG, nasal/oral thermistor, PTAF (nasal pressure transducer), chest and abdominal wall movements, EKG, and pulse oximetry.  Description: The total recording time was 396.5 minutes. The total sleep time was 295.0 minutes. There were a total of 50.0 minutes of wakefulness after sleep onset for a reducedsleep efficiency of 74.4%. The latency to sleep onset was prolonged at 51.5 minutes. The R sleep onset latency was within normal limits at 64.5 minutes. Sleep parameters, as a percentage of the total sleep time, demonstrated 4.1% of sleep was in N1 sleep, 66.3% N2, 16.9% N3 and 12.7% R sleep. There were a total of 78 arousals for an arousal index of 15.9 arousals per hour of sleep that was slightly elevated.  Respiratory monitoring demonstrated occasional mild degree of snoring in all positions. There were 36 apneas and hypopneas for an Apnea Hypopnea Index of 7.3 apneas and hypopneas per hour of sleep. The REM related apnea hypopnea index was 20.8/hr of REM sleep compared to a NREM AHI of 5.4/hr.  The average duration of the respiratory events was 26.3 seconds with a maximum duration of 43.5 seconds. The  respiratory events occurred in all positions, however they were more frequent in the supine position with an AHI of 11.9. The respiratory events were associated with peripheral oxygen desaturations on the average to 90%. The lowest oxygen desaturation associated with a respiratory event was 80%. Additionally, the baseline oxygen saturation during wakefulness was 96%, during NREM sleep averaged 95%, and during REM sleep averaged  95%. The total duration of oxygen < 90% was 2.0 minutes.  Cardiac monitoring- did not demonstrate transient cardiac decelerations associated with the apneas. There were no significant cardiac rhythm irregularities.   Periodic limb movement monitoring- demonstrated that there were 27 periodic limb movements for a periodic limb movement index of 5.5 periodic limb movements per hour of sleep.     Impression: This routine overnight polysomnogram demonstrated significant obstructive sleep apnea with an overall Apnea Hypopnea Index of 7.3 apneas and hypopneas per hour of sleep. The respiratory events were more frequent in REM and in supine sleep with AHIs of 20.8 and 11.9, respectively. As REM percentage was reduced, the findings likely underestimate the severity of the sleep apnea.  There were few periodic limb movements that commonly are not significant. Clinical correlation would be suggested.   There was a reduced sleep efficiency with a slightly elevated arousal index,increased awakeningsand a reduced REM percentage.These findings would appear to be due to the combination of obstructive sleep apnea and psychophysiological  factors such as first night effect.  Recommendations:    A CPAP titration would be recommended for the sleep apnea.  Would recommend weight loss in a patient with a BMI of 44.1.  Alternative treatment options may include an oral appliance, a nasal resistance device, or ENT surgery in the appropriate clinical context.     Yevonne Pax, MD,  [facility] Diplomate ABMS-Pulmonary, Critical Care and Sleep Medicine  Electronically reviewed and digitally signed     [facility] Polysomnogram Report Part II  Phone: [phone] Fax: [phone]  Patient last name [name] Neck Size 14.5 in. Acquisition [phone]  Patient first name Natasha Moreno Weight 226.0 lbs. Started 03/11/2021 at 10:47:35 PM  Birth date 1981/02/28 Height 60.0 in. Stopped 03/12/2021 at 5:28:23 AM  Age 69 BMI 44.1 lb/in2 Duration 396.5  Study Type Adult      Report generated by: Hampton Abbot, RPSGT Sleep Data: Lights Out: 10:51:35 PM Sleep Onset: 11:43:05 PM  Lights On: 5:28:05 AM Sleep Efficiency: 74.4 %  Total Recording Time: 396.5 min Sleep Latency (from Lights Off) 51.5 min  Total Sleep Time (TST): 295.0 min R Latency (from Sleep Onset): 64.5 min  Sleep Period Time: 345.0 min Total number of awakenings: 22  Wake during sleep: 50.0 min Wake After Sleep Onset (WASO): 50.0 min   Sleep Data:         Arousal Summary: Stage  Latency from lights out (min) Latency from sleep onset (min) Duration (min) % Total Sleep Time  Normal values  N 1 51.5 0.0 12.0 4.1 (5%)  N 2 53.0 1.5 195.5 66.3 (50%)  N 3 74.5 23.0 50.0 16.9 (20%)  R 116.0 64.5 37.5 12.7 (25%)    Number Index  Spontaneous 33 6.7  Apneas & Hypopneas 19 3.9  RERAs 0 0.0       (Apneas & Hypopneas & RERAs)  (19) (3.9)  Limb Movement 39 7.9  Snore 0 0.0  TOTAL 91 18.5     Respiratory Data:  CA OA MA Apnea Hypopnea* A+ H RERA Total  Number 0 1 0 1 35 36 0 36  Mean Dur (sec) 0.0 12.0 0.0 12.0 26.8 26.3 0.0 26.3  Max Dur (sec) 0.0 12.0 0.0 12.0 43.5 43.5 0.0 43.5  Total Dur (min) 0.0 0.2 0.0 0.2 15.6 15.8 0.0 15.8  % of TST 0.0 0.1 0.0 0.1 5.3 5.4 0.0 5.4  Index (#/h TST) 0.0 0.2 0.0 0.2 7.1 7.3 0.0 7.3  *Hypopneas scored based on 4% or greater desaturation.  Sleep Stage:        REM NREM TST  AHI 20.8 5.4 7.3  RDI 20.8 5.4 7.3           Body Position Data:  Sleep (min)  TST (%) REM (min) NREM (min) CA (#) OA (#) MA (#) HYP (#) AHI (#/h) RERA (#) RDI (#/h) Desat (#)  Supine 95.5 32.37 8.0 87.5 0 1 0 18 11.9 0 11.9 22  Non-Supine 199.50 67.63 29.50 170.00 0.00 0.00 0.00 17.00 5.11 0 5.11 21.00  Left: 108.5 36.78 29.5 79.0 0 0 0 15 8.3 0 8.3 19  Prone: 64.1 21.73 0.0 64.1 0 0 0 1 0.9 0 0.9 1  Right: 26.9 9.12 0.0 26.9 0 0 0 1 2.2 0 2.2 1     Snoring: Total number of snoring episodes  0  Total time with snoring    min (   % of sleep)   Oximetry Distribution:  WK REM NREM TOTAL  Average (%)   96 95 95 95  < 90% 0.3 1.2 0.5 2.0  < 80% 0.2 0.0 0.0 0.2  < 70% 0.2 0.0 0.0 0.2  # of Desaturations* 3 15 25  43  Desat Index (#/hour) 1.9 24.0 5.8 8.7  Desat Max (%) 5 16 12 16   Desat Max Dur (sec) 76.0 53.0 63.0 76.0  Approx Min O2 during sleep 80  Approx min O2 during a respiratory event 80  Was Oxygen added (Y/N) and final rate No:   0 LPM  *Desaturations based on 4% or greater drop from baseline.   Cheyne Stokes Breathing: None Present   Heart Rate Summary:  Average Heart Rate During Sleep 67.3 bpm      Highest Heart Rate During Sleep (95th %) 75.0 bpm      Highest Heart Rate During Sleep 138 bpm      Highest Heart Rate During Recording (TIB) 165 bpm (artifact)   Heart Rate Observations: Event Type # Events   Bradycardia 0 Lowest HR Scored: N/A  Sinus Tachycardia During Sleep 0 Highest HR Scored: N/A  Narrow Complex Tachycardia 0 Highest HR Scored: N/A  Wide Complex Tachycardia 0 Highest HR Scored: N/A  Asystole 0 Longest Pause: N/A  Atrial Fibrillation 0 Duration Longest Event: N/A  Other Arrythmias  No Type:    Periodic Limb Movement Data: (Primary legs unless otherwise noted) Total # Limb Movement 73 Limb Movement Index 14.8  Total # PLMS 27 PLMS Index 5.5  Total # PLMS Arousals 11 PLMS Arousal Index 2.2  Percentage Sleep Time with PLMS 17.35min (5.8 % sleep)  Mean Duration limb movements (secs) 255.8

## 2021-04-03 ENCOUNTER — Encounter (INDEPENDENT_AMBULATORY_CARE_PROVIDER_SITE_OTHER): Payer: Medicaid Other | Admitting: Internal Medicine

## 2021-04-03 DIAGNOSIS — G4733 Obstructive sleep apnea (adult) (pediatric): Secondary | ICD-10-CM | POA: Diagnosis not present

## 2021-04-05 NOTE — Procedures (Signed)
[facility]  Polysomnogram Report Part I  Phone: [phone] Fax: [phone]  Patient Name: Natasha Moreno, Natasha Moreno Acquisition Number: [id number]  Date of Birth: Apr 21, 1981 Acquisition Date: 04/03/2021  Referring Physician: Lynn Ito, PA     History: The patient is a 40 year old female with obstructive sleep apnea for CPAP titration. Medical History: hyperlipidemia, hypertension, migraines, morbid obesity.  Medications: aspirin, atorvastatin, benzonate, bisoprolol-hydrochlorothiazide, cetirizine, ibuprofen.  Procedure: This routine overnight polysomnogram was performed on the Alice 5 using the standard CPAP  protocol. This included 6 channels of EEG, 2 channels of EOG, chin EMG, bilateral anterior tibialis EMG, nasal/oral thermistor, PTAF (nasal pressure transducer), chest and abdominal wall movements, EKG, and pulse oximetry.  Description: The total recording time was 400.8 minutes. The total sleep time was 306.3 minutes. There were a total of 56.5 minutes of wakefulness after sleep onset for a reducedsleep efficiency of 76.4%. The latency to sleep onset was prolonged at 38.0 minutes. The R sleep onset latency was within normal limits at 65.5 minutes. Sleep parameters, as a percentage of the total sleep time, demonstrated 2.6% of sleep was in N1 sleep, 63.1% N2, 19.1% N3 and 15.2% R sleep. There were a total of 18 arousals for an arousal index of 3.5 arousals per hour of sleep that was normal.  Overall, there were a total of 9 respiratory events for a respiratory disturbance index, which includes apneas, hypopneas and RERAs (increased respiratory effort) of 1.8 respiratory events per hour of sleep during the pressure titration. CPAP was initiated at 4 cm H2O at lights out, 10:19 p.m. It was titrated in 1 cm increments for occasional hypopneas  to the final pressure of  8 cm H2O.   Additionally, the baseline oxygen saturation during wakefulness was 96%, during NREM sleep  averaged 95%, and during REM sleep averaged 96%. The total duration of oxygen < 90% was 0.3 minutes.  Cardiac monitoring- There were no significant cardiac rhythm irregularities.   Periodic limb movement monitoring- did not demonstrate periodic limb movements. Quasi-periodic  limb movements were observed prior to sleep onset.  Impression: This patient's obstructive sleep apnea demonstrated significant improvement with the utilization of nasal CPAP at 8 cm H2O.   Quasi-periodic  limb movements were observed prior to sleep onset. Sometimes these limb movements appear transiently with the initiation of CPAP.  Recommendations: Would recommend utilization of nasal CPAP at 8 cm H2O.      A [employer] Simplus mask, size small, was used. Chin strap used during study- no. Humidifier used during study- yes.     Yevonne Pax, MD, [facility] Diplomate ABMS-Pulmonary, Critical Care and Sleep Medicine  Electronically reviewed and digitally signed     [facility] CPAP/BIPAP Polysomnogram Report Part II Phone: [phone] Fax: [phone]  Patient last name Moreno Neck Size 14.5 in. Acquisition [phone]  Patient first name Natasha Weight 226.0 lbs. Started 04/03/2021 at 10:10:29 PM  Birth date 1981/01/20 Height 60.0 in. Stopped 04/04/2021 at 5:01:17 AM  Age 40      Type Adult BMI 44.1 lb/in2 Duration 400.8  Report generated by: Hampton Abbot, RPSGT Sleep Data: Lights Out: 10:19:59 PM Sleep Onset: 10:57:59 PM  Lights On: 5:00:47 AM Sleep Efficiency: 76.4 %  Total Recording Time: 400.8 min Sleep Latency (from Lights Off) 38.0 min  Total Sleep Time (TST): 306.3 min R Latency (from Sleep Onset): 65.5 min  Sleep Period Time: 362.8 min Total number of awakenings: 12  Wake  during sleep: 56.5 min Wake After Sleep Onset (WASO): 56.5 min   Sleep Data:         Arousal Summary: Stage  Latency from lights out (min) Latency from sleep onset (min) Duration (min) % Total Sleep Time  Normal  values  N 1 38.0 0.0 8.0 2.6 (5%)  N 2 38.5 0.5 193.3 63.1 (50%)  N 3 66.0 28.0 58.5 19.1 (20%)  R 103.5 65.5 46.5 15.2 (25%)    Number Index  Spontaneous 24 4.7  Apneas & Hypopneas 2 0.4  RERAs 0 0.0       (Apneas & Hypopneas & RERAs)  (2) (0.4)  Limb Movement 5 1.0  Snore 0 0.0  TOTAL 31 6.1     Respiratory Data:  CA OA MA Apnea Hypopnea* A+ H RERA Total  Number 0 0 0 0 9 9 0 9  Mean Dur (sec) 0.0 0.0 0.0 0.0 28.2 28.2 0.0 28.2  Max Dur (sec) 0.0 0.0 0.0 0.0 60.0 60.0 0.0 60.0  Total Dur (min) 0.0 0.0 0.0 0.0 4.2 4.2 0.0 4.2  % of TST 0.0 0.0 0.0 0.0 1.4 1.4 0.0 1.4  Index (#/h TST) 0.0 0.0 0.0 0.0 1.8 1.8 0.0 1.8  *Hypopneas scored based on 4% or greater desaturation.  Sleep Stage:         REM NREM TST  AHI 3.9 1.4 1.8  RDI 3.9 1.4 1.8    Sleep (min) TST (%) REM (min) NREM (min) CA (#) OA (#) MA (#) HYP (#) AHI (#/h) RERA (#) RDI (#/h) Desat (#)  Supine 164.9 53.84 30.0 134.9 0 0 0 4 1.5 0 1.5 15  Non-Supine 141.40 46.16 16.50 124.90 0.00 0.00 0.00 5.00 2.12 0 2.12 11.00  Left: 141.4 45.90 16.5 124.1 0 0 0 5 2.1 0 2.1 11  Prone: 0.0 0.00 0.0 0.0 0 0 0 0 0.0 0 0.00 0     Snoring: Total number of snoring episodes  0  Total time with snoring    min (   % of sleep)   Oximetry Distribution:             WK REM NREM TOTAL  Average (%)   96 96 95 96  < 90% 0.3 0.0 0.0 0.3  < 80% 0.0 0.0 0.0 0.0  < 70% 0.0 0.0 0.0 0.0  # of Desaturations* 2 6 18 26   Desat Index (#/hour) 1.3 7.7 4.2 5.1  Desat Max (%) 8 5 11 11   Desat Max Dur (sec) 18.0 69.0 107.0 107.0  Approx Min O2 during sleep 88  Approx min O2 during a respiratory event 92  Was Oxygen added (Y/N) and final rate No:   0 LPM  *Desaturations based on 3% or greater drop from baseline.   Cheyne Stokes Breathing: None Present    Heart Rate Summary:  Average Heart Rate During Sleep 71.1 bpm      Highest Heart Rate During Sleep (95th %) 79.0 bpm      Highest Heart Rate During Sleep 168 bpm  (artifact)  Highest Heart Rate During Recording (TIB) 193 bpm (artifact)   Heart Rate Observations: Event Type # Events   Bradycardia 0 Lowest HR Scored: N/A  Sinus Tachycardia During Sleep 0 Highest HR Scored: N/A  Narrow Complex Tachycardia 0 Highest HR Scored: N/A  Wide Complex Tachycardia 0 Highest HR Scored: N/A  Asystole 0 Longest Pause: N/A  Atrial Fibrillation 0 Duration Longest Event: N/A  Other Arrythmias  No Type:  Periodic Limb Movement Data: (Primary legs unless otherwise noted) Total # Limb Movement 11 Limb Movement Index 2.2  Total # PLMS    PLMS Index     Total # PLMS Arousals    PLMS Arousal Index     Percentage Sleep Time with PLMS   min (   % sleep)  Mean Duration limb movements (secs)       IPAP Level (cmH2O) EPAP Level (cmH2O) Total Duration (min) Sleep Duration (min) Sleep (%) REM (%) CA  #) OA # MA # HYP #) AHI (#/hr) RERAs # RERAs (#/hr) RDI (#/hr)  5 5 127.3 115.8 91.0 13.0 0 0 0 4 2.1 0 0.0 2.1  6 6  74.0 31.0 41.9 0.0 0 0 0 1 1.9 0 0.0 1.9  7 7  10.1 8.6 85.1 0.0 0 0 0 3 20.9 0 0.0 20.9  8 8  150.4 149.9 99.7 19.9 0 0 0 1 0.4 0 0.0 0.4

## 2021-06-07 ENCOUNTER — Ambulatory Visit: Payer: Medicaid Other | Admitting: Nurse Practitioner

## 2021-06-20 ENCOUNTER — Other Ambulatory Visit: Payer: Self-pay

## 2021-06-20 ENCOUNTER — Ambulatory Visit: Payer: Medicaid Other | Admitting: Nurse Practitioner

## 2021-06-20 ENCOUNTER — Encounter: Payer: Self-pay | Admitting: Nurse Practitioner

## 2021-06-20 VITALS — BP 120/86 | HR 90 | Temp 98.7°F | Resp 16 | Ht 60.0 in | Wt 245.4 lb

## 2021-06-20 DIAGNOSIS — D638 Anemia in other chronic diseases classified elsewhere: Secondary | ICD-10-CM | POA: Diagnosis not present

## 2021-06-20 DIAGNOSIS — R7301 Impaired fasting glucose: Secondary | ICD-10-CM

## 2021-06-20 DIAGNOSIS — G4733 Obstructive sleep apnea (adult) (pediatric): Secondary | ICD-10-CM

## 2021-06-20 DIAGNOSIS — Z23 Encounter for immunization: Secondary | ICD-10-CM | POA: Diagnosis not present

## 2021-06-20 DIAGNOSIS — E559 Vitamin D deficiency, unspecified: Secondary | ICD-10-CM

## 2021-06-20 LAB — POCT GLYCOSYLATED HEMOGLOBIN (HGB A1C): Hemoglobin A1C: 5.5 % (ref 4.0–5.6)

## 2021-06-20 MED ORDER — HYDROCHLOROTHIAZIDE 12.5 MG PO TABS: 12.5000 mg | ORAL_TABLET | Freq: Every day | ORAL | 0 refills | Status: DC

## 2021-06-20 NOTE — Progress Notes (Signed)
[facility] [address] [place], [place] [address]  Internal MEDICINE  Office Visit Note  Patient Name: Natasha Moreno  [id number]  [id number]  Date of Service: 07/07/2021  Chief Complaint  Patient presents with   Follow-up    Review cpap titration   Hyperlipidemia   Hypertension   Weight Loss    HPI Natasha Moreno presents for a follow up visit to discuss sleep study/cpap titration results. She qualifies for cpap so the order will be placed. Weight loss is also recommended. She has also had a metabolic test so she understands what her daily calorie intake should be. Please see below for sleep study, cpap titration and metabolic test results.   Initial sleep study: Impression: This routine overnight polysomnogram demonstrated significant obstructive sleep apnea with an overall Apnea Hypopnea Index of 7.3 apneas and hypopneas per hour of sleep. The respiratory events were more frequent in REM and in supine sleep with AHIs of 20.8 and 11.9, respectively. As REM percentage was reduced, the findings likely underestimate the severity of the sleep apnea.  --There were few periodic limb movements that commonly are not significant. Clinical correlation would be suggested.   --There was a reduced sleep efficiency with a slightly elevated arousal index,increased awakeningsand a reduced REM percentage.These findings would appear to be due to the combination of obstructive sleep apnea and psychophysiological factors such as first night effect.  Recommendations:    A CPAP titration would be recommended for the sleep apnea.  Would recommend weight loss in a patient with a BMI of 44.1.  Alternative treatment options may include an oral appliance, a nasal resistance device, or ENT surgery in the appropriate clinical context.   CPAP titration: Impression: This patient's obstructive sleep apnea demonstrated significant improvement with the utilization of nasal CPAP at 8 cm H2O.    --Quasi-periodic  limb movements were observed prior to sleep onset. Sometimes these limb movements appear transiently with the initiation of CPAP.  Recommendations: Would recommend utilization of nasal CPAP at 8 cm H2O.      A [employer] Simplus mask, size small, was used. Chin strap used during study- no. Humidifier used during study- yes.   Metabolic test results: Resting Energy Expenditure: 1944,  Estimated Total Energy Output: 2728,  Target Caloric Intake Per Day for Weight Loss: 1556,  When Compared to Normals Metabolism is considered fast to normal.  Current Medication: Outpatient Encounter Medications as of 06/20/2021  Medication Sig   albuterol (PROAIR HFA) 108 (90 Base) MCG/ACT inhaler TAKE 2 PUFFS BY MOUTH EVERY 6 HOURS AS NEEDED FOR WHEEZE OR SHORTNESS OF BREATH   aspirin EC 325 MG tablet Take 1 tablet (325 mg total) by mouth daily.   atorvastatin (LIPITOR) 10 MG tablet Take 1 tablet (10 mg total) by mouth daily.   benzonatate (TESSALON) 100 MG capsule Take 1 capsule (100 mg total) by mouth 2 (two) times daily as needed for cough.   bisoprolol-hydrochlorothiazide (ZIAC) 2.5-6.25 MG tablet TAKE 1 TABLET BY MOUTH EVERY DAY IN THE MORNING FOR BLOOD PRESSURE   cetirizine (ZYRTEC) 10 MG tablet Take 10 mg by mouth daily as needed for allergies.    hydrochlorothiazide (HYDRODIURIL) 12.5 MG tablet Take 1 tablet (12.5 mg total) by mouth daily.   ibuprofen (ADVIL) 600 MG tablet Take 1 tablet (600 mg total) by mouth every 8 (eight) hours as needed.   Facility-Administered Encounter Medications as of 06/20/2021  Medication   cyanocobalamin ((VITAMIN B-12)) injection 1,000 mcg    Surgical History:  Past Surgical History:  Procedure Laterality Date   CHOLECYSTECTOMY     TEE WITHOUT CARDIOVERSION N/A 11/11/2017   Procedure: TRANSESOPHAGEAL ECHOCARDIOGRAM (TEE);  Surgeon: Dalia Heading, MD;  Location: [facility] ORS;  Service: Cardiovascular;  Laterality: N/A;   TUBAL LIGATION     WISDOM  TOOTH EXTRACTION      Medical History: Past Medical History:  Diagnosis Date   Hemoglobin E-A disorder (HCC)    Hyperlipidemia    Hypertension    Migraine    Morbid obesity (HCC)    Stroke (HCC)     Family History: Family History  Problem Relation Age of Onset   Kidney failure Father    Heart attack Father        open heart surgery   Hypertension Father    Diabetes Father    Gout Father    Cataracts Father     Social History   Socioeconomic History   Marital status: Single    Spouse name: Not on file   Number of children: 2   Years of education: Not on file   Highest education level: High school graduate  Occupational History   Not on file  Tobacco Use   Smoking status: Former    Packs/day: 0.25    Types: Cigarettes    Quit date: 11/09/2017    Years since quitting: 3.6   Smokeless tobacco: Never   Tobacco comments:    approximately 4 cigarettes a day  Vaping Use   Vaping Use: Former  Substance and Sexual Activity   Alcohol use: Not Currently    Comment: every other weekend, not much    Drug use: No   Sexual activity: Yes    Birth control/protection: Condom  Other Topics Concern   Not on file  Social History Narrative   Lives at home with her parents, brother and her children   Right handed   Drinks rare caffeine    Social Determinants of Health   Financial Resource Strain: Not on file  Food Insecurity: Not on file  Transportation Needs: Not on file  Physical Activity: Not on file  Stress: Not on file  Social Connections: Not on file  Intimate Partner Violence: Not on file      Review of Systems  Constitutional:  Negative for chills, fatigue and unexpected weight change.  HENT:  Negative for congestion, rhinorrhea, sneezing and sore throat.   Eyes:  Negative for redness.  Respiratory:  Negative for cough, chest tightness and shortness of breath.   Cardiovascular:  Negative for chest pain and palpitations.  Gastrointestinal:  Negative for  abdominal pain, constipation, diarrhea, nausea and vomiting.  Genitourinary:  Negative for dysuria and frequency.  Musculoskeletal:  Negative for arthralgias, back pain, joint swelling and neck pain.  Skin:  Negative for rash.  Neurological: Negative.  Negative for tremors and numbness.  Hematological:  Negative for adenopathy. Does not bruise/bleed easily.  Psychiatric/Behavioral:  Negative for behavioral problems (Depression), sleep disturbance and suicidal ideas. The patient is not nervous/anxious.    Vital Signs: BP 120/86   Pulse 90   Temp 98.7 F (37.1 C)   Resp 16   Ht 5' (1.524 m)   Wt 245 lb 6.4 oz (111.3 kg)   SpO2 98%   BMI 47.93 kg/m    Physical Exam Vitals reviewed.  Constitutional:      General: She is not in acute distress.    Appearance: Normal appearance. She is obese. She is not ill-appearing.  HENT:  Head: Normocephalic and atraumatic.  Eyes:     Extraocular Movements: Extraocular movements intact.     Pupils: Pupils are equal, round, and reactive to light.  Cardiovascular:     Rate and Rhythm: Normal rate and regular rhythm.  Pulmonary:     Effort: Pulmonary effort is normal. No respiratory distress.  Neurological:     Mental Status: She is alert and oriented to person, place, and time.     Cranial Nerves: No cranial nerve deficit.     Coordination: Coordination normal.     Gait: Gait normal.  Psychiatric:        Mood and Affect: Mood normal.        Behavior: Behavior normal.       Assessment/Plan: 1. OSA (obstructive sleep apnea) Cpap ordered, will need follow up with kelly.  - For home use only DME continuous positive airway pressure (CPAP)  2. Anemia in other chronic diseases classified elsewhere Routine labs ordered - Comprehensive Metabolic Panel (CMET) - B12 and Folate Panel  3. Impaired fasting glucose A1c is normal at 5.5 - POCT glycosylated hemoglobin (Hb A1C)  4. Vitamin D deficiency Low vitamin D 3 years ago but has not  been checked since, repeat lab ordered.  - Vitamin D (25 hydroxy)  5. Needs flu shot Administered in office today - Flu Vaccine MDCK QUAD PF   General Counseling: Natasha Moreno verbalizes understanding of the findings of todays visit and agrees with plan of treatment. I have discussed any further diagnostic evaluation that may be needed or ordered today. We also reviewed her medications today. she has been encouraged to call the office with any questions or concerns that should arise related to todays visit.    Orders Placed This Encounter  Procedures   For home use only DME continuous positive airway pressure (CPAP)   Flu Vaccine MDCK QUAD PF   Comprehensive Metabolic Panel (CMET)   B12 and Folate Panel   Vitamin D (25 hydroxy)   POCT glycosylated hemoglobin (Hb A1C)    Meds ordered this encounter  Medications   hydrochlorothiazide (HYDRODIURIL) 12.5 MG tablet    Sig: Take 1 tablet (12.5 mg total) by mouth daily.    Dispense:  30 tablet    Refill:  0    Return in about 3 months (around 09/19/2021) for F/U, pulmonary/sleep needs appt with kelly after she gets her cpap. .   Total time spent:30 Minutes Time spent includes review of chart, medications, test results, and follow up plan with the patient.   [place] Controlled Substance Database was reviewed by me.  This patient was seen by Sallyanne Kuster, FNP-C in collaboration with Dr. Beverely Risen as a part of collaborative care agreement.   Natasha Pavek R. Tedd Sias, MSN, FNP-C Internal medicine

## 2021-06-26 IMAGING — CR DG KNEE 1-2V*L*
1 series · 2 of 2 positions shown · non-contrast
Comparison: None.

CLINICAL DATA: Left knee pain

EXAM:
LEFT KNEE - 1-2 VIEW

[Series 1: dg knee 1-2 views left · 0.14mm/px · 2 of 2 slices shown]
[im 1/2]
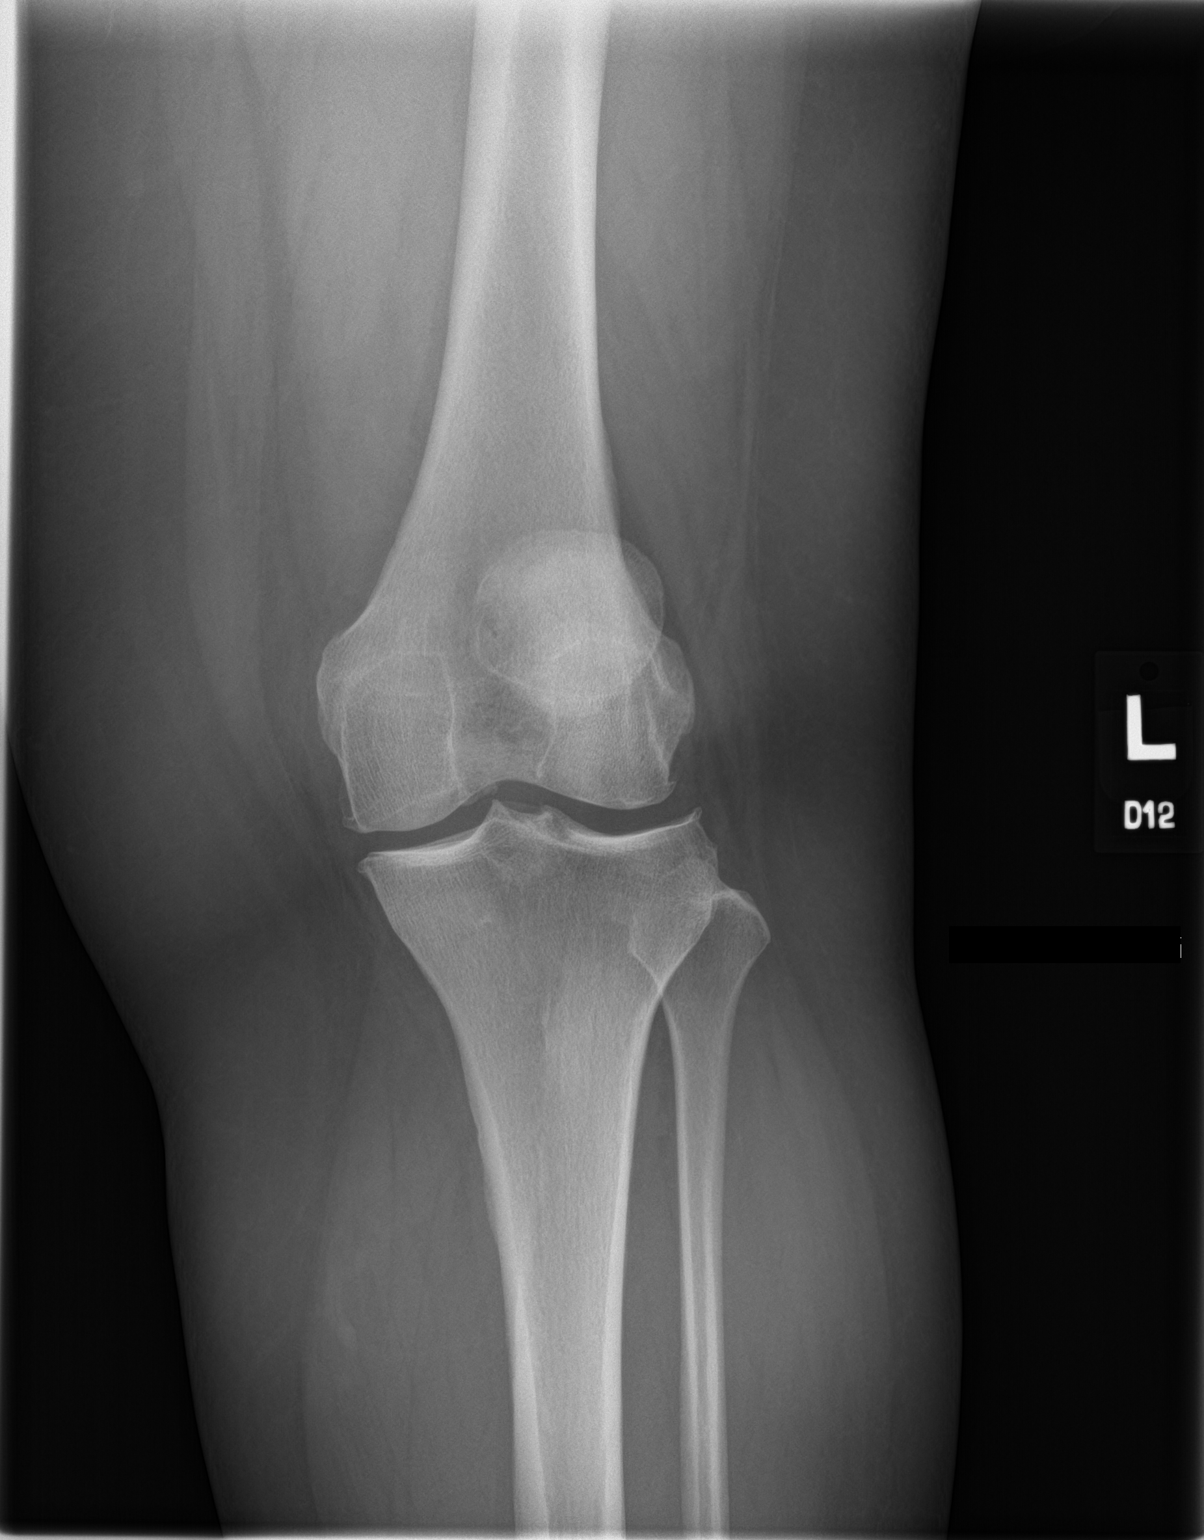
[im 2/2]
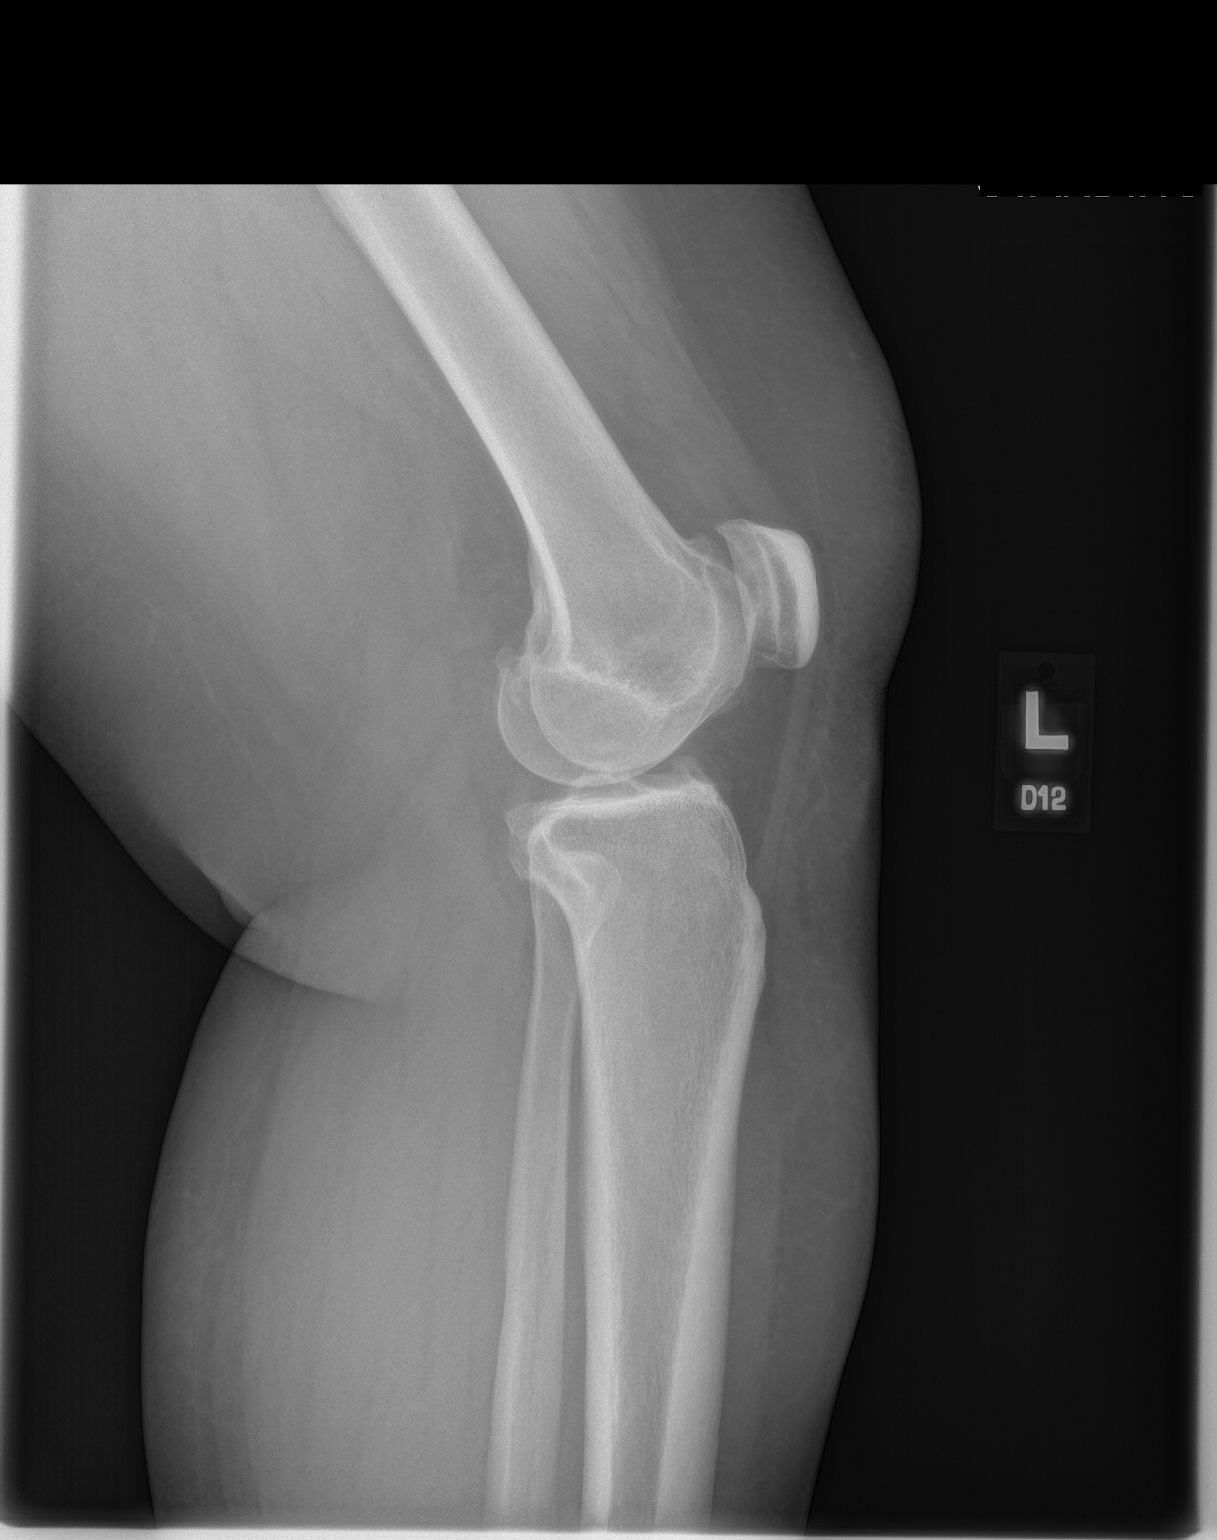

[2 of 2 positions shown; findings below may reference images not displayed]

FINDINGS: Two view radiograph left knee demonstrates normal alignment. No
acute fracture or dislocation. There is at least mild
tricompartmental degenerative arthritis with osteophyte formation
noted. No effusion. Soft tissues are unremarkable.
IMPRESSION: Mild tricompartmental degenerative arthritis.

## 2021-07-11 LAB — B12 AND FOLATE PANEL
Folate: 5.2 ng/mL (ref >3.0)
Vitamin B-12: 538 pg/mL (ref 232–1245)

## 2021-07-11 LAB — COMPREHENSIVE METABOLIC PANEL
ALT: 90 IU/L — ABNORMAL HIGH (ref 0–32)
AST: 54 IU/L — ABNORMAL HIGH (ref 0–40)
Albumin/Globulin Ratio: 1.4 (ref 1.2–2.2)
Albumin: 4 g/dL (ref 3.8–4.8)
Alkaline Phosphatase: 72 IU/L (ref 44–121)
BUN/Creatinine Ratio: 15 (ref 9–23)
BUN: 10 mg/dL (ref 6–20)
Bilirubin Total: 0.6 mg/dL (ref 0.0–1.2)
CO2: 23 mmol/L (ref 20–29)
Calcium: 9.4 mg/dL (ref 8.7–10.2)
Chloride: 102 mmol/L (ref 96–106)
Creatinine, Ser: 0.66 mg/dL (ref 0.57–1.00)
Globulin, Total: 2.9 g/dL (ref 1.5–4.5)
Glucose: 117 mg/dL — ABNORMAL HIGH (ref 70–99)
Potassium: 4.3 mmol/L (ref 3.5–5.2)
Sodium: 139 mmol/L (ref 134–144)
Total Protein: 6.9 g/dL (ref 6.0–8.5)
eGFR: 114 mL/min/{1.73_m2} (ref >59)

## 2021-07-11 LAB — VITAMIN D 25 HYDROXY (VIT D DEFICIENCY, FRACTURES): Vit D, 25-Hydroxy: 18.1 ng/mL — ABNORMAL LOW (ref 30.0–100.0)

## 2021-07-14 ENCOUNTER — Other Ambulatory Visit: Payer: Self-pay | Admitting: Nurse Practitioner

## 2021-07-17 NOTE — Progress Notes (Signed)
I have reviewed the lab results. There are no critically abnormal values requiring immediate intervention but there are some abnormals that will be discussed at the next office visit.  

## 2021-07-20 ENCOUNTER — Other Ambulatory Visit: Payer: Self-pay | Admitting: Adult Health

## 2021-07-20 DIAGNOSIS — I10 Essential (primary) hypertension: Secondary | ICD-10-CM

## 2021-07-24 ENCOUNTER — Encounter: Payer: Self-pay | Admitting: Nurse Practitioner

## 2021-07-24 ENCOUNTER — Ambulatory Visit: Payer: Medicaid Other | Admitting: Nurse Practitioner

## 2021-07-24 ENCOUNTER — Telehealth: Payer: Self-pay

## 2021-07-24 ENCOUNTER — Other Ambulatory Visit: Payer: Self-pay

## 2021-07-24 VITALS — BP 120/80 | HR 85 | Temp 98.4°F | Resp 16 | Ht 60.0 in | Wt 244.2 lb

## 2021-07-24 DIAGNOSIS — Z0001 Encounter for general adult medical examination with abnormal findings: Secondary | ICD-10-CM | POA: Diagnosis not present

## 2021-07-24 DIAGNOSIS — I10 Essential (primary) hypertension: Secondary | ICD-10-CM | POA: Diagnosis not present

## 2021-07-24 DIAGNOSIS — Z113 Encounter for screening for infections with a predominantly sexual mode of transmission: Secondary | ICD-10-CM | POA: Diagnosis not present

## 2021-07-24 DIAGNOSIS — E782 Mixed hyperlipidemia: Secondary | ICD-10-CM

## 2021-07-24 DIAGNOSIS — R7301 Impaired fasting glucose: Secondary | ICD-10-CM

## 2021-07-24 DIAGNOSIS — Z124 Encounter for screening for malignant neoplasm of cervix: Secondary | ICD-10-CM

## 2021-07-24 DIAGNOSIS — E661 Drug-induced obesity: Secondary | ICD-10-CM

## 2021-07-24 DIAGNOSIS — D638 Anemia in other chronic diseases classified elsewhere: Secondary | ICD-10-CM

## 2021-07-24 DIAGNOSIS — Z01419 Encounter for gynecological examination (general) (routine) without abnormal findings: Secondary | ICD-10-CM

## 2021-07-24 DIAGNOSIS — E559 Vitamin D deficiency, unspecified: Secondary | ICD-10-CM

## 2021-07-24 DIAGNOSIS — E66813 Obesity, class 3: Secondary | ICD-10-CM

## 2021-07-24 DIAGNOSIS — R7989 Other specified abnormal findings of blood chemistry: Secondary | ICD-10-CM | POA: Diagnosis not present

## 2021-07-24 DIAGNOSIS — R300 Dysuria: Secondary | ICD-10-CM

## 2021-07-24 DIAGNOSIS — Z6841 Body Mass Index (BMI) 40.0 and over, adult: Secondary | ICD-10-CM

## 2021-07-24 MED ORDER — VITAMIN D (ERGOCALCIFEROL) 1.25 MG (50000 UNIT) PO CAPS: 50000.0000 [IU] | ORAL_CAPSULE | ORAL | 3 refills | Status: DC

## 2021-07-24 MED ORDER — OZEMPIC (0.25 OR 0.5 MG/DOSE) 2 MG/1.5ML ~~LOC~~ SOPN: PEN_INJECTOR | SUBCUTANEOUS | 2 refills | Status: DC

## 2021-07-24 MED ORDER — OZEMPIC (0.25 OR 0.5 MG/DOSE) 2 MG/1.5ML ~~LOC~~ SOPN: 0.5000 mg | PEN_INJECTOR | SUBCUTANEOUS | 2 refills | Status: DC

## 2021-07-24 NOTE — Progress Notes (Signed)
[facility] [place], [place] [address]  Internal MEDICINE  Office Visit Note  Patient Name: Natasha Moreno  [id number]  [id number]  Date of Service: 07/24/2021  Chief Complaint  Patient presents with   Annual Exam    Review labs   Hyperlipidemia   Hypertension   Weight Loss    HPI Billye presents for an annual well visit and physical exam. She is a well appearing 40 yo female.  She has hypertension, hyperlipidemia and history of a stroke. She recently had a sleep study and it confirmed obstructive sleep apnea. She has a DME order for a CPAP machine but has not received it yet.  Her blood pressure is well controlled. She is taking bisoprolol and hydrochlorothiazide.  For hyperlipidemia, she takes atorvastatin but has not had her lipid panel checked in a long time.  She has had a metabolic test in the past and would like to take medication to aid in weight loss. She is also wanting information on meal plans and other advice. Her A1C was 5.5 in September.  Her metabolic panel was drawn in October and her liver enzymes were elevated. Her B12 was normal and folate was low normal. Her vitamin D level remains significantly low.  -with having OSA, and chronic tonsil stones, interested in seeing ENT.  She denies any pain, no other concerns or questions.    Current Medication: Outpatient Encounter Medications as of 07/24/2021  Medication Sig   albuterol (PROAIR HFA) 108 (90 Base) MCG/ACT inhaler TAKE 2 PUFFS BY MOUTH EVERY 6 HOURS AS NEEDED FOR WHEEZE OR SHORTNESS OF BREATH   aspirin EC 325 MG tablet Take 1 tablet (325 mg total) by mouth daily.   atorvastatin (LIPITOR) 10 MG tablet Take 1 tablet (10 mg total) by mouth daily.   bisoprolol-hydrochlorothiazide (ZIAC) 2.5-6.25 MG tablet TAKE 1 TABLET BY MOUTH EVERY DAY IN THE MORNING FOR BLOOD PRESSURE   cetirizine (ZYRTEC) 10 MG tablet Take 10 mg by mouth daily as needed for allergies.    hydrochlorothiazide  (HYDRODIURIL) 12.5 MG tablet TAKE 1 TABLET BY MOUTH EVERY DAY   Vitamin D, Ergocalciferol, (DRISDOL) 1.25 MG (50000 UNIT) CAPS capsule Take 1 capsule (50,000 Units total) by mouth every 7 (seven) days.   [DISCONTINUED] Semaglutide,0.25 or 0.5MG /DOS, (OZEMPIC, 0.25 OR 0.5 MG/DOSE,) 2 MG/1.5ML SOPN Inject 0.5 mg into the skin once a week.   meloxicam (MOBIC) 15 MG tablet meloxicam 15 mg tablet  TAKE 1 TABLET BY MOUTH EVERY DAY WITH A MEAL   Semaglutide,0.25 or 0.5MG /DOS, (OZEMPIC, 0.25 OR 0.5 MG/DOSE,) 2 MG/1.5ML SOPN Inject 0.25 mg subcutaneously every week for 4 weeks then increase to 0.5 mg weekly.   [DISCONTINUED] benzonatate (TESSALON) 100 MG capsule Take 1 capsule (100 mg total) by mouth 2 (two) times daily as needed for cough. (Patient not taking: Reported on 07/24/2021)   [DISCONTINUED] hydrochlorothiazide (HYDRODIURIL) 12.5 MG tablet Take 1 tablet (12.5 mg total) by mouth daily.   [DISCONTINUED] ibuprofen (ADVIL) 600 MG tablet Take 1 tablet (600 mg total) by mouth every 8 (eight) hours as needed. (Patient not taking: Reported on 07/24/2021)   [DISCONTINUED] cyanocobalamin ((VITAMIN B-12)) injection 1,000 mcg    No facility-administered encounter medications on file as of 07/24/2021.    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[place])    Hyperlipidemia    Hypertension    Migraine    Morbid obesity ([place])    Stroke ([place])     Family History: Family History  Problem Relation Age of Onset   Kidney failure Father    Heart attack Father        open heart surgery   Hypertension Father    Diabetes Father    Gout Father     Cataracts Father     Social History   Socioeconomic History   Marital status: Single    Spouse name: Not on file   Number of children: 2   Years of education: Not on file   Highest education level: High school graduate  Occupational History   Not on file  Tobacco Use   Smoking status: Former    Packs/day: 0.25    Types: Cigarettes    Quit date: 11/09/2017    Years since quitting: 3.7   Smokeless tobacco: Never   Tobacco comments:    approximately 4 cigarettes a day  Vaping Use   Vaping Use: Former  Substance and Sexual Activity   Alcohol use: Not Currently    Comment: every other weekend, not much    Drug use: No   Sexual activity: Yes    Birth control/protection: Condom  Other Topics Concern   Not on file  Social History Narrative   Lives at home with her parents, brother and her children   Right handed   Drinks rare caffeine    Social Determinants of Health   Financial Resource Strain: Not on file  Food Insecurity: Not on file  Transportation Needs: Not on file  Physical Activity: Not on file  Stress: Not on file  Social Connections: Not on file  Intimate Partner Violence: Not on file      Review of Systems  Constitutional:  Negative for activity change, appetite change, chills, fatigue, fever and unexpected weight change.  HENT: Negative.  Negative for congestion, ear pain, rhinorrhea, sore throat and trouble swallowing.   Eyes: Negative.   Respiratory: Negative.  Negative for cough, chest tightness, shortness of breath and wheezing.   Cardiovascular: Negative.  Negative for chest pain.  Gastrointestinal: Negative.  Negative for abdominal pain, blood in stool, constipation, diarrhea, nausea and vomiting.  Endocrine: Negative.   Genitourinary: Negative.  Negative for difficulty urinating, dysuria, frequency, hematuria and urgency.  Musculoskeletal: Negative.  Negative for arthralgias, back pain, joint swelling, myalgias and neck pain.  Skin: Negative.   Negative for rash and wound.  Allergic/Immunologic: Negative.  Negative for immunocompromised state.  Neurological: Negative.  Negative for dizziness, seizures, numbness and headaches.  Hematological: Negative.   Psychiatric/Behavioral: Negative.  Negative for behavioral problems, self-injury and suicidal ideas. The patient is not nervous/anxious.    Vital Signs: BP 120/80   Pulse 85   Temp 98.4 F (36.9 C)   Resp 16   Ht 5' (1.524 m)   Wt 244 lb 3.2 oz (110.8 kg)   SpO2 98%   BMI 47.69 kg/m    Physical Exam Vitals reviewed.  Constitutional:      General: She is awake. She is not in acute distress.    Appearance: Normal appearance. She is well-developed and well-groomed. She is obese. She is not ill-appearing or diaphoretic.  HENT:  Head: Normocephalic and atraumatic.     Right Ear: Tympanic membrane, ear canal and external ear normal.     Left Ear: Tympanic membrane, ear canal and external ear normal.     Nose: Nose normal. No congestion or rhinorrhea.     Mouth/Throat:     Lips: Pink.     Mouth: Mucous membranes are moist.     Pharynx: Oropharynx is clear. Uvula midline. No oropharyngeal exudate or posterior oropharyngeal erythema.  Eyes:     General: Lids are normal. Vision grossly intact. Gaze aligned appropriately. No scleral icterus.       Right eye: No discharge.        Left eye: No discharge.     Extraocular Movements: Extraocular movements intact.     Conjunctiva/sclera: Conjunctivae normal.     Pupils: Pupils are equal, round, and reactive to light.     Funduscopic exam:    Right eye: Red reflex present.        Left eye: Red reflex present. Neck:     Thyroid: No thyromegaly.     Vascular: No carotid bruit or JVD.     Trachea: Trachea and phonation normal. No tracheal deviation.  Cardiovascular:     Rate and Rhythm: Normal rate and regular rhythm.     Pulses: Normal pulses.     Heart sounds: Normal heart sounds, S1 normal and S2 normal. No murmur heard.    No friction rub. No gallop.  Pulmonary:     Effort: Pulmonary effort is normal. No accessory muscle usage or respiratory distress.     Breath sounds: Normal breath sounds and air entry. No stridor. No wheezing or rales.  Chest:     Chest wall: No tenderness.     Comments: Declined clinical breast exam, will get first annual screening mammogram next year.  Abdominal:     General: Bowel sounds are normal. There is no distension.     Palpations: Abdomen is soft. There is no shifting dullness, fluid wave, mass or pulsatile mass.     Tenderness: There is no abdominal tenderness. There is no guarding or rebound.     Hernia: There is no hernia in the left inguinal area or right inguinal area.  Genitourinary:    General: Normal vulva.     Exam position: Lithotomy position.     Labia:        Right: No rash, tenderness, lesion or injury.        Left: No rash, tenderness, lesion or injury.      Urethra: No prolapse, urethral swelling or urethral lesion.     Vagina: Normal. No signs of injury and foreign body. No vaginal discharge, erythema, tenderness, bleeding, lesions or prolapsed vaginal walls.     Cervix: No cervical motion tenderness, discharge, friability, lesion, erythema, cervical bleeding or eversion.     Uterus: Normal. Not deviated, not fixed and no uterine prolapse.      Adnexa: Right adnexa normal and left adnexa normal.     Rectum: No tenderness, anal fissure or external hemorrhoid. Normal anal tone.  Musculoskeletal:        General: No tenderness or deformity. Normal range of motion.     Cervical back: Normal range of motion and neck supple.     Right lower leg: No edema.     Left lower leg: No edema.  Lymphadenopathy:     Cervical: No cervical adenopathy.     Lower Body: No right inguinal adenopathy. No left inguinal adenopathy.  Skin:    General: Skin is warm and dry.     Capillary Refill: Capillary refill takes less than 2 seconds.     Coloration: Skin is not pale.      Findings: No erythema or rash.  Neurological:     Mental Status: She is alert and oriented to person, place, and time.     Cranial Nerves: No cranial nerve deficit.     Motor: No abnormal muscle tone.     Coordination: Coordination normal.     Gait: Gait normal.     Deep Tendon Reflexes: Reflexes are normal and symmetric.  Psychiatric:        Mood and Affect: Mood and affect normal.        Behavior: Behavior normal. Behavior is cooperative.        Thought Content: Thought content normal.        Judgment: Judgment normal.       Assessment/Plan: 1. Encounter for routine adult health examination with abnormal findings Age-appropriate preventive screenings and vaccinations discussed, annual physical exam completed. Routine labs for health maintenance ordered, see below. PHM updated.   2. Essential hypertension, benign Stable with current medications.   3. Mixed hyperlipidemia Routine lab ordered. Currently taking atorvastatin.  - Lipid Profile  4. Impaired fasting glucose Elevated glucose levels, A1C is normal, starting ozempic to aid in weight loss.  - Semaglutide,0.25 or 0.5MG /DOS, (OZEMPIC, 0.25 OR 0.5 MG/DOSE,) 2 MG/1.5ML SOPN; Inject 0.25 mg subcutaneously every week for 4 weeks then increase to 0.5 mg weekly.  Dispense: 3 mL; Refill: 2  5. Abnormal liver function test  She has had elevated liver enzymes on her most recent metabolic panel. Will repeat the lab, if still abnormal, she may need an ultrasound of the liver.  - Hepatic function panel  6. Anemia in other chronic diseases classified elsewhere Patient has chronic anemia, routine CBC ordered. Consider referral to hematology if she has not already seen them in the past, no recent office visit notes found from heme/onc.  - CBC with Differential/Platelet  7. Vitamin D deficiency Vitamin D level remains significantly low, vitamin D 50,000 units weekly prescribed.  - Vitamin D, Ergocalciferol, (DRISDOL) 1.25 MG (50000  UNIT) CAPS capsule; Take 1 capsule (50,000 Units total) by mouth every 7 (seven) days.  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 Obesity Counseling: Risk Assessment: An assessment of behavioral risk factors was made today and includes lack of exercise sedentary lifestyle, lack of portion control and poor dietary habits. The patient has been screened for diabetes and a metabolic test to determine basal metabolic rate has been performed.   Risk Modification Advice: The patient was counseled on portion control guidelines. Safe recommendations on caloric restriction discussed with patient. Sample meals plans were provided. General guidelines on diet and lifestyle modifications were discussed at length. Introducing physical activity as tolerated and at the patient's ability level is recommended.   9. Encounter for screening for malignant neoplasm of cervix Pap done with pelvic exam, will call patient with results.   10. Routine screening for STI (sexually transmitted infection) Nuswab specimen obtained   12. Encounter for gynecological examination (general) (routine) without abnormal findings Routine pelvic exam and bimanual exam performed in office today.       General Counseling: shasmeen loeffel understanding  of the findings of todays visit and agrees with plan of treatment. I have discussed any further diagnostic evaluation that may be needed or ordered today. We also reviewed her medications today. she has been encouraged to call the office with any questions or concerns that should arise related to todays visit.    Orders Placed This Encounter  Procedures   Lipid Profile   CBC with Differential/Platelet   Hepatic function panel    Meds ordered this encounter  Medications    DISCONTD: Semaglutide,0.25 or 0.5MG /DOS, (OZEMPIC, 0.25 OR 0.5 MG/DOSE,) 2 MG/1.5ML SOPN    Sig: Inject 0.5 mg into the skin once a week.    Dispense:  3 mL    Refill:  2   Vitamin D, Ergocalciferol, (DRISDOL) 1.25 MG (50000 UNIT) CAPS capsule    Sig: Take 1 capsule (50,000 Units total) by mouth every 7 (seven) days.    Dispense:  5 capsule    Refill:  3   Semaglutide,0.25 or 0.5MG /DOS, (OZEMPIC, 0.25 OR 0.5 MG/DOSE,) 2 MG/1.5ML SOPN    Sig: Inject 0.25 mg subcutaneously every week for 4 weeks then increase to 0.5 mg weekly.    Dispense:  3 mL    Refill:  2    Please discontinue previous prescription for ozempic, note administration instructions and dose.    Return in about 1 month (around 08/23/2021) for F/U, Weight loss, Madeleine Fenn PCP.   Total time spent:30 Minutes Time spent includes review of chart, medications, test results, and follow up plan with the patient.   [place] Controlled Substance Database was reviewed by me.  This patient was seen by Jonetta Osgood, FNP-C in collaboration with Dr. Clayborn Bigness as a part of collaborative care agreement.  Semira Stoltzfus R. Valetta Fuller, MSN, FNP-C Internal medicine

## 2021-07-24 NOTE — Telephone Encounter (Signed)
Patients CPAP order was sent over to [facility].

## 2021-07-31 LAB — CBC WITH DIFFERENTIAL/PLATELET
Basophils Absolute: 0.1 10*3/uL (ref 0.0–0.2)
Basos: 1 %
EOS (ABSOLUTE): 0.4 10*3/uL (ref 0.0–0.4)
Eos: 4 %
Hematocrit: 34.1 % (ref 34.0–46.6)
Hemoglobin: 10.5 g/dL — ABNORMAL LOW (ref 11.1–15.9)
Immature Grans (Abs): 0.1 10*3/uL (ref 0.0–0.1)
Immature Granulocytes: 1 %
Lymphocytes Absolute: 2.4 10*3/uL (ref 0.7–3.1)
Lymphs: 25 %
MCH: 16.3 pg — ABNORMAL LOW (ref 26.6–33.0)
MCHC: 30.8 g/dL — ABNORMAL LOW (ref 31.5–35.7)
MCV: 53 fL — ABNORMAL LOW (ref 79–97)
Monocytes Absolute: 0.5 10*3/uL (ref 0.1–0.9)
Monocytes: 5 %
Neutrophils Absolute: 6 10*3/uL (ref 1.4–7.0)
Neutrophils: 64 %
Platelets: 351 10*3/uL (ref 150–450)
RBC: 6.43 x10E6/uL — ABNORMAL HIGH (ref 3.77–5.28)
RDW: 21.2 % — ABNORMAL HIGH (ref 11.7–15.4)
WBC: 9.4 10*3/uL (ref 3.4–10.8)

## 2021-07-31 LAB — HEPATIC FUNCTION PANEL
ALT: 92 IU/L — ABNORMAL HIGH (ref 0–32)
AST: 50 IU/L — ABNORMAL HIGH (ref 0–40)
Albumin: 4.1 g/dL (ref 3.8–4.8)
Alkaline Phosphatase: 85 IU/L (ref 44–121)
Bilirubin Total: 0.7 mg/dL (ref 0.0–1.2)
Bilirubin, Direct: 0.23 mg/dL (ref 0.00–0.40)
Total Protein: 7.1 g/dL (ref 6.0–8.5)

## 2021-07-31 LAB — LIPID PANEL
Chol/HDL Ratio: 2.1 ratio (ref 0.0–4.4)
Cholesterol, Total: 103 mg/dL (ref 100–199)
HDL: 50 mg/dL (ref >39)
LDL Chol Calc (NIH): 32 mg/dL (ref 0–99)
Triglycerides: 120 mg/dL (ref 0–149)
VLDL Cholesterol Cal: 21 mg/dL (ref 5–40)

## 2021-08-01 NOTE — Progress Notes (Signed)
I have reviewed the lab results. There are no critically abnormal values requiring immediate intervention but there are some abnormals that will be discussed at the next office visit.  

## 2021-08-19 ENCOUNTER — Ambulatory Visit: Payer: Medicaid Other | Admitting: Nurse Practitioner

## 2021-08-21 ENCOUNTER — Encounter: Payer: Self-pay | Admitting: Nurse Practitioner

## 2021-08-21 ENCOUNTER — Ambulatory Visit: Payer: Medicaid Other | Admitting: Nurse Practitioner

## 2021-08-26 ENCOUNTER — Encounter: Payer: Self-pay | Admitting: Nurse Practitioner

## 2021-08-26 ENCOUNTER — Ambulatory Visit: Payer: Medicaid Other | Admitting: Nurse Practitioner

## 2021-08-26 ENCOUNTER — Other Ambulatory Visit: Payer: Self-pay

## 2021-08-26 VITALS — BP 122/90 | HR 97 | Temp 98.4°F | Resp 16 | Ht 60.0 in | Wt 244.2 lb

## 2021-08-26 DIAGNOSIS — J351 Hypertrophy of tonsils: Secondary | ICD-10-CM

## 2021-08-26 DIAGNOSIS — I10 Essential (primary) hypertension: Secondary | ICD-10-CM

## 2021-08-26 DIAGNOSIS — R7989 Other specified abnormal findings of blood chemistry: Secondary | ICD-10-CM | POA: Diagnosis not present

## 2021-08-26 DIAGNOSIS — R7301 Impaired fasting glucose: Secondary | ICD-10-CM | POA: Diagnosis not present

## 2021-08-26 DIAGNOSIS — J358 Other chronic diseases of tonsils and adenoids: Secondary | ICD-10-CM | POA: Diagnosis not present

## 2021-08-26 DIAGNOSIS — Z6841 Body Mass Index (BMI) 40.0 and over, adult: Secondary | ICD-10-CM

## 2021-08-26 MED ORDER — TRAZODONE HCL 50 MG PO TABS: 50.0000 mg | ORAL_TABLET | Freq: Every evening | ORAL | 3 refills | Status: DC | PRN

## 2021-08-26 MED ORDER — SEMAGLUTIDE (1 MG/DOSE) 4 MG/3ML ~~LOC~~ SOPN
1.0000 mg | PEN_INJECTOR | SUBCUTANEOUS | 3 refills | Status: DC
Start: 2021-08-26 — End: 2021-10-10

## 2021-08-26 NOTE — Progress Notes (Signed)
[facility] [address] [place], [place] [address]  Internal MEDICINE  Office Visit Note  Patient Name: Natasha Moreno  [id number]  [id number]  Date of Service: 08/26/2021  Chief Complaint  Patient presents with   Follow-up    Look at tonsils, discuss cpap, discuss gallbladder    Weight Loss   Results    HPI [name] presents for a follow up visit to discuss CPAP and tonsils. She reports having large tonsils that have always been touching or close to touching and she is worried that this is effecting her airway when she is sleeping.  She is waiting for call from kelly about CPAP She has impaired fasting glucose. Her A1c was 5.5 in September. Wants help with weight loss.     Current Medication: Outpatient Encounter Medications as of 08/26/2021  Medication Sig   albuterol (PROAIR HFA) 108 (90 Base) MCG/ACT inhaler TAKE 2 PUFFS BY MOUTH EVERY 6 HOURS AS NEEDED FOR WHEEZE OR SHORTNESS OF BREATH   aspirin EC 325 MG tablet Take 1 tablet (325 mg total) by mouth daily.   atorvastatin (LIPITOR) 10 MG tablet Take 1 tablet (10 mg total) by mouth daily.   bisoprolol-hydrochlorothiazide (ZIAC) 2.5-6.25 MG tablet TAKE 1 TABLET BY MOUTH EVERY DAY IN THE MORNING FOR BLOOD PRESSURE   cetirizine (ZYRTEC) 10 MG tablet Take 10 mg by mouth daily as needed for allergies.    hydrochlorothiazide (HYDRODIURIL) 12.5 MG tablet TAKE 1 TABLET BY MOUTH EVERY DAY   meloxicam (MOBIC) 15 MG tablet meloxicam 15 mg tablet  TAKE 1 TABLET BY MOUTH EVERY DAY WITH A MEAL   Semaglutide, 1 MG/DOSE, 4 MG/3ML SOPN Inject 1 mg into the skin once a week.   Vitamin D, Ergocalciferol, (DRISDOL) 1.25 MG (50000 UNIT) CAPS capsule Take 1 capsule (50,000 Units total) by mouth every 7 (seven) days.   [DISCONTINUED] Semaglutide,0.25 or 0.5MG /DOS, (OZEMPIC, 0.25 OR 0.5 MG/DOSE,) 2 MG/1.5ML SOPN Inject 0.25 mg subcutaneously every week for 4 weeks then increase to 0.5 mg weekly.   [DISCONTINUED] traZODone (DESYREL) 50 MG  tablet Take 1-2 tablets (50-100 mg total) by mouth at bedtime as needed for sleep.   No facility-administered encounter medications on file as of 08/26/2021.    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[facility])    Hyperlipidemia    Hypertension    Migraine    Morbid obesity ([facility])    Stroke ([facility])     Family History: Family History  Problem Relation Age of Onset   Kidney failure Father    Heart attack Father        open heart surgery   Hypertension Father    Diabetes Father    Gout Father    Cataracts Father     Social History   Socioeconomic History   Marital status: Single    Spouse name: Not on file   Number of children: 2   Years of education: Not on file   Highest education level: High school graduate  Occupational History   Not on file  Tobacco Use   Smoking status: Former    Packs/day: 0.25  Types: Cigarettes    Quit date: 11/09/2017    Years since quitting: 3.8   Smokeless tobacco: Never   Tobacco comments:    approximately 4 cigarettes a day  Vaping Use   Vaping Use: Former  Substance and Sexual Activity   Alcohol use: Not Currently    Comment: every other weekend, not much    Drug use: No   Sexual activity: Yes    Birth control/protection: Condom  Other Topics Concern   Not on file  Social History Narrative   Lives at home with her parents, brother and her children   Right handed   Drinks rare caffeine    Social Determinants of Health   Financial Resource Strain: Not on file  Food Insecurity: Not on file  Transportation Needs: Not on file  Physical Activity: Not on file   Stress: Not on file  Social Connections: Not on file  Intimate Partner Violence: Not on file      Review of Systems  Constitutional:  Negative for chills, fatigue and unexpected weight change.  HENT:  Negative for congestion, rhinorrhea, sneezing and sore throat.   Eyes:  Negative for redness.  Respiratory:  Negative for cough, chest tightness and shortness of breath.   Cardiovascular:  Negative for chest pain and palpitations.  Gastrointestinal:  Negative for abdominal pain, constipation, diarrhea, nausea and vomiting.  Genitourinary:  Negative for dysuria and frequency.  Musculoskeletal:  Negative for arthralgias, back pain, joint swelling and neck pain.  Skin:  Negative for rash.  Neurological: Negative.  Negative for tremors and numbness.  Hematological:  Negative for adenopathy. Does not bruise/bleed easily.  Psychiatric/Behavioral:  Negative for behavioral problems (Depression), sleep disturbance and suicidal ideas. The patient is not nervous/anxious.    Vital Signs: BP 122/90   Pulse 97 Comment: 123  Temp 98.4 F (36.9 C)   Resp 16   Ht 5' (1.524 m)   Wt 244 lb 3.2 oz (110.8 kg)   SpO2 99%   BMI 47.69 kg/m    Physical Exam Vitals reviewed.  Constitutional:      General: She is not in acute distress.    Appearance: Normal appearance. She is morbidly obese. She is not ill-appearing.  HENT:     Head: Normocephalic and atraumatic.  Eyes:     Pupils: Pupils are equal, round, and reactive to light.  Cardiovascular:     Rate and Rhythm: Normal rate and regular rhythm.  Pulmonary:     Effort: Pulmonary effort is normal. No respiratory distress.  Neurological:     Mental Status: She is alert and oriented to person, place, and time.     Cranial Nerves: No cranial nerve deficit.     Coordination: Coordination normal.     Gait: Gait normal.  Psychiatric:        Mood and Affect: Mood normal.        Behavior: Behavior normal.       Assessment/Plan: 1. Impaired  fasting glucose Ozempic dose increased - Semaglutide, 1 MG/DOSE, 4 MG/3ML SOPN; Inject 1 mg into the skin once a week.  Dispense: 3 mL; Refill: 3  2. Essential hypertension, benign Blood pressure is stable.   3. Abnormal liver function tests Ultrasound of liver ordered - US Abdomen Limited RUQ (LIVER/GB); Future  4. Tonsillolith Has chronic tonsil stones, wants tonsils removed, refer to ENT - Ambulatory referral to ENT  5. Enlarged tonsils Refer to ENT - Ambulatory referral to ENT  6. Class 3 severe obesity due  to excess calories with serious comorbidity and body mass index (BMI) of 45.0 to 49.9 in adult (HCC) Ozempic dose increased   General Counseling: Latiesha verbalizes understanding of the findings of todays visit and agrees with plan of treatment. I have discussed any further diagnostic evaluation that may be needed or ordered today. We also reviewed her medications today. she has been encouraged to call the office with any questions or concerns that should arise related to todays visit.    Orders Placed This Encounter  Procedures   US Abdomen Limited RUQ (LIVER/GB)   Ambulatory referral to ENT    Meds ordered this encounter  Medications   Semaglutide, 1 MG/DOSE, 4 MG/3ML SOPN    Sig: Inject 1 mg into the skin once a week.    Dispense:  3 mL    Refill:  3   DISCONTD: traZODone (DESYREL) 50 MG tablet    Sig: Take 1-2 tablets (50-100 mg total) by mouth at bedtime as needed for sleep.    Dispense:  30 tablet    Refill:  3    Return in about 4 weeks (around 09/23/2021) for F/U, U/S @ [facility], Weight loss, Verlin Duke PCP.   Total time spent:30 Minutes Time spent includes review of chart, medications, test results, and follow up plan with the patient.   [place] Controlled Substance Database was reviewed by me.  This patient was seen by Sallyanne Kuster, FNP-C in collaboration with Dr. Beverely Risen as a part of collaborative care agreement.   Davaun Quintela R. Tedd Sias, MSN,  FNP-C Internal medicine

## 2021-08-27 ENCOUNTER — Telehealth: Payer: Self-pay

## 2021-08-27 NOTE — Telephone Encounter (Signed)
Awaiting 08/26/21 office notes for Ent referral-[facility]

## 2021-08-29 ENCOUNTER — Telehealth: Payer: Self-pay

## 2021-08-29 NOTE — Telephone Encounter (Signed)
Lmom for patient to contact our office to schedule a follow up with Tresa Endo for recent cpap  setup. Per Tresa Endo patient needs follow up with her either Wednesday in January that she is seeing patients. After patient sees kelly patient needs to follow up with [facility] for CPAP compliance before February 25,2023.

## 2021-09-02 NOTE — Telephone Encounter (Signed)
ENT referral sent via [facility] to [facility]

## 2021-09-05 ENCOUNTER — Other Ambulatory Visit: Payer: Self-pay | Admitting: Nurse Practitioner

## 2021-09-05 IMAGING — CR DG CHEST 2V
1 series · 2 of 2 positions shown · non-contrast
Comparison: Prior chest x-ray 05/15/2004.

CLINICAL DATA: Cough.  Difficulty breathing.

EXAM:
CHEST - 2 VIEW

[Series 1: dg chest 2 view · 0.14mm/px · 2 of 2 slices shown]
[im 1/2]
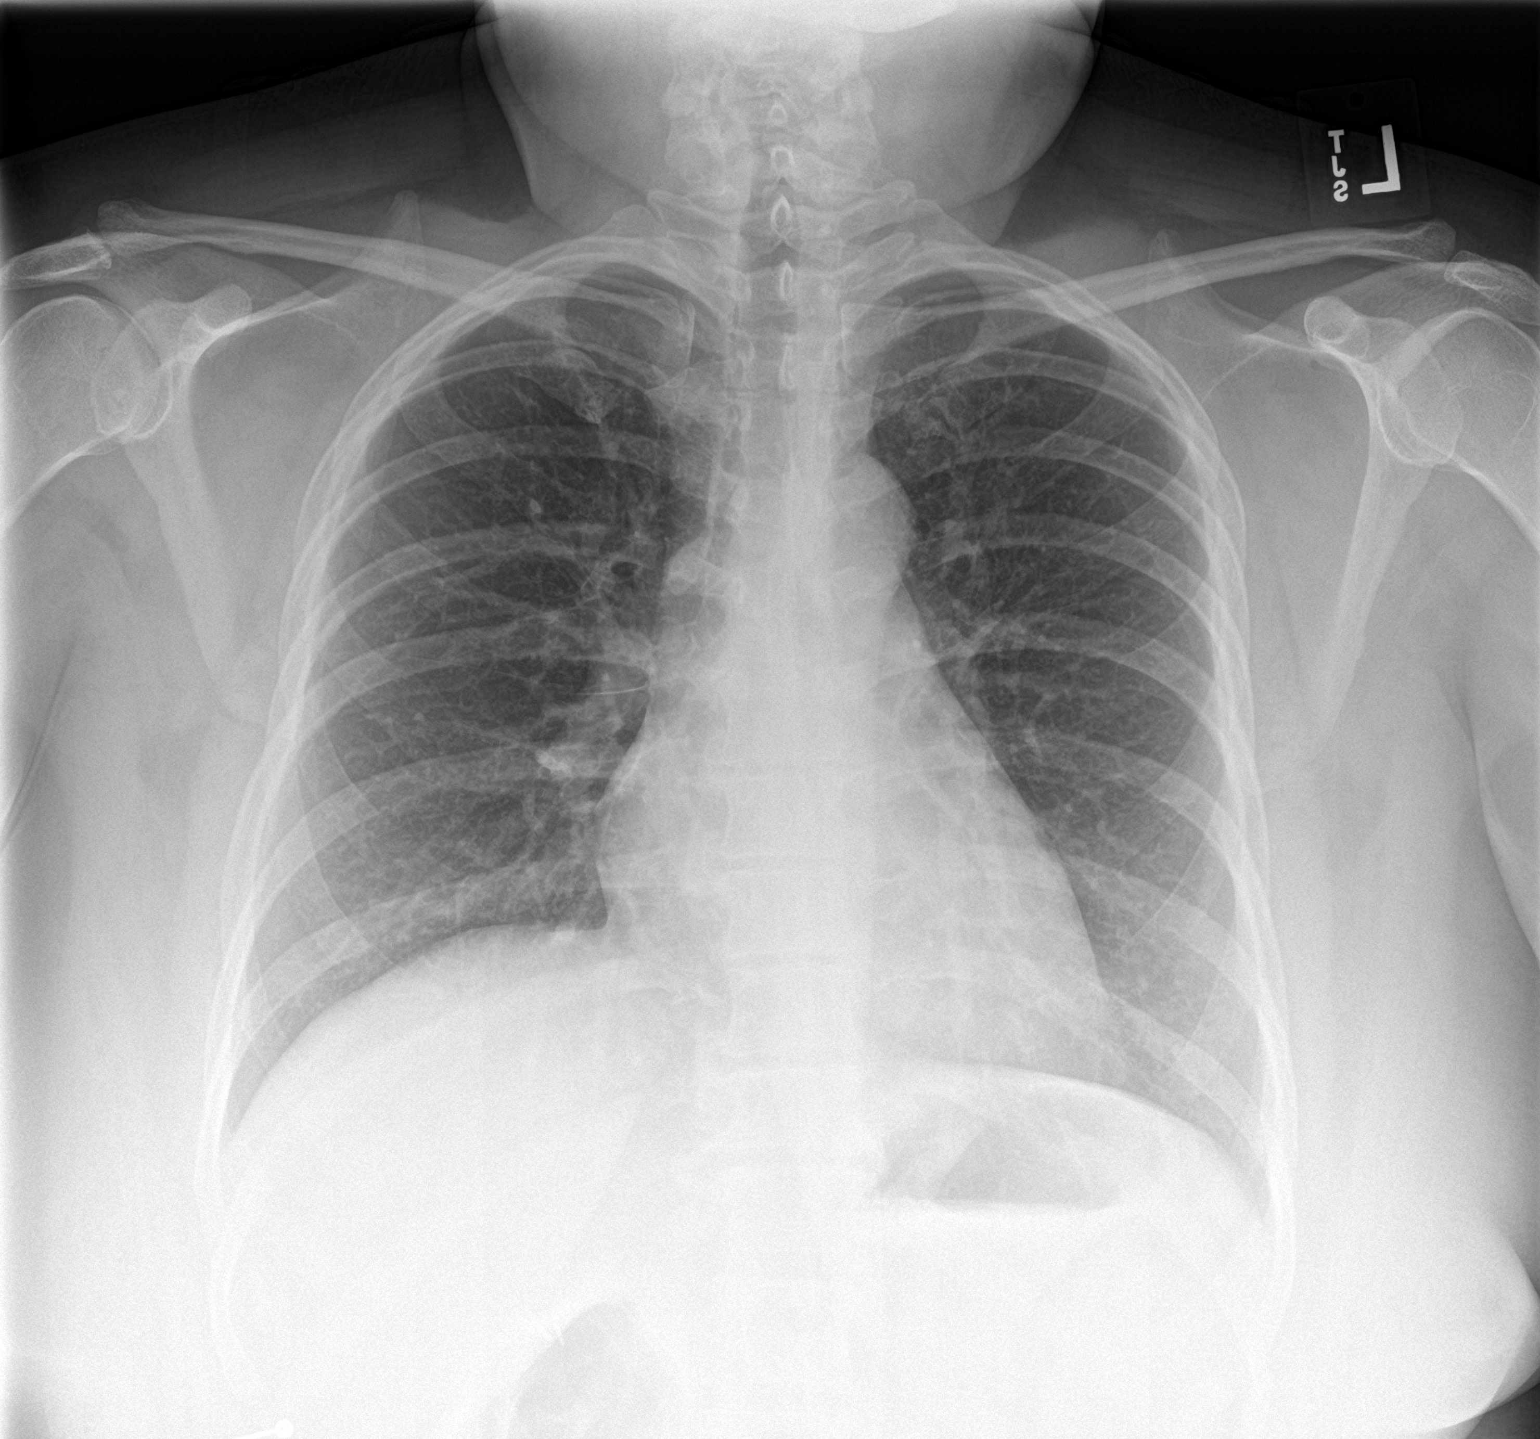
[im 2/2]
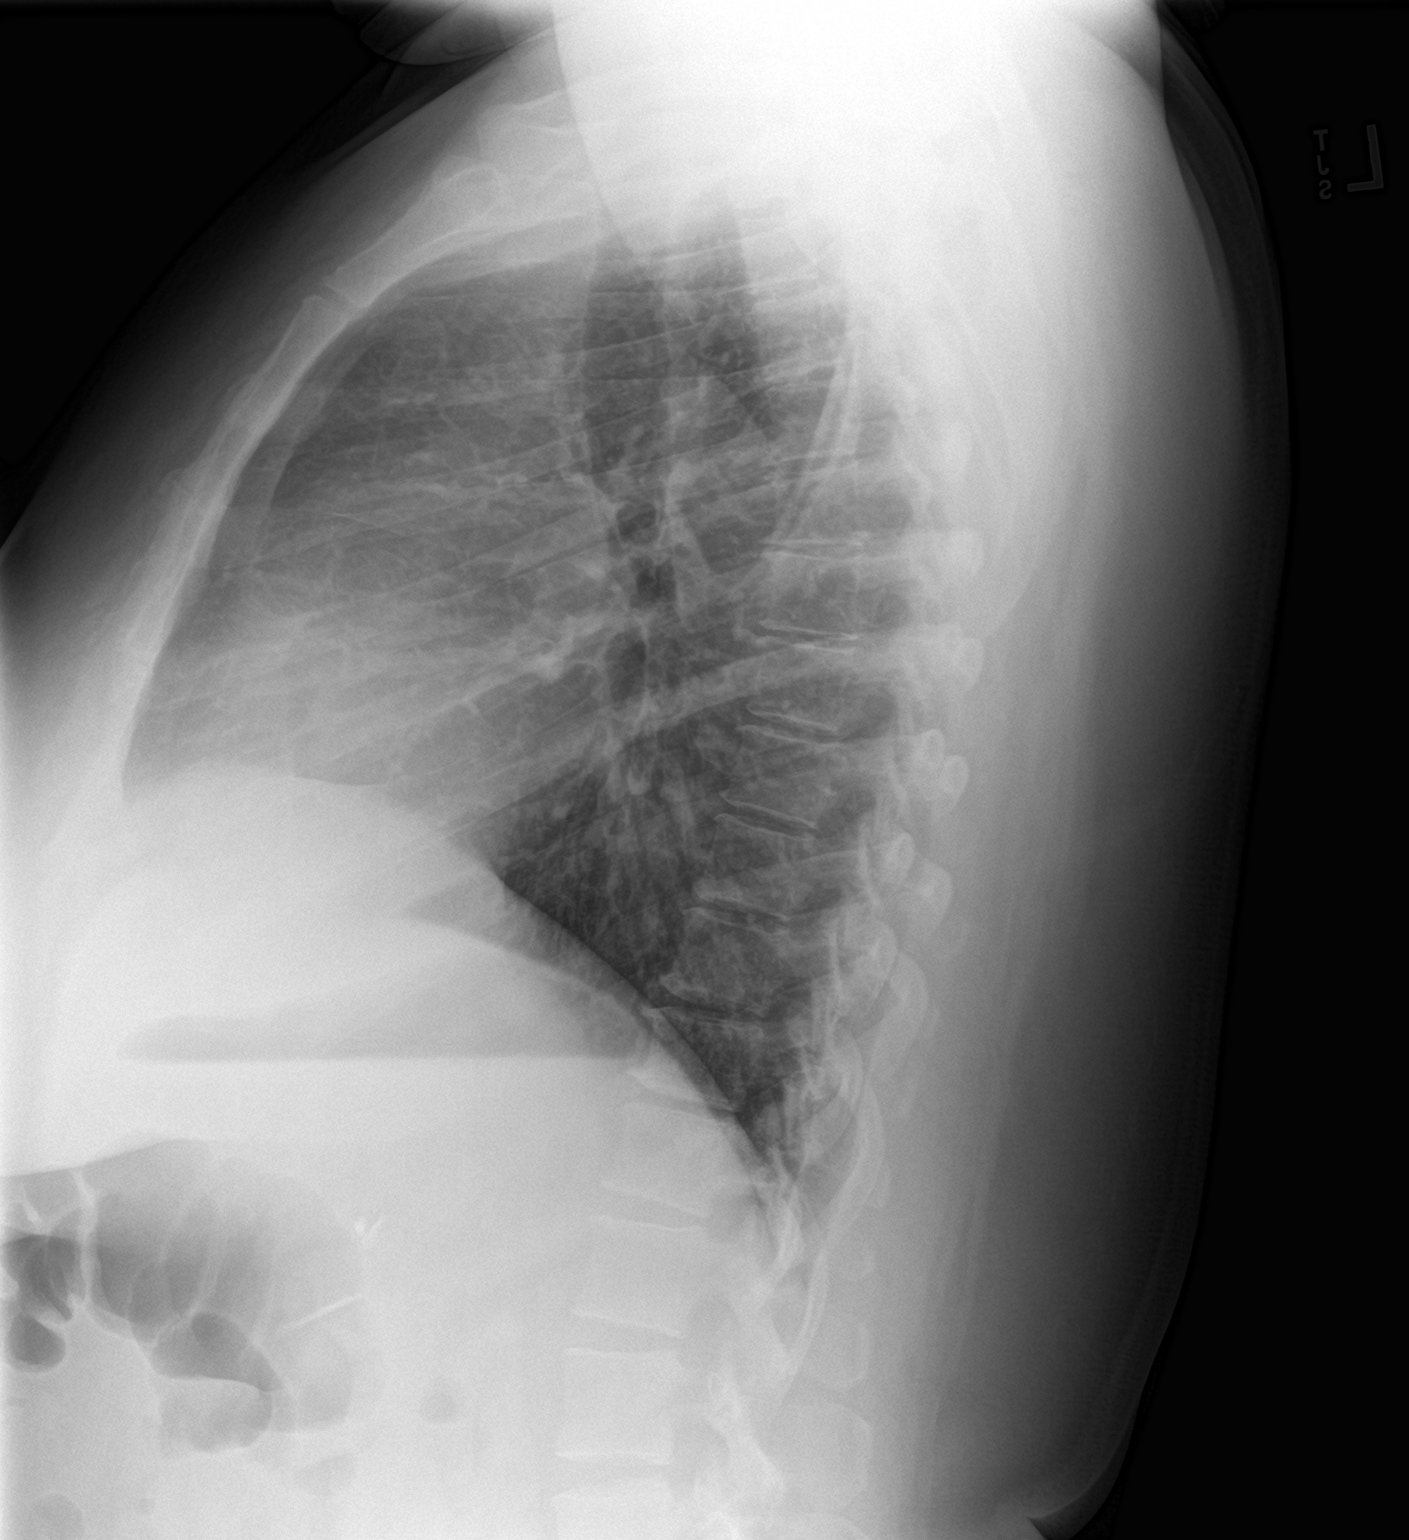

[2 of 2 positions shown; findings below may reference images not displayed]

FINDINGS: Mediastinum and hilar structures normal. Lungs are clear. No pleural
effusion or pneumothorax. Heart size normal. No pleural effusion or
pneumothorax. Surgical clips upper abdomen.
IMPRESSION: No acute cardiopulmonary disease.

## 2021-09-18 ENCOUNTER — Other Ambulatory Visit: Payer: Medicaid Other

## 2021-09-18 NOTE — Telephone Encounter (Signed)
Ent appointment scheduled for 10/14/21 @ 3:30-Toni

## 2021-09-24 ENCOUNTER — Encounter: Payer: Self-pay | Admitting: Nurse Practitioner

## 2021-09-25 ENCOUNTER — Ambulatory Visit: Payer: Medicaid Other | Admitting: Nurse Practitioner

## 2021-10-02 ENCOUNTER — Ambulatory Visit: Payer: Medicaid Other

## 2021-10-02 ENCOUNTER — Other Ambulatory Visit: Payer: Self-pay

## 2021-10-02 DIAGNOSIS — R7989 Other specified abnormal findings of blood chemistry: Secondary | ICD-10-CM | POA: Diagnosis not present

## 2021-10-10 ENCOUNTER — Ambulatory Visit: Payer: Medicaid Other | Admitting: Nurse Practitioner

## 2021-10-10 ENCOUNTER — Encounter: Payer: Self-pay | Admitting: Nurse Practitioner

## 2021-10-10 ENCOUNTER — Other Ambulatory Visit: Payer: Self-pay

## 2021-10-10 VITALS — BP 140/88 | HR 99 | Temp 98.4°F | Resp 16 | Ht 60.0 in | Wt 239.2 lb

## 2021-10-10 DIAGNOSIS — E782 Mixed hyperlipidemia: Secondary | ICD-10-CM | POA: Diagnosis not present

## 2021-10-10 DIAGNOSIS — R160 Hepatomegaly, not elsewhere classified: Secondary | ICD-10-CM | POA: Diagnosis not present

## 2021-10-10 DIAGNOSIS — R7301 Impaired fasting glucose: Secondary | ICD-10-CM | POA: Diagnosis not present

## 2021-10-10 DIAGNOSIS — E785 Hyperlipidemia, unspecified: Secondary | ICD-10-CM

## 2021-10-10 DIAGNOSIS — Z6841 Body Mass Index (BMI) 40.0 and over, adult: Secondary | ICD-10-CM

## 2021-10-10 NOTE — Progress Notes (Signed)
[facility] [address] [place], [place] [id number]  Internal MEDICINE  Office Visit Note  Patient Name: Natasha Moreno  191478Nov 18, 1982  [id number]  Date of Service: 10/10/2021  Chief Complaint  Patient presents with   Follow-up   Weight Loss   Results   Hyperlipidemia   Hypertension    HPI Natasha CellaDorothy presents for a follow-up visit for hypertension, US results and weight loss management.  Due to her prior labs showing elevated liver enzymes, an ultrasound of her liver was done which did show hepatomegaly.  Patient is currently taking atorvastatin. At her previous visit patient was started on Ozempic to aid in weight loss management, patient has lost 5 pounds since her previous office visit.      Current Medication: Outpatient Encounter Medications as of 10/10/2021  Medication Sig   albuterol (PROAIR HFA) 108 (90 Base) MCG/ACT inhaler TAKE 2 PUFFS BY MOUTH EVERY 6 HOURS AS NEEDED FOR WHEEZE OR SHORTNESS OF BREATH (Patient not taking: Reported on 11/01/2021)   cetirizine (ZYRTEC) 10 MG tablet Take 10 mg by mouth daily as needed for allergies.    Vitamin D, Ergocalciferol, (DRISDOL) 1.25 MG (50000 UNIT) CAPS capsule Take 1 capsule (50,000 Units total) by mouth every 7 (seven) days.   [DISCONTINUED] aspirin EC 325 MG tablet Take 1 tablet (325 mg total) by mouth daily. (Patient not taking: Reported on 11/01/2021)   [DISCONTINUED] atorvastatin (LIPITOR) 10 MG tablet Take 1 tablet (10 mg total) by mouth daily. (Patient not taking: Reported on 10/31/2021)   [DISCONTINUED] bisoprolol-hydrochlorothiazide (ZIAC) 2.5-6.25 MG tablet TAKE 1 TABLET BY MOUTH EVERY DAY IN THE MORNING FOR BLOOD PRESSURE   [DISCONTINUED] hydrochlorothiazide (HYDRODIURIL) 12.5 MG tablet TAKE 1 TABLET BY MOUTH EVERY DAY   [DISCONTINUED] meloxicam (MOBIC) 15 MG tablet meloxicam 15 mg tablet  TAKE 1 TABLET BY MOUTH EVERY DAY WITH A MEAL (Patient not taking: Reported on 11/01/2021)   [DISCONTINUED] traZODone (DESYREL)  50 MG tablet TAKE 1-2 TABLETS BY MOUTH AT BEDTIME AS NEEDED FOR SLEEP.   Semaglutide, 1 MG/DOSE, 4 MG/3ML SOPN Inject 1 mg into the skin once a week.   [DISCONTINUED] Semaglutide, 1 MG/DOSE, 4 MG/3ML SOPN Inject 1 mg into the skin once a week.   No facility-administered encounter medications on file as of 10/10/2021.    Surgical History: Past Surgical History:  Procedure Laterality Date   CHOLECYSTECTOMY     TEE WITHOUT CARDIOVERSION N/A 11/11/2017   Procedure: TRANSESOPHAGEAL ECHOCARDIOGRAM (TEE);  Surgeon: Dalia HeadingFath, Kenneth A, MD;  Location: [facility] ORS;  Service: Cardiovascular;  Laterality: N/A;   TONSILLECTOMY AND ADENOIDECTOMY Bilateral 11/06/2021   Procedure: TONSILLECTOMY;  Surgeon: Geanie LoganBennett, Paul, MD;  Location: [facility] ORS;  Service: ENT;  Laterality: Bilateral;   TUBAL LIGATION     WISDOM TOOTH EXTRACTION      Medical History: Past Medical History:  Diagnosis Date   Anemia    Hemoglobin E-A disorder (HCC)    Hyperlipidemia    Hypertension    Migraine    Morbid obesity (HCC)    Sleep apnea    uses CPAP   Stroke [facility](HCC)     Family History: Family History  Problem Relation Age of Onset   Kidney failure Father    Heart attack Father        open heart surgery   Hypertension Father    Diabetes Father    Gout Father    Cataracts Father     Social History   Socioeconomic History   Marital status: Single  Spouse name: Not on file   Number of children: 2   Years of education: Not on file   Highest education level: High school graduate  Occupational History   Not on file  Tobacco Use   Smoking status: Former    Packs/day: 0.25    Types: Cigarettes    Quit date: 11/09/2017    Years since quitting: 4.0   Smokeless tobacco: Never   Tobacco comments:    approximately 4 cigarettes a day  Vaping Use   Vaping Use: Former  Substance and Sexual Activity   Alcohol use: Yes    Comment: every other weekend, not much    Drug use: No   Sexual activity: Yes    Birth  control/protection: Condom  Other Topics Concern   Not on file  Social History Narrative   Lives at home with her parents, brother and her children   Right handed   Drinks rare caffeine    Social Determinants of Health   Financial Resource Strain: Not on file  Food Insecurity: Not on file  Transportation Needs: Not on file  Physical Activity: Not on file  Stress: Not on file  Social Connections: Not on file  Intimate Partner Violence: Not on file      Review of Systems  Constitutional:  Negative for chills, fatigue and unexpected weight change.  HENT:  Negative for congestion, rhinorrhea, sneezing and sore throat.   Eyes:  Negative for redness.  Respiratory:  Negative for cough, chest tightness and shortness of breath.   Cardiovascular:  Negative for chest pain and palpitations.  Gastrointestinal:  Negative for abdominal pain, constipation, diarrhea, nausea and vomiting.  Genitourinary:  Negative for dysuria and frequency.  Musculoskeletal:  Negative for arthralgias, back pain, joint swelling and neck pain.  Skin:  Negative for rash.  Neurological: Negative.  Negative for tremors and numbness.  Hematological:  Negative for adenopathy. Does not bruise/bleed easily.  Psychiatric/Behavioral:  Negative for behavioral problems (Depression), sleep disturbance and suicidal ideas. The patient is not nervous/anxious.    Vital Signs: BP (!) 142/88    Pulse 99    Temp 98.4 F (36.9 C)    Resp 16    Ht 5' (1.524 m)    Wt 239 lb 3.2 oz (108.5 kg)    LMP 10/10/2021 (Exact Date)    SpO2 99%    BMI 46.72 kg/m    Physical Exam Vitals reviewed.  Constitutional:      General: She is not in acute distress.    Appearance: Normal appearance. She is obese. She is not ill-appearing.  HENT:     Head: Normocephalic and atraumatic.  Eyes:     Pupils: Pupils are equal, round, and reactive to light.  Cardiovascular:     Rate and Rhythm: Normal rate and regular rhythm.  Pulmonary:     Effort:  Pulmonary effort is normal. No respiratory distress.  Neurological:     Mental Status: She is alert and oriented to person, place, and time.     Cranial Nerves: No cranial nerve deficit.     Coordination: Coordination normal.     Gait: Gait normal.  Psychiatric:        Mood and Affect: Mood normal.        Behavior: Behavior normal.       Assessment/Plan: 1. Hepatomegaly Enlarged liver with elevated liver enzymes. Atorvastatin discontinued because it can cause elevated liver enzymes. Information provided on fatty liver disease, diet and hepatomegaly  2. Mixed hyperlipidemia  Will check her lipid panel in a few months since she will not be taking atorvastatin anymore.   3. Impaired fasting glucose Increased ozempic to 1 mg weekly to aid in weight loss.  - Semaglutide, 1 MG/DOSE, 4 MG/3ML SOPN; Inject 1 mg into the skin once a week.  Dispense: 3 mL; Refill: 3  4. Class 3 severe obesity due to excess calories with serious comorbidity and body mass index (BMI) of 45.0 to 49.9 in adult [facility]) Lost 5 lbs since previous office visit, increase ozempic dose to 1 mg weekly. Follow up in 1 month.    General Counseling: Natasha Moreno understanding of the findings of todays visit and agrees with plan of treatment. I have discussed any further diagnostic evaluation that may be needed or ordered today. We also reviewed her medications today. she has been encouraged to call the office with any questions or concerns that should arise related to todays visit.    No orders of the defined types were placed in this encounter.   Meds ordered this encounter  Medications   Semaglutide, 1 MG/DOSE, 4 MG/3ML SOPN    Sig: Inject 1 mg into the skin once a week.    Dispense:  3 mL    Refill:  3    DISCONTINUE 0.5 mg dose, PLEASE FILL OZEMPIC 1mg  dose now    Return in about 1 month (around 11/10/2021) for F/U, Weight loss, Natasha Moreno PCP.   Total time spent:30 Minutes Time spent includes review of  chart, medications, test results, and follow up plan with the patient.   [place] Controlled Substance Database was reviewed by me.  This patient was seen by 11/12/2021, FNP-C in collaboration with Dr. Sallyanne Kuster as a part of collaborative care agreement.   Kehinde Bowdish R. Beverely Risen, MSN, FNP-C Internal medicine

## 2021-10-14 ENCOUNTER — Other Ambulatory Visit: Payer: Self-pay | Admitting: Nurse Practitioner

## 2021-10-30 ENCOUNTER — Telehealth: Payer: Self-pay

## 2021-10-30 NOTE — Telephone Encounter (Signed)
Left vm to confirm 10/31/21 appointment-Toni °

## 2021-10-31 ENCOUNTER — Other Ambulatory Visit: Payer: Self-pay

## 2021-10-31 ENCOUNTER — Telehealth: Payer: Self-pay

## 2021-10-31 ENCOUNTER — Encounter: Payer: Self-pay | Admitting: Internal Medicine

## 2021-10-31 ENCOUNTER — Ambulatory Visit: Payer: Medicaid Other | Admitting: Internal Medicine

## 2021-10-31 VITALS — BP 128/84 | HR 103 | Temp 98.3°F | Resp 16 | Ht 60.0 in | Wt 239.0 lb

## 2021-10-31 DIAGNOSIS — Z7189 Other specified counseling: Secondary | ICD-10-CM | POA: Diagnosis not present

## 2021-10-31 DIAGNOSIS — J358 Other chronic diseases of tonsils and adenoids: Secondary | ICD-10-CM

## 2021-10-31 DIAGNOSIS — Z87891 Personal history of nicotine dependence: Secondary | ICD-10-CM

## 2021-10-31 DIAGNOSIS — G4733 Obstructive sleep apnea (adult) (pediatric): Secondary | ICD-10-CM | POA: Diagnosis not present

## 2021-10-31 DIAGNOSIS — K760 Fatty (change of) liver, not elsewhere classified: Secondary | ICD-10-CM

## 2021-10-31 NOTE — Patient Instructions (Signed)
Sleep Apnea ?Sleep apnea affects breathing during sleep. It causes breathing to stop for 10 seconds or more, or to become shallow. People with sleep apnea usually snore loudly. ?It can also increase the risk of: ?Heart attack. ?Stroke. ?Being very overweight (obese). ?Diabetes. ?Heart failure. ?Irregular heartbeat. ?High blood pressure. ?The goal of treatment is to help you breathe normally again. ?What are the causes? ?The most common cause of this condition is a collapsed or blocked airway. ?There are three kinds of sleep apnea: ?Obstructive sleep apnea. This is caused by a blocked or collapsed airway. ?Central sleep apnea. This happens when the brain does not send the right signals to the muscles that control breathing. ?Mixed sleep apnea. This is a combination of obstructive and central sleep apnea. ?What increases the risk? ?Being overweight. ?Smoking. ?Having a small airway. ?Being older. ?Being female. ?Drinking alcohol. ?Taking medicines to calm yourself (sedatives or tranquilizers). ?Having family members with the condition. ?Having a tongue or tonsils that are larger than normal. ?What are the signs or symptoms? ?Trouble staying asleep. ?Loud snoring. ?Headaches in the morning. ?Waking up gasping. ?Dry mouth or sore throat in the morning. ?Being sleepy or tired during the day. ?If you are sleepy or tired during the day, you may also: ?Not be able to focus your mind (concentrate). ?Forget things. ?Get angry a lot and have mood swings. ?Feel sad (depressed). ?Have changes in your personality. ?Have less interest in sex, if you are female. ?Be unable to have an erection, if you are female. ?How is this treated? ? ?Sleeping on your side. ?Using a medicine to get rid of mucus in your nose (decongestant). ?Avoiding the use of alcohol, medicines to help you relax, or certain pain medicines (narcotics). ?Losing weight, if needed. ?Changing your diet. ?Quitting smoking. ?Using a machine to open your airway while you  sleep, such as: ?An oral appliance. This is a mouthpiece that shifts your lower jaw forward. ?A CPAP device. This device blows air through a mask when you breathe out (exhale). ?An EPAP device. This has valves that you put in each nostril. ?A BIPAP device. This device blows air through a mask when you breathe in (inhale) and breathe out. ?Having surgery if other treatments do not work. ?Follow these instructions at home: ?Lifestyle ?Make changes that your doctor recommends. ?Eat a healthy diet. ?Lose weight if needed. ?Avoid alcohol, medicines to help you relax, and some pain medicines. ?Do not smoke or use any products that contain nicotine or tobacco. If you need help quitting, ask your doctor. ?General instructions ?Take over-the-counter and prescription medicines only as told by your doctor. ?If you were given a machine to use while you sleep, use it only as told by your doctor. ?If you are having surgery, make sure to tell your doctor you have sleep apnea. You may need to bring your device with you. ?Keep all follow-up visits. ?Contact a doctor if: ?The machine that you were given to use during sleep bothers you or does not seem to be working. ?You do not get better. ?You get worse. ?Get help right away if: ?Your chest hurts. ?You have trouble breathing in enough air. ?You have an uncomfortable feeling in your back, arms, or stomach. ?You have trouble talking. ?One side of your body feels weak. ?A part of your face is hanging down. ?These symptoms may be an emergency. Get help right away. Call your local emergency services (911 in the [place] ?Do not wait to see if the symptoms   will go away. ?Do not drive yourself to the hospital. ?Summary ?This condition affects breathing during sleep. ?The most common cause is a collapsed or blocked airway. ?The goal of treatment is to help you breathe normally while you sleep. ?This information is not intended to replace advice given to you by your health care provider. Make  sure you discuss any questions you have with your health care provider. ?Document Revised: 04/17/2021 Document Reviewed: 08/17/2020 ?Elsevier Patient Education ? 2022 Elsevier Inc[employer] ? ?

## 2021-10-31 NOTE — Telephone Encounter (Signed)
SS ordered. Printed. Gave to Titania-Toni ?

## 2021-10-31 NOTE — Progress Notes (Signed)
[facility] [address] [place], [place] [address]  Pulmonary Sleep Medicine   Office Visit Note  Patient Name: Natasha Moreno DOB: [birth year] MRN [id number]  Date of Service: 10/31/2021  Complaints/HPI: OSA on CPAP. Patient was diagnosed with OSA back in June of 2022. Patient was started on CPAP therapy and has done well. She states she is having tonsils removed due recurrent stones and also having infections.  States that her breathing has done okay.  Denies having any chest pain or palpitations.  She did have a history of using albuterol in the past.  In addition has a history of allergies and has used Zyrtec for the allergies.  ROS  General: (-) fever, (-) chills, (-) night sweats, (-) weakness Skin: (-) rashes, (-) itching,. Eyes: (-) visual changes, (-) redness, (-) itching. Nose and Sinuses: (-) nasal stuffiness or itchiness, (-) postnasal drip, (-) nosebleeds, (-) sinus trouble. Mouth and Throat: (-) sore throat, (-) hoarseness. Neck: (-) swollen glands, (-) enlarged thyroid, (-) neck pain. Respiratory: - cough, (-) bloody sputum, - shortness of breath, - wheezing. Cardiovascular: - ankle swelling, (-) chest pain. Lymphatic: (-) lymph node enlargement. Neurologic: (-) numbness, (-) tingling. Psychiatric: (-) anxiety, (-) depression   Current Medication: Outpatient Encounter Medications as of 10/31/2021  Medication Sig   albuterol (PROAIR HFA) 108 (90 Base) MCG/ACT inhaler TAKE 2 PUFFS BY MOUTH EVERY 6 HOURS AS NEEDED FOR WHEEZE OR SHORTNESS OF BREATH   aspirin EC 325 MG tablet Take 1 tablet (325 mg total) by mouth daily.   cetirizine (ZYRTEC) 10 MG tablet Take 10 mg by mouth daily as needed for allergies.    hydrochlorothiazide (HYDRODIURIL) 12.5 MG tablet TAKE 1 TABLET BY MOUTH EVERY DAY   meloxicam (MOBIC) 15 MG tablet meloxicam 15 mg tablet  TAKE 1 TABLET BY MOUTH EVERY DAY WITH A MEAL   Semaglutide, 1 MG/DOSE, 4 MG/3ML SOPN Inject 1 mg into the skin  once a week.   Vitamin D, Ergocalciferol, (DRISDOL) 1.25 MG (50000 UNIT) CAPS capsule Take 1 capsule (50,000 Units total) by mouth every 7 (seven) days.   [DISCONTINUED] atorvastatin (LIPITOR) 10 MG tablet Take 1 tablet (10 mg total) by mouth daily. (Patient not taking: Reported on 10/31/2021)   No facility-administered encounter medications on file as of 10/31/2021.    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HCC)    Hyperlipidemia    Hypertension    Migraine    Morbid obesity (HCC)    Stroke (HCC)     Family History: Family History  Problem Relation Age of Onset   Kidney failure Father    Heart attack Father        open heart surgery   Hypertension Father    Diabetes Father    Gout Father    Cataracts Father     Social History: Social History   Socioeconomic History   Marital status: Single    Spouse name: Not on file   Number of children: 2   Years of education: Not on file   Highest education level: High school graduate  Occupational History  Not on file  Tobacco Use   Smoking status: Former    Packs/day: 0.25    Types: Cigarettes    Quit date: 11/09/2017    Years since quitting: 3.9   Smokeless tobacco: Never   Tobacco comments:    approximately 4 cigarettes a day  Vaping Use   Vaping Use: Former  Substance and Sexual Activity   Alcohol use: Not Currently    Comment: every other weekend, not much    Drug use: No   Sexual activity: Yes    Birth control/protection: Condom  Other Topics Concern   Not on file  Social History Narrative   Lives at home with her parents, brother and her  children   Right handed   Drinks rare caffeine    Social Determinants of Health   Financial Resource Strain: Not on file  Food Insecurity: Not on file  Transportation Needs: Not on file  Physical Activity: Not on file  Stress: Not on file  Social Connections: Not on file  Intimate Partner Violence: Not on file    Vital Signs: Blood pressure 128/84, pulse (!) 103, temperature 98.3 F (36.8 C), resp. rate 16, height 5' (1.524 m), weight 239 lb (108.4 kg), SpO2 97 %.  Examination: General Appearance: The patient is well-developed, well-nourished, and in no distress. Skin: Gross inspection of skin unremarkable. Head: normocephalic, no gross deformities. Eyes: no gross deformities noted. ENT: ears appear grossly normal no exudates. Neck: Supple. No thyromegaly. No LAD. Respiratory: no rhonchi. Cardiovascular: Normal S1 and S2 without murmur or rub. Extremities: No cyanosis. pulses are equal. Neurologic: Alert and oriented. No involuntary movements.  LABS: No results found for this or any previous visit (from the past 2160 hour(s)).  Radiology: DG Chest 2 View  Result Date: 10/02/2020 CLINICAL DATA:  Cough.  Difficulty breathing. EXAM: CHEST - 2 VIEW COMPARISON:  Prior chest x-ray 05/15/2004. FINDINGS: Mediastinum and hilar structures normal. Lungs are clear. No pleural effusion or pneumothorax. Heart size normal. No pleural effusion or pneumothorax. Surgical clips upper abdomen. IMPRESSION: No acute cardiopulmonary disease. Electronically Signed   By: Maisie Fus  Register   On: 10/02/2020 13:57    No results found.  No results found.    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 OSA (obstructive sleep apnea) We will need to have a follow-up assessment done for sleep apnea.  No recent sleep study has been done so we will get that taken care of to reassess the pressure settings that will be required.  In the past she has had severe obstructive sleep apnea and recently with her tonsillar removal this may actually have improved her AHI so therefore would recommend getting a follow-up sleep study done - [facility] Sleep Study; Future  2. Obesity, morbid (HCC) Obesity Counseling: Had a lengthy discussion regarding patients BMI and weight issues. Patient was instructed on portion control as well as increased activity. Also discussed caloric restrictions with trying to maintain intake less than 2000 Kcal. Discussions were made in accordance with the 5As of weight management. Simple actions such as not eating late and if able to, taking a walk  is suggested.   3. CPAP use counseling CPAP Counseling: had a lengthy discussion with the patient regarding the importance of PAP therapy in management of the sleep apnea. Patient appears to understand the risk factor reduction and also understands the risks associated with untreated sleep apnea. Patient will try to make a good faith effort to remain compliant with therapy. Also instructed the patient on proper cleaning of the device including the water must be changed daily if possible and use of distilled water is preferred. Patient understands that the machine should be regularly cleaned with appropriate recommended cleaning solutions that do not damage the PAP machine for example given white vinegar and water rinses. Other methods such as ozone treatment may not be as good as these simple methods to achieve cleaning.   4. Tonsillolith We will follow-up with her ENT  5. Former smoker Smoking cessation instruction/counseling given:  commended patient for quitting and  reviewed strategies for preventing relapses   6. Fatty liver Suggest an ultrasound of the liver to be done but she can follow-up with her primary care team  General Counseling: I have discussed the findings of the evaluation and examination with Nicole Cella.  I have also discussed any further diagnostic evaluation thatmay be needed or ordered today. Isamar verbalizes understanding of the findings of todays visit. We also reviewed her medications today and discussed drug interactions and side effects including but not limited excessive drowsiness and altered mental states. We also discussed that there is always a risk not just to her but also people around her. she has been encouraged to call the office with any questions or concerns that should arise related to todays visit.  No orders of the defined types were placed in this encounter.    Time spent: 90  I have personally obtained a history, examined the patient, evaluated laboratory and imaging results, formulated the assessment and plan and placed orders.    Yevonne Pax, MD [facility] Pulmonary and Critical Care Sleep medicine

## 2021-11-01 ENCOUNTER — Other Ambulatory Visit: Payer: Self-pay

## 2021-11-01 ENCOUNTER — Other Ambulatory Visit
Admission: RE | Admit: 2021-11-01 | Discharge: 2021-11-01 | Disposition: A | Discharge status: Home / Self Care | Payer: Medicaid Other | Source: Ambulatory Visit | Attending: Otolaryngology | Admitting: Otolaryngology

## 2021-11-01 DIAGNOSIS — Z01812 Encounter for preprocedural laboratory examination: Secondary | ICD-10-CM

## 2021-11-01 HISTORY — DX: Sleep apnea, unspecified: G47.30

## 2021-11-01 HISTORY — DX: Anemia, unspecified: D64.9

## 2021-11-01 NOTE — Patient Instructions (Addendum)
Your procedure is scheduled on: 11/06/21 - Wednesday Report to the Registration Desk on the 1st floor of the [facility]. To find out your arrival time, please call [phone] between 1PM - 3PM on: 11/05/21 - Tuesday [facility]/EKG on 11/04/21 at 1130 am.  REMEMBER: Instructions that are not followed completely may result in serious medical risk, up to and including death; or upon the discretion of your surgeon and anesthesiologist your surgery may need to be rescheduled.  Do not eat food after midnight the night before surgery.  No gum chewing, lozengers or hard candies.  You may however, drink CLEAR liquids up to 2 hours before you are scheduled to arrive for your surgery. Do not drink anything within 2 hours of your scheduled arrival time.  Clear liquids include: - water  - apple juice without pulp - gatorade (not RED, PURPLE, OR BLUE) - black coffee or tea (Do NOT add milk or creamers to the coffee or tea) Do NOT drink anything that is not on this list.  TAKE THESE MEDICATIONS THE MORNING OF SURGERY WITH A SIP OF WATER: NONE  Use albuterol (PROAIR HFA) 108 (90 Base) MCG/ACT inhaler on the day of surgery and bring to the hospital.  Follow recommendations from Cardiologist, Pulmonologist or PCP regarding stopping Aspirin, Coumadin, Plavix, Eliquis, Pradaxa, or Pletal. Patient reports that she is not currently taking Aspirin .  One week prior to surgery: meloxicam (MOBIC) 15 MG tablet Stop Anti-inflammatories (NSAIDS) such as Advil, Aleve, Ibuprofen, Motrin, Naproxen, Naprosyn and Aspirin based products such as Excedrin, Goodys Powder, BC Powder.  Stop ANY OVER THE COUNTER supplements until after surgery.  You may take Tylenol if needed for pain up until the day of surgery.  No Alcohol for 24 hours before or after surgery.  No Smoking including e-cigarettes for 24 hours prior to surgery.  No chewable tobacco products for at least 6 hours prior to  surgery.  No nicotine patches on the day of surgery.  Do not use any "recreational" drugs for at least a week prior to your surgery.  Please be advised that the combination of cocaine and anesthesia may have negative outcomes, up to and including death. If you test positive for cocaine, your surgery will be cancelled.  On the morning of surgery brush your teeth with toothpaste and water, you may rinse your mouth with mouthwash if you wish. Do not swallow any toothpaste or mouthwash.  Do not wear jewelry, make-up, hairpins, clips or nail polish.  Do not wear lotions, powders, or perfumes.   Do not shave body from the neck down 48 hours prior to surgery just in case you cut yourself which could leave a site for infection.  Also, freshly shaved skin may become irritated if using the CHG soap.  Contact lenses, hearing aids and dentures may not be worn into surgery.  Do not bring valuables to the hospital. [facility] is not responsible for any missing/lost belongings or valuables.   Bring your C-PAP to the hospital with you in case you may have to spend the night.   Notify your doctor if there is any change in your medical condition (cold, fever, infection).  Wear comfortable clothing (specific to your surgery type) to the hospital.  After surgery, you can help prevent lung complications by doing breathing exercises.  Take deep breaths and cough every 1-2 hours. Your doctor may order a device called an Incentive Spirometer to help you take deep breaths.  When coughing or sneezing, hold a pillow firmly against your incision with both hands. This is called splinting. Doing this helps protect your incision. It also decreases belly discomfort.  If you are being admitted to the hospital overnight, leave your suitcase in the car. After surgery it may be brought to your room.  If you are being discharged the day of surgery, you will not be allowed to drive home. You will need a responsible  adult (18 years or older) to drive you home and stay with you that night.   If you are taking public transportation, you will need to have a responsible adult (18 years or older) with you. Please confirm with your physician that it is acceptable to use public transportation.   Please call the Pre-admissions Testing Dept. at [phone] if you have any questions about these instructions.  Surgery Visitation Policy:  Patients undergoing a surgery or procedure may have one family member or support person with them as long as that person is not COVID-19 positive or experiencing its symptoms.  That person may remain in the waiting area during the procedure and may rotate out with other people.  Inpatient Visitation:    Visiting hours are 7 a.m. to 8 p.m. Up to two visitors ages 16+ are allowed at one time in a patient room. The visitors may rotate out with other people during the day. Visitors must check out when they leave, or other visitors will not be allowed. One designated support person may remain overnight. The visitor must pass COVID-19 screenings, use hand sanitizer when entering and exiting the patients room and wear a mask at all times, including in the patients room. Patients must also wear a mask when staff or their visitor are in the room. Masking is required regardless of vaccination status.

## 2021-11-04 ENCOUNTER — Encounter: Payer: Self-pay | Admitting: Urgent Care

## 2021-11-04 ENCOUNTER — Other Ambulatory Visit: Payer: Self-pay

## 2021-11-04 ENCOUNTER — Other Ambulatory Visit
Admission: RE | Admit: 2021-11-04 | Discharge: 2021-11-04 | Disposition: A | Discharge status: Home / Self Care | Payer: Medicaid Other | Source: Ambulatory Visit | Attending: Otolaryngology | Admitting: Otolaryngology

## 2021-11-04 DIAGNOSIS — Z01812 Encounter for preprocedural laboratory examination: Secondary | ICD-10-CM

## 2021-11-04 DIAGNOSIS — Z01818 Encounter for other preprocedural examination: Secondary | ICD-10-CM | POA: Diagnosis not present

## 2021-11-04 LAB — BASIC METABOLIC PANEL
Anion gap: 8 (ref 5–15)
BUN: 16 mg/dL (ref 6–20)
CO2: 25 mmol/L (ref 22–32)
Calcium: 9.1 mg/dL (ref 8.9–10.3)
Chloride: 103 mmol/L (ref 98–111)
Creatinine, Ser: 0.43 mg/dL — ABNORMAL LOW (ref 0.44–1.00)
GFR, Estimated: >60 mL/min (ref >60)
Glucose, Bld: 76 mg/dL (ref 70–99)
Potassium: 3.6 mmol/L (ref 3.5–5.1)
Sodium: 136 mmol/L (ref 135–145)

## 2021-11-04 LAB — CBC
HCT: 34.2 % — ABNORMAL LOW (ref 36.0–46.0)
Hemoglobin: 10.7 g/dL — ABNORMAL LOW (ref 12.0–15.0)
MCH: 16.6 pg — ABNORMAL LOW (ref 26.0–34.0)
MCHC: 31.3 g/dL (ref 30.0–36.0)
MCV: 53.1 fL — ABNORMAL LOW (ref 80.0–100.0)
Platelets: 355 10*3/uL (ref 150–400)
RBC: 6.44 MIL/uL — ABNORMAL HIGH (ref 3.87–5.11)
RDW: 22.4 % — ABNORMAL HIGH (ref 11.5–15.5)
WBC: 9.4 10*3/uL (ref 4.0–10.5)
nRBC: 0 % (ref 0.0–0.2)

## 2021-11-06 ENCOUNTER — Ambulatory Visit
Admission: RE | Admit: 2021-11-06 | Discharge: 2021-11-06 | Disposition: A | Discharge status: Home / Self Care | Payer: Medicaid Other | Source: Ambulatory Visit | Attending: Otolaryngology | Admitting: Otolaryngology

## 2021-11-06 ENCOUNTER — Other Ambulatory Visit: Payer: Self-pay

## 2021-11-06 ENCOUNTER — Encounter
Admission: RE | Disposition: A | Discharge status: Home / Self Care | Payer: Self-pay | Source: Ambulatory Visit | Attending: Otolaryngology

## 2021-11-06 ENCOUNTER — Encounter: Payer: Self-pay | Admitting: Otolaryngology

## 2021-11-06 ENCOUNTER — Ambulatory Visit: Payer: Medicaid Other | Admitting: Certified Registered"

## 2021-11-06 DIAGNOSIS — Z87891 Personal history of nicotine dependence: Secondary | ICD-10-CM | POA: Insufficient documentation

## 2021-11-06 DIAGNOSIS — J358 Other chronic diseases of tonsils and adenoids: Secondary | ICD-10-CM | POA: Diagnosis not present

## 2021-11-06 DIAGNOSIS — J351 Hypertrophy of tonsils: Secondary | ICD-10-CM | POA: Diagnosis not present

## 2021-11-06 DIAGNOSIS — G4730 Sleep apnea, unspecified: Secondary | ICD-10-CM | POA: Insufficient documentation

## 2021-11-06 DIAGNOSIS — Z8673 Personal history of transient ischemic attack (TIA), and cerebral infarction without residual deficits: Secondary | ICD-10-CM | POA: Diagnosis not present

## 2021-11-06 HISTORY — PX: TONSILLECTOMY AND ADENOIDECTOMY: SHX28

## 2021-11-06 LAB — POCT PREGNANCY, URINE: Preg Test, Ur: NEGATIVE

## 2021-11-06 SURGERY — TONSILLECTOMY AND ADENOIDECTOMY
Anesthesia: General | Site: Throat | Laterality: Bilateral

## 2021-11-06 MED ORDER — ORAL CARE MOUTH RINSE: 15.0000 mL | Freq: Once | OROMUCOSAL | Status: AC

## 2021-11-06 MED ORDER — FAMOTIDINE 20 MG PO TABS
ORAL_TABLET | ORAL | Status: AC
  Filled 2021-11-06: qty 1

## 2021-11-06 MED ORDER — FENTANYL CITRATE (PF) 100 MCG/2ML IJ SOLN
INTRAMUSCULAR | Status: AC
  Filled 2021-11-06: qty 2

## 2021-11-06 MED ORDER — SUCCINYLCHOLINE CHLORIDE 200 MG/10ML IV SOSY
PREFILLED_SYRINGE | INTRAVENOUS | Status: DC | PRN
  Administered 2021-11-06: 120 mg via INTRAVENOUS

## 2021-11-06 MED ORDER — MIDAZOLAM HCL 2 MG/2ML IJ SOLN
INTRAMUSCULAR | Status: DC | PRN
  Administered 2021-11-06: 2 mg via INTRAVENOUS

## 2021-11-06 MED ORDER — ACETAMINOPHEN 10 MG/ML IV SOLN
INTRAVENOUS | Status: AC
  Filled 2021-11-06: qty 100

## 2021-11-06 MED ORDER — FAMOTIDINE 20 MG PO TABS
20.0000 mg | ORAL_TABLET | Freq: Once | ORAL | Status: AC
  Administered 2021-11-06: 20 mg via ORAL

## 2021-11-06 MED ORDER — PREDNISOLONE SODIUM PHOSPHATE 15 MG/5ML PO SOLN: ORAL | 0 refills | Status: DC

## 2021-11-06 MED ORDER — OXYMETAZOLINE HCL 0.05 % NA SOLN
NASAL | Status: DC | PRN
Start: 2021-11-06 — End: 2021-11-06
  Administered 2021-11-06: 1 via TOPICAL

## 2021-11-06 MED ORDER — OXYMETAZOLINE HCL 0.05 % NA SOLN
NASAL | Status: AC
  Filled 2021-11-06: qty 30

## 2021-11-06 MED ORDER — BUPIVACAINE-EPINEPHRINE (PF) 0.25% -1:200000 IJ SOLN
INTRAMUSCULAR | Status: DC | PRN
  Administered 2021-11-06: 3 mL

## 2021-11-06 MED ORDER — LACTATED RINGERS IV SOLN: INTRAVENOUS | Status: DC

## 2021-11-06 MED ORDER — FENTANYL CITRATE (PF) 100 MCG/2ML IJ SOLN
INTRAMUSCULAR | Status: DC | PRN
  Administered 2021-11-06 (×2): 50 ug via INTRAVENOUS

## 2021-11-06 MED ORDER — FENTANYL CITRATE (PF) 100 MCG/2ML IJ SOLN
INTRAMUSCULAR | Status: AC
  Administered 2021-11-06: 50 ug via INTRAVENOUS
  Filled 2021-11-06: qty 2

## 2021-11-06 MED ORDER — ESMOLOL HCL 100 MG/10ML IV SOLN
INTRAVENOUS | Status: DC | PRN
  Administered 2021-11-06: 10 mg via INTRAVENOUS

## 2021-11-06 MED ORDER — 0.9 % SODIUM CHLORIDE (POUR BTL) OPTIME
TOPICAL | Status: DC | PRN
  Administered 2021-11-06: 500 mL

## 2021-11-06 MED ORDER — ONDANSETRON HCL 4 MG/2ML IJ SOLN
INTRAMUSCULAR | Status: AC
  Filled 2021-11-06: qty 2

## 2021-11-06 MED ORDER — ONDANSETRON HCL 4 MG/2ML IJ SOLN: 4.0000 mg | Freq: Once | INTRAMUSCULAR | Status: DC | PRN

## 2021-11-06 MED ORDER — FENTANYL CITRATE (PF) 100 MCG/2ML IJ SOLN
25.0000 ug | INTRAMUSCULAR | Status: DC | PRN
  Administered 2021-11-06 (×2): 50 ug via INTRAVENOUS

## 2021-11-06 MED ORDER — MIDAZOLAM HCL 2 MG/2ML IJ SOLN
INTRAMUSCULAR | Status: AC
  Filled 2021-11-06: qty 2

## 2021-11-06 MED ORDER — HYDROCODONE-ACETAMINOPHEN 7.5-325 MG/15ML PO SOLN: ORAL | 0 refills | Status: DC

## 2021-11-06 MED ORDER — OXYCODONE HCL 5 MG/5ML PO SOLN
ORAL | Status: AC
  Administered 2021-11-06: 5 mg via ORAL
  Filled 2021-11-06: qty 5

## 2021-11-06 MED ORDER — ACETAMINOPHEN 10 MG/ML IV SOLN
1000.0000 mg | Freq: Once | INTRAVENOUS | Status: DC | PRN
  Administered 2021-11-06: 1000 mg via INTRAVENOUS

## 2021-11-06 MED ORDER — OXYCODONE HCL 5 MG/5ML PO SOLN: 5.0000 mg | Freq: Once | ORAL | Status: AC | PRN

## 2021-11-06 MED ORDER — LIDOCAINE HCL (CARDIAC) PF 100 MG/5ML IV SOSY
PREFILLED_SYRINGE | INTRAVENOUS | Status: DC | PRN
  Administered 2021-11-06: 100 mg via INTRAVENOUS

## 2021-11-06 MED ORDER — PROPOFOL 10 MG/ML IV BOLUS
INTRAVENOUS | Status: DC | PRN
Start: 2021-11-06 — End: 2021-11-06
  Administered 2021-11-06: 200 mg via INTRAVENOUS

## 2021-11-06 MED ORDER — LIDOCAINE HCL (PF) 2 % IJ SOLN
INTRAMUSCULAR | Status: AC
  Filled 2021-11-06: qty 5

## 2021-11-06 MED ORDER — CHLORHEXIDINE GLUCONATE 0.12 % MT SOLN
OROMUCOSAL | Status: AC
  Filled 2021-11-06: qty 15

## 2021-11-06 MED ORDER — OXYCODONE HCL 5 MG PO TABS: 5.0000 mg | ORAL_TABLET | Freq: Once | ORAL | Status: AC | PRN

## 2021-11-06 MED ORDER — BUPIVACAINE-EPINEPHRINE (PF) 0.25% -1:200000 IJ SOLN
INTRAMUSCULAR | Status: AC
  Filled 2021-11-06: qty 30

## 2021-11-06 MED ORDER — CHLORHEXIDINE GLUCONATE 0.12 % MT SOLN
15.0000 mL | Freq: Once | OROMUCOSAL | Status: AC
  Administered 2021-11-06: 15 mL via OROMUCOSAL

## 2021-11-06 MED ORDER — ONDANSETRON HCL 4 MG/2ML IJ SOLN
INTRAMUSCULAR | Status: DC | PRN
  Administered 2021-11-06: 4 mg via INTRAVENOUS

## 2021-11-06 SURGICAL SUPPLY — 17 items
CATH ROBINSON RED A/P 10FR (CATHETERS) ×2 IMPLANT
COAG SUCT 10F 3.5MM HAND CTRL (MISCELLANEOUS) ×2 IMPLANT
DEFOGGER ANTIFOG KIT (MISCELLANEOUS) ×2 IMPLANT
ELECT REM PT RETURN 9FT ADLT (ELECTROSURGICAL) ×2
ELECTRODE REM PT RTRN 9FT ADLT (ELECTROSURGICAL) ×1 IMPLANT
GAUZE 4X4 16PLY ~~LOC~~+RFID DBL (SPONGE) ×2 IMPLANT
GLOVE SURG ENC MOIS LTX SZ7.5 (GLOVE) ×2 IMPLANT
GOWN STRL REUS W/ TWL LRG LVL3 (GOWN DISPOSABLE) ×2 IMPLANT
GOWN STRL REUS W/TWL LRG LVL3 (GOWN DISPOSABLE) ×4
HANDLE SUCTION POOLE (INSTRUMENTS) ×1 IMPLANT
KIT TURNOVER KIT A (KITS) ×2 IMPLANT
LABEL OR SOLS (LABEL) ×2 IMPLANT
MANIFOLD NEPTUNE II (INSTRUMENTS) ×2 IMPLANT
NS IRRIG 500ML POUR BTL (IV SOLUTION) ×2 IMPLANT
PACK HEAD/NECK (MISCELLANEOUS) ×2 IMPLANT
SPONGE TONSIL 1 RF SGL (DISPOSABLE) ×2 IMPLANT
SUCTION POOLE HANDLE (INSTRUMENTS) ×2

## 2021-11-06 NOTE — Anesthesia Postprocedure Evaluation (Signed)
Anesthesia Post Note  Patient: Natasha Moreno  Procedure(s) Performed: TONSILLECTOMY (Bilateral: Throat)  Patient location during evaluation: PACU Anesthesia Type: General Level of consciousness: awake and alert, oriented and patient cooperative Pain management: pain level controlled Vital Signs Assessment: post-procedure vital signs reviewed and stable Respiratory status: spontaneous breathing, nonlabored ventilation and respiratory function stable Cardiovascular status: blood pressure returned to baseline and stable Postop Assessment: adequate PO intake Anesthetic complications: no   No notable events documented.   Last Vitals:  Vitals:   11/06/21 1214 11/06/21 1224  BP: 110/81 115/83  Pulse: 84 80  Resp: 20 17  Temp:  (!) 36.2 C  SpO2: 93% 93%    Last Pain:  Vitals:   11/06/21 1224  TempSrc: Temporal  PainSc: Mucarabones

## 2021-11-06 NOTE — Anesthesia Preprocedure Evaluation (Addendum)
Anesthesia Evaluation  Patient identified by MRN, date of birth, ID band Patient awake    Reviewed: Allergy & Precautions, NPO status , Patient's Chart, lab work & pertinent test results  History of Anesthesia Complications Negative for: history of anesthetic complications  Airway Mallampati: I   Neck ROM: Full    Dental no notable dental hx.    Pulmonary sleep apnea and Continuous Positive Airway Pressure Ventilation , former smoker (quit 2019),    Pulmonary exam normal breath sounds clear to auscultation       Cardiovascular hypertension, Normal cardiovascular exam Rhythm:Regular Rate:Normal  ECG 11/04/21:  Normal sinus rhythm Low voltage QRS   Neuro/Psych CVA (10/2017, no residual symptoms)    GI/Hepatic GERD  ,  Endo/Other  Class 3 obesity  Renal/GU negative Renal ROS     Musculoskeletal  (+) Arthritis ,   Abdominal   Peds  Hematology  (+) Blood dyscrasia, anemia ,   Anesthesia Other Findings   Reproductive/Obstetrics                            Anesthesia Physical Anesthesia Plan  ASA: 3  Anesthesia Plan: General   Post-op Pain Management:    Induction: Intravenous  PONV Risk Score and Plan: 3 and Ondansetron, Dexamethasone and Treatment may vary due to age or medical condition  Airway Management Planned: Oral ETT  Additional Equipment:   Intra-op Plan:   Post-operative Plan: Extubation in OR  Informed Consent: I have reviewed the patients History and Physical, chart, labs and discussed the procedure including the risks, benefits and alternatives for the proposed anesthesia with the patient or authorized representative who has indicated his/her understanding and acceptance.     Dental advisory given  Plan Discussed with: CRNA  Anesthesia Plan Comments: (Patient consented for risks of anesthesia including but not limited to:  - adverse reactions to medications -  damage to eyes, teeth, lips or other oral mucosa - nerve damage due to positioning  - sore throat or hoarseness - damage to heart, brain, nerves, lungs, other parts of body or loss of life  Informed patient about role of CRNA in peri- and intra-operative care.  Patient voiced understanding.)        Anesthesia Quick Evaluation

## 2021-11-06 NOTE — Discharge Instructions (Signed)

## 2021-11-06 NOTE — Anesthesia Procedure Notes (Signed)
Procedure Name: Intubation Date/Time: 11/06/2021 9:52 AM Performed by: Philbert Riser, CRNA Pre-anesthesia Checklist: Patient identified, Patient being monitored, Timeout performed, Emergency Drugs available and Suction available Patient Re-evaluated:Patient Re-evaluated prior to induction Oxygen Delivery Method: Circle system utilized Preoxygenation: Pre-oxygenation with 100% oxygen Induction Type: IV induction Ventilation: Mask ventilation without difficulty Laryngoscope Size: 3 and McGraph Grade View: Grade II Tube type: Oral Tube size: 7.0 mm Number of attempts: 1 Airway Equipment and Method: Stylet Placement Confirmation: ETT inserted through vocal cords under direct vision, positive ETCO2 and breath sounds checked- equal and bilateral Secured at: 21 cm Tube secured with: Tape Dental Injury: Teeth and Oropharynx as per pre-operative assessment

## 2021-11-06 NOTE — Transfer of Care (Addendum)
Immediate Anesthesia Transfer of Care Note  Patient: Natasha Moreno  Procedure(s) Performed: TONSILLECTOMY (Bilateral: Throat)  Patient Location: PACU  Anesthesia Type:General  Level of Consciousness: drowsy and responds to stimulation  Airway & Oxygen Therapy: Patient Spontanous Breathing and Patient connected to face mask oxygen  Post-op Assessment: Report given to RN, Post -op Vital signs reviewed and stable and Patient moving all extremities X 4  Post vital signs: Reviewed and stable  Last Vitals:  Vitals Value Taken Time  BP 114/82 11/06/21 1045  Temp    Pulse 98 11/06/21 1045  Resp 17 11/06/21 1045  SpO2 100 % 11/06/21 1045  Vitals shown include unvalidated device data.  Last Pain:  Vitals:   11/06/21 0848  TempSrc: Temporal  PainSc: 0-No pain         Complications: No notable events documented.

## 2021-11-06 NOTE — H&P (Signed)
History and physical reviewed and will be scanned in later. No change in medical status reported by the patient or family, appears stable for surgery. All questions regarding the procedure answered, and patient (or family if a child) expressed understanding of the procedure. ? ?Natasha Moreno S Aizik Reh ?@TODAY@ ?

## 2021-11-06 NOTE — Op Note (Signed)
11/06/2021  10:37 AM    Natasha Moreno  [id number]   Pre-Op Diagnosis:  Tonsil stones, hypertrophy of tonsils  Post-op Diagnosis: Tonsil stones, hypertrophy of tonsils  Procedure: Tonsillectomy  Surgeon:  Riley Nearing., MD  Anesthesia:  General endotracheal  EBL:  Less than 25 cc  Complications:  None  Findings: 3+ cryptic tonsils  Procedure: The patient was taken to the Operating Room and placed in the supine position.  After induction of general endotracheal anesthesia, the table was turned 90 degrees and the patient was draped in the usual fashion  with the eyes protected.  A mouth gag was inserted into the oral cavity to open the mouth, and examination of the oropharynx showed the uvula was non-bifid. The palate was palpated, and there was no evidence of submucous cleft. Examination of the nasopharynx showed no obstructing adenoids. The right tonsil was grasped with an Allis clamp and resected from the tonsillar fossa in the usual fashion with the Bovie. The left tonsil was resected in the same fashion. The Bovie was used to obtain hemostasis. Each tonsillar fossa was then carefully injected with 0.25% marcaine with epinephrine, 1:200,000, avoiding intravascular injection. The nose and throat were irrigated and suctioned to remove any  blood clot. The mouth gag was  removed with no evidence of active bleeding.  The patient was then returned to the anesthesiologist for awakening, and was taken to the Recovery Room in stable condition.  Cultures:  None.  Specimens:  Tonsils.  Disposition:   PACU to home  Plan: Soft, bland diet and push fluids. Take pain medications and prednisone as prescribed. No strenuous activity for 2 weeks. Follow-up in 3 weeks.  Riley Nearing 11/06/2021 10:37 AM

## 2021-11-07 ENCOUNTER — Encounter: Payer: Self-pay | Admitting: Otolaryngology

## 2021-11-07 LAB — SURGICAL PATHOLOGY

## 2021-11-09 ENCOUNTER — Encounter: Payer: Self-pay | Admitting: Nurse Practitioner

## 2021-11-09 MED ORDER — SEMAGLUTIDE (1 MG/DOSE) 4 MG/3ML ~~LOC~~ SOPN: 1.0000 mg | PEN_INJECTOR | SUBCUTANEOUS | 3 refills | Status: DC

## 2021-11-14 ENCOUNTER — Encounter: Payer: Self-pay | Admitting: Nurse Practitioner

## 2021-11-14 ENCOUNTER — Ambulatory Visit: Payer: Medicaid Other | Admitting: Nurse Practitioner

## 2021-11-14 ENCOUNTER — Other Ambulatory Visit: Payer: Self-pay

## 2021-11-14 VITALS — BP 128/90 | HR 100 | Temp 98.3°F | Resp 16 | Ht 60.0 in | Wt 225.2 lb

## 2021-11-14 DIAGNOSIS — I10 Essential (primary) hypertension: Secondary | ICD-10-CM

## 2021-11-14 DIAGNOSIS — Z9989 Dependence on other enabling machines and devices: Secondary | ICD-10-CM

## 2021-11-14 DIAGNOSIS — Z6841 Body Mass Index (BMI) 40.0 and over, adult: Secondary | ICD-10-CM

## 2021-11-14 DIAGNOSIS — G4733 Obstructive sleep apnea (adult) (pediatric): Secondary | ICD-10-CM | POA: Diagnosis not present

## 2021-11-14 DIAGNOSIS — J358 Other chronic diseases of tonsils and adenoids: Secondary | ICD-10-CM | POA: Diagnosis not present

## 2021-11-14 DIAGNOSIS — K760 Fatty (change of) liver, not elsewhere classified: Secondary | ICD-10-CM | POA: Diagnosis not present

## 2021-11-14 NOTE — Progress Notes (Signed)
[facility] [address] [place], [place] [address]  Internal MEDICINE  Office Visit Note  Patient Name: Natasha Moreno  [id number]  [id number]  Date of Service: 11/14/2021  Chief Complaint  Patient presents with   Follow-up    Discuss cpap   Medical Management of Chronic Issues    Weight management    Hyperlipidemia   Hypertension   Anemia   Weight Loss    HPI Natasha Moreno presents for a follow-up visit for weight loss management, hypertension and obstructive sleep apnea.  Her blood pressure is stable.  She has lost 14 pounds since her previous office visit.  She had a tonsillectomy 8 days ago and is doing well but throat is still very sore.  She received her CPAP in November and needs to have an office visit with Tresa Endo to do her CPAP download by the end of the month.   Current Medication: Outpatient Encounter Medications as of 11/14/2021  Medication Sig   albuterol (PROAIR HFA) 108 (90 Base) MCG/ACT inhaler TAKE 2 PUFFS BY MOUTH EVERY 6 HOURS AS NEEDED FOR WHEEZE OR SHORTNESS OF BREATH   cetirizine (ZYRTEC) 10 MG tablet Take 10 mg by mouth daily as needed for allergies.    hydrochlorothiazide (HYDRODIURIL) 12.5 MG tablet TAKE 1 TABLET BY MOUTH EVERY DAY   HYDROcodone-acetaminophen (HYCET) 7.5-325 mg/15 ml solution 10-15 cc PO every 4-6 hours as needed for pain   prednisoLONE (ORAPRED) 15 MG/5ML solution 10 cc PO BID x 3 days, then 5 cc PO BID x 3 days, then 5 cc PO QD x 3 days   Semaglutide, 1 MG/DOSE, 4 MG/3ML SOPN Inject 1 mg into the skin once a week.   Vitamin D, Ergocalciferol, (DRISDOL) 1.25 MG (50000 UNIT) CAPS capsule Take 1 capsule (50,000 Units total) by mouth every 7 (seven) days.   No facility-administered encounter medications on file as of 11/14/2021.    Surgical History: Past Surgical History:  Procedure Laterality Date   CHOLECYSTECTOMY     TEE WITHOUT CARDIOVERSION N/A 11/11/2017   Procedure: TRANSESOPHAGEAL ECHOCARDIOGRAM (TEE);  Surgeon: Dalia Heading, MD;  Location: [facility] ORS;  Service: Cardiovascular;  Laterality: N/A;   TONSILLECTOMY AND ADENOIDECTOMY Bilateral 11/06/2021   Procedure: TONSILLECTOMY;  Surgeon: Geanie Logan, MD;  Location: [facility] ORS;  Service: ENT;  Laterality: Bilateral;   TUBAL LIGATION     WISDOM TOOTH EXTRACTION      Medical History: Past Medical History:  Diagnosis Date   Anemia    Hemoglobin E-A disorder (HCC)    Hyperlipidemia    Hypertension    Migraine    Morbid obesity (HCC)    Sleep apnea    uses CPAP   Stroke [facility])     Family History: Family History  Problem Relation Age of Onset   Kidney failure Father    Heart attack Father        open heart surgery   Hypertension Father    Diabetes Father    Gout Father    Cataracts Father     Social History   Socioeconomic History   Marital status: Single    Spouse name: Not on file   Number of children: 2   Years of education: Not on file   Highest education level: High school graduate  Occupational History   Not on file  Tobacco Use   Smoking status: Former    Packs/day: 0.25    Types: Cigarettes    Quit date: 11/09/2017    Years since  quitting: 4.0   Smokeless tobacco: Never   Tobacco comments:    approximately 4 cigarettes a day  Vaping Use   Vaping Use: Former  Substance and Sexual Activity   Alcohol use: Yes    Comment: every other weekend, not much    Drug use: No   Sexual activity: Yes    Birth control/protection: Condom  Other Topics Concern   Not on file  Social History Narrative   Lives at home with her parents, brother and her children   Right handed   Drinks rare caffeine    Social Determinants of Health   Financial Resource Strain: Not on file  Food Insecurity: Not on file  Transportation Needs: Not on file  Physical Activity: Not on file  Stress: Not on file  Social Connections: Not on file  Intimate Partner Violence: Not on file      Review of Systems  Constitutional:  Negative for chills,  fatigue and unexpected weight change.  HENT:  Positive for sore throat (S/P tonsillectomy 8 days ago, and is an expected symptom.). Negative for congestion, rhinorrhea and sneezing.   Eyes:  Negative for redness.  Respiratory:  Negative for cough, chest tightness and shortness of breath.   Cardiovascular: Negative.  Negative for chest pain and palpitations.  Gastrointestinal: Negative.  Negative for abdominal pain, constipation, diarrhea, nausea and vomiting.  Genitourinary:  Negative for dysuria and frequency.  Musculoskeletal:  Negative for arthralgias, back pain, joint swelling and neck pain.  Skin:  Negative for rash.  Neurological: Negative.  Negative for tremors and numbness.  Hematological:  Negative for adenopathy. Does not bruise/bleed easily.  Psychiatric/Behavioral:  Negative for behavioral problems (Depression), self-injury, sleep disturbance and suicidal ideas. The patient is not nervous/anxious.    Vital Signs: BP 128/90    Pulse 100    Temp 98.3 F (36.8 C)    Resp 16    Ht 5' (1.524 m)    Wt 225 lb 3.2 oz (102.2 kg)    LMP 11/06/2021 (Exact Date) Comment: Urine pregnancy test 11/06/2021 negative   SpO2 98%    BMI 43.98 kg/m    Physical Exam Vitals reviewed.  Constitutional:      General: She is not in acute distress.    Appearance: Normal appearance. She is obese. She is not ill-appearing.  HENT:     Head: Normocephalic and atraumatic.  Eyes:     Pupils: Pupils are equal, round, and reactive to light.  Cardiovascular:     Rate and Rhythm: Normal rate and regular rhythm.  Pulmonary:     Effort: Pulmonary effort is normal. No respiratory distress.  Neurological:     Mental Status: She is alert and oriented to person, place, and time.  Psychiatric:        Mood and Affect: Mood normal.        Behavior: Behavior normal.       Assessment/Plan: 1. OSA on CPAP Been doing well on her CPAP, she needs an appointment with Tresa Endo to do her CPAP download and then have her  pulmonary visit to discuss CPAP compliance for insurance purposes.  2. Essential hypertension Her blood pressure is stable on current medications.  3. Tonsillolith History of tonsil stones, patient had tonsillectomy approximately 8 days ago and is doing well status post surgery.  4. Fatty liver Fatty liver and hepatomegaly, patient was taken off statin therapy at her previous office visit due to elevated liver enzymes and enlarged.  5. Class 3 severe obesity  due to excess calories with serious comorbidity and body mass index (BMI) of 40.0 to 44.9 in adult [facility]) Patient has lost 14 pounds and this can be partially attributed to having recent surgery.  She is also on Ozempic 1 mg weekly.    General Counseling: shantea poulton understanding of the findings of todays visit and agrees with plan of treatment. I have discussed any further diagnostic evaluation that may be needed or ordered today. We also reviewed her medications today. she has been encouraged to call the office with any questions or concerns that should arise related to todays visit.    No orders of the defined types were placed in this encounter.   No orders of the defined types were placed in this encounter.   Return for please schedule CPAP download with kelly and appt with me for next week. .   Total time spent:30 Minutes Time spent includes review of chart, medications, test results, and follow up plan with the patient.   [place] Controlled Substance Database was reviewed by me.  This patient was seen by Sallyanne Kuster, FNP-C in collaboration with Dr. Beverely Risen as a part of collaborative care agreement.   Sharolyn Weber R. Tedd Sias, MSN, FNP-C Internal medicine

## 2021-11-16 ENCOUNTER — Encounter: Payer: Self-pay | Admitting: Nurse Practitioner

## 2021-11-20 ENCOUNTER — Ambulatory Visit: Payer: Medicaid Other

## 2021-11-20 ENCOUNTER — Other Ambulatory Visit: Payer: Self-pay

## 2021-11-20 DIAGNOSIS — G4733 Obstructive sleep apnea (adult) (pediatric): Secondary | ICD-10-CM | POA: Diagnosis not present

## 2021-11-20 NOTE — Progress Notes (Signed)
95 percentile pressure 7 ? ? 95th percentile leak 7.1 ? ? apnea index 0.1 /hr ? apnea-hypopnea index  0.1 /hr ? ? total days used  >4 hr 58 days ? total days used <4 hr 5 days ? ?Total compliance 92 percent ? ?She has not been able to wear in last 2 weeks due to tonsil surgery, she goes back 11-21-21 for follow up from surgery will get back on cpap as soon as dr allows. No other question or problems at this time.   ?Pt was seen by Tresa Endo  RRT/RCP  from [facility]  ?

## 2021-11-21 ENCOUNTER — Ambulatory Visit: Payer: Medicaid Other | Admitting: Nurse Practitioner

## 2021-11-21 ENCOUNTER — Encounter: Payer: Self-pay | Admitting: Nurse Practitioner

## 2021-11-21 VITALS — BP 120/88 | HR 80 | Temp 98.4°F | Resp 16 | Ht 60.0 in | Wt 238.0 lb

## 2021-11-21 DIAGNOSIS — G4733 Obstructive sleep apnea (adult) (pediatric): Secondary | ICD-10-CM | POA: Diagnosis not present

## 2021-11-21 DIAGNOSIS — J4520 Mild intermittent asthma, uncomplicated: Secondary | ICD-10-CM | POA: Diagnosis not present

## 2021-11-21 DIAGNOSIS — Z7189 Other specified counseling: Secondary | ICD-10-CM | POA: Diagnosis not present

## 2021-11-21 DIAGNOSIS — J358 Other chronic diseases of tonsils and adenoids: Secondary | ICD-10-CM

## 2021-11-21 DIAGNOSIS — Z87891 Personal history of nicotine dependence: Secondary | ICD-10-CM

## 2021-11-21 DIAGNOSIS — R0602 Shortness of breath: Secondary | ICD-10-CM | POA: Diagnosis not present

## 2021-11-21 DIAGNOSIS — Z9989 Dependence on other enabling machines and devices: Secondary | ICD-10-CM

## 2021-11-21 NOTE — Progress Notes (Signed)
[facility] [place], [place] [address]  Internal MEDICINE  Office Visit Note  Patient Name: Natasha Moreno  [id number]  [id number]  Date of Service: 11/21/2021  Chief Complaint  Patient presents with   Follow-up   Sleep Apnea    HPI Natasha Moreno presents for follow-up visit for CPAP compliance, sleep apnea and intermittent use of an inhaler.  She received her CPAP in November and is due for her first initial CPAP compliance visit.  Patient met with Claiborne Billings from [facility] yesterday.   CPAP download: 95 percentile pressure 7 95th percentile leak 7.1 apnea index 0.1 /hr apnea-hypopnea index  0.1 /hr total days used  >4 hr 58 days total days used <4 hr 5 days Total compliance 92 percent   She has not been able to wear in last 2 weeks due to tonsil surgery, she goes back 11-21-21 for follow up from surgery will get back on cpap as soon as dr allows. No other question or problems at this time.  Pt was seen by Claiborne Billings  RRT/RCP  from [facility]   Patient was previously provided with an albuterol inhaler when she had a upper respiratory infection and patient started using inhaler on an as-needed basis for when she was short of breath and wheezing.  Patient has never been assessed for asthma or COPD.  Spirometry performed in office today.  She had a slightly decreased FVC 78% and her FEV1 was 64%.  Results are indicative of mild obstructive airway disease and possible mild intermittent asthma.    Current Medication: Outpatient Encounter Medications as of 11/21/2021  Medication Sig   albuterol (PROAIR HFA) 108 (90 Base) MCG/ACT inhaler TAKE 2 PUFFS BY MOUTH EVERY 6 HOURS AS NEEDED FOR WHEEZE OR SHORTNESS OF BREATH   cetirizine (ZYRTEC) 10 MG tablet Take 10 mg by mouth daily as needed for allergies.    hydrochlorothiazide (HYDRODIURIL) 12.5 MG tablet TAKE 1 TABLET BY MOUTH EVERY DAY   HYDROcodone-acetaminophen (HYCET) 7.5-325 mg/15 ml solution 10-15 cc PO every 4-6 hours as needed for  pain   prednisoLONE (ORAPRED) 15 MG/5ML solution 10 cc PO BID x 3 days, then 5 cc PO BID x 3 days, then 5 cc PO QD x 3 days   Semaglutide, 1 MG/DOSE, 4 MG/3ML SOPN Inject 1 mg into the skin once a week.   Vitamin D, Ergocalciferol, (DRISDOL) 1.25 MG (50000 UNIT) CAPS capsule Take 1 capsule (50,000 Units total) by mouth every 7 (seven) days.   No facility-administered encounter medications on file as of 11/21/2021.    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[place])    Hyperlipidemia    Hypertension    Migraine    Morbid obesity ([place])    Sleep apnea    uses CPAP   Stroke [facility])     Family History: Family History  Problem Relation Age of Onset   Kidney failure Father    Heart attack Father  open heart surgery   Hypertension Father    Diabetes Father    Gout Father    Cataracts Father     Social History   Socioeconomic History   Marital status: Single    Spouse name: Not on file   Number of children: 2   Years of education: Not on file   Highest education level: High school graduate  Occupational History   Not on file  Tobacco Use   Smoking status: Former    Packs/day: 0.25    Types: Cigarettes    Quit date: 11/09/2017    Years since quitting: 4.0   Smokeless tobacco: Never   Tobacco comments:    approximately 4 cigarettes a day  Vaping Use   Vaping Use: Former  Substance and Sexual Activity   Alcohol use: Yes     Comment: every other weekend, not much    Drug use: No   Sexual activity: Yes    Birth control/protection: Condom  Other Topics Concern   Not on file  Social History Narrative   Lives at home with her parents, brother and her children   Right handed   Drinks rare caffeine    Social Determinants of Health   Financial Resource Strain: Not on file  Food Insecurity: Not on file  Transportation Needs: Not on file  Physical Activity: Not on file  Stress: Not on file  Social Connections: Not on file  Intimate Partner Violence: Not on file      Review of Systems  Constitutional:  Negative for chills, fatigue and unexpected weight change.  HENT:  Negative for congestion, postnasal drip, rhinorrhea, sneezing and sore throat.   Eyes:  Negative for redness.  Respiratory:  Positive for shortness of breath (intermittent) and wheezing (intermittent). Negative for cough and chest tightness.   Cardiovascular:  Negative for chest pain and palpitations.  Gastrointestinal:  Negative for abdominal pain, constipation, diarrhea, nausea and vomiting.  Genitourinary:  Negative for dysuria and frequency.  Musculoskeletal:  Negative for arthralgias, back pain, joint swelling and neck pain.  Skin:  Negative for rash.  Neurological: Negative.  Negative for tremors and numbness.  Hematological:  Negative for adenopathy. Does not bruise/bleed easily.  Psychiatric/Behavioral:  Negative for behavioral problems (Depression), sleep disturbance and suicidal ideas. The patient is not nervous/anxious.    Vital Signs: BP 120/88    Pulse 80    Temp 98.4 F (36.9 C)    Resp 16    Ht 5' (1.524 m)    Wt 238 lb (108 kg)    LMP 11/06/2021 (Exact Date) Comment: Urine pregnancy test 11/06/2021 negative   SpO2 98%    BMI 46.48 kg/m    Physical Exam Vitals reviewed.  Constitutional:      General: She is not in acute distress.    Appearance: Normal appearance. She is obese. She is not ill-appearing.  HENT:     Head:  Normocephalic and atraumatic.  Eyes:     Pupils: Pupils are equal, round, and reactive to light.  Cardiovascular:     Rate and Rhythm: Normal rate and regular rhythm.     Heart sounds: Normal heart sounds. No murmur heard. Pulmonary:     Effort: Pulmonary effort is normal. No respiratory distress.     Breath sounds: Normal breath sounds.  Neurological:     Mental Status: She is alert and oriented to person, place, and time.  Psychiatric:        Mood and Affect: Mood normal.  Behavior: Behavior normal.       Assessment/Plan: 1. Mild intermittent asthma without complication Spirometry indicative of mild intermittent asthma.  Patient use of albuterol rescue inhaler is intermittent and infrequent at this time.  She does not require the use of a maintenance inhaler yet.  Recommend at least annual spirometry to continue to monitor respiratory status.  2. OSA on CPAP Patient started using her CPAP in November 2022 on a setting of 7.  Her AHI is 0.1/hr. her leak is minimal and her compliance is 92%.  She had a tonsillectomy approximately 2 weeks ago and is still healing and it is difficult to wear the CPAP mask at this time.  She will follow-up with the surgeon soon and will resume using her CPAP as soon as she has been cleared to do so.  3. SOB (shortness of breath) on exertion Spirometry done in office, see problem #1. - Spirometry with Graph  4. CPAP use counseling Patient does not need any additional supplies at this time and she has no questions about the maintenance and cleaning of her CPAP machine  5. Former smoker She is a former smoker.  She quit smoking in 2019.  6. Tonsillolith Patient had a tonsillectomy 2 weeks ago and is healing well.   General Counseling: joye wesenberg understanding of the findings of todays visit and agrees with plan of treatment. I have discussed any further diagnostic evaluation that may be needed or ordered today. We also reviewed her  medications today. she has been encouraged to call the office with any questions or concerns that should arise related to todays visit.    Orders Placed This Encounter  Procedures   Spirometry with Graph    No orders of the defined types were placed in this encounter.   Return in about 4 months (around 03/23/2022) for F/U, Kinze Labo PCP --please reschedule DSK 8/24 visit for september after her visit w/kelly.   Total time spent:30 Minutes Time spent includes review of chart, medications, test results, and follow up plan with the patient.   [place] Controlled Substance Database was reviewed by me.  This patient was seen by Jonetta Osgood, FNP-C in collaboration with Dr. Clayborn Bigness as a part of collaborative care agreement.   Numan Zylstra R. Valetta Fuller, MSN, FNP-C Internal medicine

## 2021-12-03 ENCOUNTER — Telehealth: Payer: Self-pay

## 2021-12-03 NOTE — Telephone Encounter (Signed)
Patient scheduled for PSG on Tuesday, May 16,2023 at [facility]. ?

## 2021-12-06 ENCOUNTER — Telehealth: Payer: Self-pay

## 2021-12-06 NOTE — Telephone Encounter (Signed)
Verbal order for osa supplies signed by provider and placed in [facility] folder. ?

## 2021-12-14 ENCOUNTER — Other Ambulatory Visit: Payer: Self-pay | Admitting: Nurse Practitioner

## 2021-12-14 DIAGNOSIS — E559 Vitamin D deficiency, unspecified: Secondary | ICD-10-CM

## 2022-02-04 ENCOUNTER — Encounter (INDEPENDENT_AMBULATORY_CARE_PROVIDER_SITE_OTHER): Payer: BC Managed Care – PPO | Admitting: Internal Medicine

## 2022-02-04 DIAGNOSIS — G4719 Other hypersomnia: Secondary | ICD-10-CM | POA: Diagnosis not present

## 2022-02-13 NOTE — Procedures (Signed)
[place] Report Part I                                                                 Phone: [phone] Fax: [phone]  Patient Name: Natasha, Moreno. Acquisition Number: [id number]  Date of Birth: Oct 22, 1980 Acquisition Date: 02/04/2022  Referring Physician: Drema Dallas PA-C     History: The patient is a 41 year old female who was referred for re-evaluation of obstructive sleep apnea post-tonsillectomy. Medical History: hyperlipidemia, hypertension, migraines, obesity, OSA, Previous stroke.  Medications: aspirin, meloxicam, bisoprolol-hydrochlorothiazide, cetirizine, albuterol, semaglutide.  Procedure: This routine overnight polysomnogram was performed on the [facility] 5 using the standard diagnostic protocol. This included 6 channels of EEG, 2 channels of EOG, chin EMG, bilateral anterior tibialis EMG, nasal/oral thermistor, PTAF (nasal pressure transducer), chest and abdominal wall movements, EKG, and pulse oximetry.  Description: The total recording time was 434.5 minutes. The total sleep time was 355.0 minutes. There were a total of 35.0 minutes of wakefulness after sleep onset for a reducedsleep efficiency of 81.7%. The latency to sleep onset was prolonged at 44.5 minutes. The R sleep onset latency was within normal limits at 112.0 minutes. Sleep parameters, as a percentage of the total sleep time, demonstrated 1.4% of sleep was in N1 sleep, 73.4% N2, 12.7% N3 and 12.5% R sleep. There were a total of 52 arousals for an arousal index of 8.8 arousals per hour of sleep that was normal.  Respiratory monitoring demonstrated no significant snoring. Supine sleep was not observed. Only 2 hypopneas were observed the entire study. The baseline oxygen saturation during wakefulness was 97%, during NREM sleep averaged 96%, and during REM sleep averaged  97%. The total duration of oxygen < 90% was 0.5 minutes.  Cardiac monitoring- There were no significant  cardiac rhythm irregularities.   Periodic limb movement monitoring- demonstrated that there were 56 periodic limb movements for a periodic limb movement index of 9.5 periodic limb movements per hour of sleep.   Impression: This routine overnight polysomnogram did not demonstrate significant obstructive sleep apnea with only 2 hypopneas observed. Supine sleep was not observed due to back pain. It is not known if the patient is still using CPAP consistently at home which would affect the findings.  There were few periodic limb movements that commonly are not significant. Clinical correlation would be suggested.   There was a reduced sleep efficiency withincreased awakeningsand reduced percentages of REM and slow wave sleep.  Recommendations:     Would recommend weight loss in a patient with a BMI of 45.7    Allyne Gee, MD, [facility] Diplomate ABMS-Pulmonary, Critical Care and Sleep Medicine  Electronically reviewed and digitally signed  [place] Report Part II  Phone: [phone] Fax: [phone]  Patient last name Berent Neck Size 14.5   in. Acquisition [id number]  Patient first name Natasha Moreno. Weight 234.0 lbs. Started 02/04/2022 at 9:48:01 PM  Birth date Dec 22, 1980 Height 60.0 in. Stopped 02/05/2022 at 5:15:19 AM  Age 66 BMI 45.7 lb/in2 Duration 434.5  Study Type Adult      Margaretmary Eddy Sleep Tech, [place] by: Richelle Ito. Saunders Glance, PhD, ABSM, FAASM Sleep Data: Lights Out: 9:54:01 PM Sleep Onset: 10:38:31  PM  Lights On: 5:08:31 AM Sleep Efficiency: 81.7 %  Total Recording Time: 434.5 min Sleep Latency (from Lights Off) 44.5 min  Total Sleep Time (TST): 355.0 min R Latency (from Sleep Onset): 112.0 min  Sleep Period Time: 389.5 min Total number of awakenings: 18  Wake during sleep: 34.5 min Wake After Sleep Onset (WASO): 35.0 min   Sleep Data:         Arousal Summary: Stage  Latency from lights out (min) Latency from sleep onset (min)  Duration (min) % Total Sleep Time  Normal values  N 1 55.5 11.0 5.0 1.4 (5%)  N 2 44.5 0.0 260.5 73.4 (50%)  N 3 105.5 61.0 45.0 12.7 (20%)  R 156.5 112.0 44.5 12.5 (25%)    Number Index  Spontaneous 47 7.9  Apneas & Hypopneas 0 0.0  RERAs 0 0.0       (Apneas & Hypopneas & RERAs)  (0) (0.0)  Limb Movement 5 0.8  Snore 0 0.0  TOTAL 52 8.8     Respiratory Data:  CA OA MA Apnea Hypopnea* A+ H RERA Total  Number 0 0 0 0 2 2 0 2  Mean Dur (sec) 0.0 0.0 0.0 0.0 15.5 15.5 0.0 15.5  Max Dur (sec) 0.0 0.0 0.0 0.0 20.0 20.0 0.0 20.0  Total Dur (min) 0.0 0.0 0.0 0.0 0.5 0.5 0.0 0.5  % of TST 0.0 0.0 0.0 0.0 0.1 0.1 0.0 0.1  Index (#/h TST) 0.0 0.0 0.0 0.0 0.3 0.3 0.0 0.3  *Hypopneas scored based on 4% or greater desaturation.  Sleep Stage:        REM NREM TST  AHI 1.3 0.2 0.3  RDI 1.3 0.2 0.3           Body Position Data:  Sleep (min) TST (%) REM (min) NREM (min) CA (#) OA (#) MA (#) HYP (#) AHI (#/h) RERA (#) RDI (#/h) Desat (#)  Supine    0.00                      0 0.00     Non-Supine 355.00 100.00 44.50 310.50 0.00 0.00 0.00 2.00 0.34 0 0.34 20.00  Right: 355.0 100.00 44.5 310.5 0 0 0 2 0.3 0 0.3 20     Snoring: Total number of snoring episodes  0  Total time with snoring    min (   % of sleep)   Oximetry Distribution:             WK REM NREM TOTAL  Average (%)   97 97 96 96  < 90% 0.5 0.0 0.0 0.5  < 80% 0.2 0.0 0.0 0.2  < 70% 0.0 0.0 0.0 0.0  # of Desaturations* 3 7 10 20   Desat Index (#/hour) 2.3 9.4 1.9 3.4  Desat Max (%) 8 5 6 8   Desat Max Dur (sec) 119.0 31.0 32.0 119.0  Approx Min O2 during sleep 90  Approx min O2 during a respiratory event 93  Was Oxygen added (Y/N) and final rate No:   0 LPM  *Desaturations based on 3% or greater drop from baseline.   Cheyne Stokes Breathing: None Present   Heart Rate Summary:  Average Heart Rate During Sleep 77.8 bpm      Highest Heart Rate During Sleep (95th %) 84.0 bpm      Highest Heart Rate  During Sleep 96 bpm      Highest Heart Rate During Recording (TIB) 171 bpm (artifact)  Heart Rate Observations: Event Type # Events   Bradycardia 0 Lowest HR Scored: N/A  Sinus Tachycardia During Sleep 0 Highest HR Scored: N/A  Narrow Complex Tachycardia 0 Highest HR Scored: N/A  Wide Complex Tachycardia 0 Highest HR Scored: N/A  Asystole 0 Longest Pause: N/A  Atrial Fibrillation 0 Duration Longest Event: N/A  Other Arrythmias  No Type:    Periodic Limb Movement Data: (Primary legs unless otherwise noted) Total # Limb Movement 66 Limb Movement Index 11.2  Total # PLMS 56 PLMS Index 9.5  Total # PLMS Arousals 4 PLMS Arousal Index 0.7  Percentage Sleep Time with PLMS 37.3min (10.5 % sleep)  Mean Duration limb movements (secs) 371.9

## 2022-02-18 DIAGNOSIS — G4733 Obstructive sleep apnea (adult) (pediatric): Secondary | ICD-10-CM | POA: Diagnosis not present

## 2022-02-24 DIAGNOSIS — G4733 Obstructive sleep apnea (adult) (pediatric): Secondary | ICD-10-CM | POA: Diagnosis not present

## 2022-03-21 DIAGNOSIS — G4733 Obstructive sleep apnea (adult) (pediatric): Secondary | ICD-10-CM | POA: Diagnosis not present

## 2022-03-25 ENCOUNTER — Other Ambulatory Visit: Payer: Self-pay | Admitting: Nurse Practitioner

## 2022-03-27 ENCOUNTER — Ambulatory Visit: Payer: BC Managed Care – PPO | Admitting: Nurse Practitioner

## 2022-03-27 ENCOUNTER — Encounter: Payer: Self-pay | Admitting: Nurse Practitioner

## 2022-03-27 VITALS — BP 117/86 | HR 80 | Temp 98.4°F | Resp 16 | Ht 60.0 in | Wt 230.2 lb

## 2022-03-27 DIAGNOSIS — K760 Fatty (change of) liver, not elsewhere classified: Secondary | ICD-10-CM | POA: Diagnosis not present

## 2022-03-27 DIAGNOSIS — Z6841 Body Mass Index (BMI) 40.0 and over, adult: Secondary | ICD-10-CM

## 2022-03-27 DIAGNOSIS — E559 Vitamin D deficiency, unspecified: Secondary | ICD-10-CM

## 2022-03-27 DIAGNOSIS — D751 Secondary polycythemia: Secondary | ICD-10-CM

## 2022-03-27 DIAGNOSIS — I10 Essential (primary) hypertension: Secondary | ICD-10-CM

## 2022-03-27 DIAGNOSIS — E538 Deficiency of other specified B group vitamins: Secondary | ICD-10-CM

## 2022-03-27 MED ORDER — HYDROCHLOROTHIAZIDE 12.5 MG PO TABS: 12.5000 mg | ORAL_TABLET | Freq: Every day | ORAL | 1 refills | Status: DC

## 2022-03-27 MED ORDER — VITAMIN D (ERGOCALCIFEROL) 1.25 MG (50000 UNIT) PO CAPS: 50000.0000 [IU] | ORAL_CAPSULE | ORAL | 3 refills | Status: DC

## 2022-03-27 MED ORDER — SEMAGLUTIDE-WEIGHT MANAGEMENT 1.7 MG/0.75ML ~~LOC~~ SOAJ: 1.7000 mg | SUBCUTANEOUS | 3 refills | Status: DC

## 2022-03-27 NOTE — Progress Notes (Signed)
[facility] [address] [place], [place] [address]  Internal MEDICINE  Office Visit Note  Patient Name: Natasha Moreno  [id number]  [id number]  Date of Service: 03/27/2022  Chief Complaint  Patient presents with   Follow-up   Hyperlipidemia   Hypertension   Anemia    HPI [name] presents for a follow-up visit for hypertension, hyperlipidemia, weight loss management and medication refills.  She has lost 8 pounds since her previous office visit but her insurance no longer covers Ozempic because she recently switched insurances.  She wants to know what else she can try that her insurance will cover. Her blood pressure is well controlled with current medication Also a few months ago she was diagnosed with fatty liver according to her ultrasound with hepatomegaly and her liver enzymes were slightly elevated.  Need to repeat her cholesterol levels and her hepatic function panel to determine if she can restart her statin therapy     Current Medication: Outpatient Encounter Medications as of 03/27/2022  Medication Sig   albuterol (PROAIR HFA) 108 (90 Base) MCG/ACT inhaler TAKE 2 PUFFS BY MOUTH EVERY 6 HOURS AS NEEDED FOR WHEEZE OR SHORTNESS OF BREATH   cetirizine (ZYRTEC) 10 MG tablet Take 10 mg by mouth daily as needed for allergies.    Semaglutide-Weight Management 1.7 MG/0.75ML SOAJ Inject 1.7 mg into the skin once a week.   [DISCONTINUED] hydrochlorothiazide (HYDRODIURIL) 12.5 MG tablet TAKE 1 TABLET BY MOUTH EVERY DAY   [DISCONTINUED] HYDROcodone-acetaminophen (HYCET) 7.5-325 mg/15 ml solution 10-15 cc PO every 4-6 hours as needed for pain   [DISCONTINUED] prednisoLONE (ORAPRED) 15 MG/5ML solution 10 cc PO BID x 3 days, then 5 cc PO BID x 3 days, then 5 cc PO QD x 3 days   [DISCONTINUED] Semaglutide, 1 MG/DOSE, 4 MG/3ML SOPN Inject 1 mg into the skin once a week.   [DISCONTINUED] Vitamin D, Ergocalciferol, (DRISDOL) 1.25 MG (50000 UNIT) CAPS capsule TAKE 1 CAPSULE  (50,000 UNITS TOTAL) BY MOUTH EVERY 7 (SEVEN) DAYS   hydrochlorothiazide (HYDRODIURIL) 12.5 MG tablet Take 1 tablet (12.5 mg total) by mouth daily.   Vitamin D, Ergocalciferol, (DRISDOL) 1.25 MG (50000 UNIT) CAPS capsule Take 1 capsule (50,000 Units total) by mouth every 7 (seven) days.   No facility-administered encounter medications on file as of 03/27/2022.    Surgical History: Past Surgical History:  Procedure Laterality Date   CHOLECYSTECTOMY     TEE WITHOUT CARDIOVERSION N/A 11/11/2017   Procedure: TRANSESOPHAGEAL ECHOCARDIOGRAM (TEE);  Surgeon: Dalia Heading, MD;  Location: [facility] ORS;  Service: Cardiovascular;  Laterality: N/A;   TONSILLECTOMY AND ADENOIDECTOMY Bilateral 11/06/2021   Procedure: TONSILLECTOMY;  Surgeon: Geanie Logan, MD;  Location: [facility] ORS;  Service: ENT;  Laterality: Bilateral;   TUBAL LIGATION     WISDOM TOOTH EXTRACTION      Medical History: Past Medical History:  Diagnosis Date   Anemia    Hemoglobin E-A disorder (HCC)    Hyperlipidemia    Hypertension    Migraine    Morbid obesity (HCC)    Sleep apnea    uses CPAP   Stroke [facility])     Family History: Family History  Problem Relation Age of Onset   Kidney failure Father    Heart attack Father        open heart surgery   Hypertension Father    Diabetes Father    Gout Father    Cataracts Father     Social History   Socioeconomic History  Marital status: Single    Spouse name: Not on file   Number of children: 2   Years of education: Not on file   Highest education level: High school graduate  Occupational History   Not on file  Tobacco Use   Smoking status: Former    Packs/day: 0.25    Types: Cigarettes    Quit date: 11/09/2017    Years since quitting: 4.3   Smokeless tobacco: Never   Tobacco comments:    approximately 4 cigarettes a day  Vaping Use   Vaping Use: Former  Substance and Sexual Activity   Alcohol use: Yes    Comment: every other weekend, not much    Drug use: No    Sexual activity: Yes    Birth control/protection: Condom  Other Topics Concern   Not on file  Social History Narrative   Lives at home with her parents, brother and her children   Right handed   Drinks rare caffeine    Social Determinants of Health   Financial Resource Strain: Low Risk  (11/09/2017)   Overall Financial Resource Strain (CARDIA)    Difficulty of Paying Living Expenses: Not hard at all  Food Insecurity: No Food Insecurity (11/09/2017)   Hunger Vital Sign    Worried About Running Out of Food in the Last Year: Never true    Ran Out of Food in the Last Year: Never true  Transportation Needs: No Transportation Needs (01/06/2018)   PRAPARE - Administrator, Civil Service (Medical): No    Lack of Transportation (Non-Medical): No  Recent Concern: Transportation Needs - Unmet Transportation Needs (11/09/2017)   PRAPARE - Transportation    Lack of Transportation (Medical): Yes    Lack of Transportation (Non-Medical): Yes  Physical Activity: Sufficiently Active (11/09/2017)   Exercise Vital Sign    Days of Exercise per Week: 3 days    Minutes of Exercise per Session: 50 min  Stress: No Stress Concern Present (11/09/2017)   [employer] of Occupational Health - Occupational Stress Questionnaire    Feeling of Stress : Not at all  Social Connections: Unknown (11/09/2017)   Social Connection and Isolation Panel [NHANES]    Frequency of Communication with Friends and Family: More than three times a week    Frequency of Social Gatherings with Friends and Family: Once a week    Attends Religious Services: Never    Database administrator or Organizations: No    Attends Engineer, structural: Never    Marital Status: Not on file  Intimate Partner Violence: Not on file      Review of Systems  Constitutional:  Negative for chills, fatigue and unexpected weight change.  HENT:  Negative for congestion, rhinorrhea, sneezing and sore throat.   Eyes:  Negative  for redness.  Respiratory:  Negative for cough, chest tightness and shortness of breath.   Cardiovascular:  Negative for chest pain and palpitations.  Gastrointestinal:  Negative for abdominal pain, constipation, diarrhea, nausea and vomiting.  Genitourinary:  Negative for dysuria and frequency.  Musculoskeletal:  Negative for arthralgias, back pain, joint swelling and neck pain.  Skin:  Negative for rash.  Neurological: Negative.  Negative for tremors and numbness.  Hematological:  Negative for adenopathy. Does not bruise/bleed easily.  Psychiatric/Behavioral:  Negative for behavioral problems (Depression), sleep disturbance and suicidal ideas. The patient is not nervous/anxious.     Vital Signs: BP 117/86   Pulse 80   Temp 98.4 F (36.9 C)  Resp 16   Ht 5' (1.524 m)   Wt 230 lb 3.2 oz (104.4 kg)   SpO2 99%   BMI 44.96 kg/m    Physical Exam Vitals reviewed.  Constitutional:      General: She is not in acute distress.    Appearance: Normal appearance. She is obese. She is not ill-appearing.  HENT:     Head: Normocephalic and atraumatic.  Eyes:     Pupils: Pupils are equal, round, and reactive to light.  Cardiovascular:     Rate and Rhythm: Normal rate and regular rhythm.  Pulmonary:     Effort: Pulmonary effort is normal. No respiratory distress.  Neurological:     Mental Status: She is alert and oriented to person, place, and time.  Psychiatric:        Mood and Affect: Mood normal.        Behavior: Behavior normal.        Assessment/Plan: 1. Essential hypertension Stable, BP controlled, has some lower extremity edema at times, may need to increase HCTZ if the edema persists or gets worse.  - hydrochlorothiazide (HYDRODIURIL) 12.5 MG tablet; Take 1 tablet (12.5 mg total) by mouth daily.  Dispense: 90 tablet; Refill: 1  2. Erythrocytosis Additional labs ordered.  - B12 and Folate Panel - CBC with Differential/Platelet -Iron panel - TIBU, ferritin, iron, iron  saturation  3. Fatty liver Reevaluate liver function, lab ordered.  - Hepatic function panel  4. Vitamin D deficiency Refill ordered and lab ordered.  - Vitamin D, Ergocalciferol, (DRISDOL) 1.25 MG (50000 UNIT) CAPS capsule; Take 1 capsule (50,000 Units total) by mouth every 7 (seven) days.  Dispense: 5 capsule; Refill: 3 - Vitamin D (25 hydroxy)  5. B12 deficiency Labs ordered.  - B12 and Folate Panel - CBC with Differential/Platelet  6. Class 3 severe obesity due to excess calories with serious comorbidity and body mass index (BMI) of 40.0 to 44.9 in adult [facility]) Ozempic no longer covered by insurance, wegovy ordered at higher dose. Follow up in 4 weeks.  - Semaglutide-Weight Management 1.7 MG/0.75ML SOAJ; Inject 1.7 mg into the skin once a week.  Dispense: 3 mL; Refill: 3   General Counseling: Brytni verbalizes understanding of the findings of todays visit and agrees with plan of treatment. I have discussed any further diagnostic evaluation that may be needed or ordered today. We also reviewed her medications today. she has been encouraged to call the office with any questions or concerns that should arise related to todays visit.    Orders Placed This Encounter  Procedures   B12 and Folate Panel   Hepatic function panel   Vitamin D (25 hydroxy)   CBC with Differential/Platelet    Meds ordered this encounter  Medications   Semaglutide-Weight Management 1.7 MG/0.75ML SOAJ    Sig: Inject 1.7 mg into the skin once a week.    Dispense:  3 mL    Refill:  3    Switching from ozempic to wegovy due to insurance change, will possibly need prior auth, please let us know.   hydrochlorothiazide (HYDRODIURIL) 12.5 MG tablet    Sig: Take 1 tablet (12.5 mg total) by mouth daily.    Dispense:  90 tablet    Refill:  1   Vitamin D, Ergocalciferol, (DRISDOL) 1.25 MG (50000 UNIT) CAPS capsule    Sig: Take 1 capsule (50,000 Units total) by mouth every 7 (seven) days.    Dispense:  5  capsule    Refill:  3    Return for F/U, Weight loss, Labs, Benton Tooker PCP.   Total time spent:30 Minutes Time spent includes review of chart, medications, test results, and follow up plan with the patient.   [place] Controlled Substance Database was reviewed by me.  This patient was seen by Sallyanne Kuster, FNP-C in collaboration with Dr. Beverely Risen as a part of collaborative care agreement.   Marquist Binstock R. Tedd Sias, MSN, FNP-C Internal medicine

## 2022-03-30 ENCOUNTER — Telehealth: Payer: Self-pay

## 2022-03-30 NOTE — Telephone Encounter (Signed)
PA for WEGOVY 1.7 mg/0.75 ml 03/30/22 @ 10:22 pm

## 2022-04-08 DIAGNOSIS — K760 Fatty (change of) liver, not elsewhere classified: Secondary | ICD-10-CM | POA: Diagnosis not present

## 2022-04-08 DIAGNOSIS — D751 Secondary polycythemia: Secondary | ICD-10-CM | POA: Diagnosis not present

## 2022-04-08 DIAGNOSIS — E538 Deficiency of other specified B group vitamins: Secondary | ICD-10-CM | POA: Diagnosis not present

## 2022-04-08 DIAGNOSIS — E559 Vitamin D deficiency, unspecified: Secondary | ICD-10-CM | POA: Diagnosis not present

## 2022-04-09 LAB — HEPATIC FUNCTION PANEL
ALT: 83 IU/L — ABNORMAL HIGH (ref 0–32)
AST: 50 IU/L — ABNORMAL HIGH (ref 0–40)
Albumin: 4.1 g/dL (ref 3.9–4.9)
Alkaline Phosphatase: 67 IU/L (ref 44–121)
Bilirubin Total: 0.7 mg/dL (ref 0.0–1.2)
Bilirubin, Direct: 0.22 mg/dL (ref 0.00–0.40)
Total Protein: 6.7 g/dL (ref 6.0–8.5)

## 2022-04-09 LAB — CBC WITH DIFFERENTIAL/PLATELET
Basophils Absolute: 0.1 10*3/uL (ref 0.0–0.2)
Basos: 1 %
EOS (ABSOLUTE): 0.3 10*3/uL (ref 0.0–0.4)
Eos: 4 %
Hematocrit: 34 % (ref 34.0–46.6)
Hemoglobin: 10.5 g/dL — ABNORMAL LOW (ref 11.1–15.9)
Immature Grans (Abs): 0.1 10*3/uL (ref 0.0–0.1)
Immature Granulocytes: 1 %
Lymphocytes Absolute: 2.1 10*3/uL (ref 0.7–3.1)
Lymphs: 32 %
MCH: 16.3 pg — ABNORMAL LOW (ref 26.6–33.0)
MCHC: 30.9 g/dL — ABNORMAL LOW (ref 31.5–35.7)
MCV: 53 fL — ABNORMAL LOW (ref 79–97)
Monocytes Absolute: 0.4 10*3/uL (ref 0.1–0.9)
Monocytes: 6 %
Neutrophils Absolute: 3.5 10*3/uL (ref 1.4–7.0)
Neutrophils: 56 %
Platelets: 371 10*3/uL (ref 150–450)
RBC: 6.44 x10E6/uL — ABNORMAL HIGH (ref 3.77–5.28)
RDW: 23 % — ABNORMAL HIGH (ref 11.7–15.4)
WBC: 6.4 10*3/uL (ref 3.4–10.8)

## 2022-04-09 LAB — IRON,TIBC AND FERRITIN PANEL
Ferritin: 191 ng/mL — ABNORMAL HIGH (ref 15–150)
Iron Saturation: 24 % (ref 15–55)
Iron: 84 ug/dL (ref 27–159)
Total Iron Binding Capacity: 353 ug/dL (ref 250–450)
UIBC: 269 ug/dL (ref 131–425)

## 2022-04-09 LAB — VITAMIN D 25 HYDROXY (VIT D DEFICIENCY, FRACTURES): Vit D, 25-Hydroxy: 28.9 ng/mL — ABNORMAL LOW (ref 30.0–100.0)

## 2022-04-09 LAB — B12 AND FOLATE PANEL
Folate: 7.5 ng/mL (ref >3.0)
Vitamin B-12: 479 pg/mL (ref 232–1245)

## 2022-04-10 NOTE — Progress Notes (Signed)
We will discuss the lab results at the upcoming office visit in early august. She continues to have elevated liver enzymes and hepatomegaly on ultrasound. She also has persistent erythrocytosis with other abnormal values on CBC. Will need referrals and will discuss  in detail at office visit.

## 2022-04-12 ENCOUNTER — Telehealth: Payer: Self-pay

## 2022-04-12 DIAGNOSIS — E66813 Obesity, class 3: Secondary | ICD-10-CM

## 2022-04-12 MED ORDER — SEMAGLUTIDE-WEIGHT MANAGEMENT 1.7 MG/0.75ML ~~LOC~~ SOAJ: 1.7000 mg | SUBCUTANEOUS | 3 refills | Status: DC

## 2022-04-12 NOTE — Telephone Encounter (Signed)
03/30/22 PA for [facility] was Approved valid from 03/30/22 to 08/02/22

## 2022-04-20 DIAGNOSIS — G4733 Obstructive sleep apnea (adult) (pediatric): Secondary | ICD-10-CM | POA: Diagnosis not present

## 2022-04-29 ENCOUNTER — Ambulatory Visit: Payer: BC Managed Care – PPO | Admitting: Nurse Practitioner

## 2022-05-06 ENCOUNTER — Ambulatory Visit: Payer: BC Managed Care – PPO | Admitting: Nurse Practitioner

## 2022-05-06 ENCOUNTER — Encounter: Payer: Self-pay | Admitting: Nurse Practitioner

## 2022-05-06 VITALS — BP 119/80 | HR 77 | Temp 98.4°F | Resp 16 | Ht 60.0 in | Wt 230.0 lb

## 2022-05-06 DIAGNOSIS — R7989 Other specified abnormal findings of blood chemistry: Secondary | ICD-10-CM | POA: Diagnosis not present

## 2022-05-06 DIAGNOSIS — K219 Gastro-esophageal reflux disease without esophagitis: Secondary | ICD-10-CM

## 2022-05-06 DIAGNOSIS — E559 Vitamin D deficiency, unspecified: Secondary | ICD-10-CM

## 2022-05-06 DIAGNOSIS — D751 Secondary polycythemia: Secondary | ICD-10-CM | POA: Diagnosis not present

## 2022-05-06 MED ORDER — FAMOTIDINE 20 MG PO TABS
20.0000 mg | ORAL_TABLET | Freq: Two times a day (BID) | ORAL | 1 refills | Status: DC
Start: 2022-05-06 — End: 2024-08-09

## 2022-05-06 MED ORDER — VITAMIN D (ERGOCALCIFEROL) 1.25 MG (50000 UNIT) PO CAPS: 50000.0000 [IU] | ORAL_CAPSULE | ORAL | 1 refills | Status: DC

## 2022-05-06 NOTE — Patient Instructions (Signed)
Start a B12 supplement 1000 mcg daily. (Over the counter).   Restart weekly prescription vitamin D capsule

## 2022-05-06 NOTE — Progress Notes (Cosign Needed Addendum)
[facility] [address] [place], [place] [address]  Internal MEDICINE  Office Visit Note  Patient Name: Natasha Moreno  [id number]  [id number]  Date of Service: 05/06/2022  Chief Complaint  Patient presents with   Follow-up   Hypertension   Hyperlipidemia   Gastroesophageal Reflux    Recent acid reflux, not consistent    HPI Natasha Moreno presents for follow-up for hypertension, hyperlipidemia GERD and weight loss management --Blood pressure stable with current medication --Acid reflux worsened by Reginal Lutes, famotidine helping but wears off before bedtime --No weight loss since previous office visit, just finished 4 weeks of 1.7 mg dose will stay at this strength --Vitamin D level improved some but still low -- Repeat labs were done to review your liver function as well as CBC --- RBC remains chronically elevated, as well as low hemoglobin, low MCV, low MCH and elevated RDW, iron panel was normal except for elevated ferritin -- Patient also concerned about needing to restart cholesterol medication due to risk of stroke but has history of elevated liver enzymes and enlarged liver  --been trying to get repatha approved, since statins, fenofibrate and gemfibrozil are contraindicated with liver disease    Current Medication: Outpatient Encounter Medications as of 05/06/2022  Medication Sig   albuterol (PROAIR HFA) 108 (90 Base) MCG/ACT inhaler TAKE 2 PUFFS BY MOUTH EVERY 6 HOURS AS NEEDED FOR WHEEZE OR SHORTNESS OF BREATH   cetirizine (ZYRTEC) 10 MG tablet Take 10 mg by mouth daily as needed for allergies.    famotidine (PEPCID) 20 MG tablet Take 1 tablet (20 mg total) by mouth 2 (two) times daily.   hydrochlorothiazide (HYDRODIURIL) 12.5 MG tablet Take 1 tablet (12.5 mg total) by mouth daily.   Semaglutide-Weight Management 1.7 MG/0.75ML SOAJ Inject 1.7 mg into the skin once a week.   Vitamin D, Ergocalciferol, (DRISDOL) 1.25 MG (50000 UNIT) CAPS capsule Take 1 capsule  (50,000 Units total) by mouth every 7 (seven) days.   [DISCONTINUED] Vitamin D, Ergocalciferol, (DRISDOL) 1.25 MG (50000 UNIT) CAPS capsule Take 1 capsule (50,000 Units total) by mouth every 7 (seven) days.   No facility-administered encounter medications on file as of 05/06/2022.    Surgical History: Past Surgical History:  Procedure Laterality Date   CHOLECYSTECTOMY     TEE WITHOUT CARDIOVERSION N/A 11/11/2017   Procedure: TRANSESOPHAGEAL ECHOCARDIOGRAM (TEE);  Surgeon: Dalia Heading, MD;  Location: [facility] ORS;  Service: Cardiovascular;  Laterality: N/A;   TONSILLECTOMY AND ADENOIDECTOMY Bilateral 11/06/2021   Procedure: TONSILLECTOMY;  Surgeon: Geanie Logan, MD;  Location: [facility] ORS;  Service: ENT;  Laterality: Bilateral;   TUBAL LIGATION     WISDOM TOOTH EXTRACTION      Medical History: Past Medical History:  Diagnosis Date   Anemia    Hemoglobin E-A disorder (HCC)    Hyperlipidemia    Hypertension    Migraine    Morbid obesity (HCC)    Sleep apnea    uses CPAP   Stroke [facility])     Family History: Family History  Problem Relation Age of Onset   Kidney failure Father    Heart attack Father        open heart surgery   Hypertension Father    Diabetes Father    Gout Father    Cataracts Father     Social History   Socioeconomic History   Marital status: Single    Spouse name: Not on file   Number of children: 2   Years of education: Not  on file   Highest education level: High school graduate  Occupational History   Not on file  Tobacco Use   Smoking status: Former    Packs/day: 0.25    Types: Cigarettes    Quit date: 11/09/2017    Years since quitting: 4.4   Smokeless tobacco: Never   Tobacco comments:    approximately 4 cigarettes a day  Vaping Use   Vaping Use: Former  Substance and Sexual Activity   Alcohol use: Yes    Comment: every other weekend, not much    Drug use: No   Sexual activity: Yes    Birth control/protection: Condom  Other Topics  Concern   Not on file  Social History Narrative   Lives at home with her parents, brother and her children   Right handed   Drinks rare caffeine    Social Determinants of Health   Financial Resource Strain: Low Risk  (11/09/2017)   Overall Financial Resource Strain (CARDIA)    Difficulty of Paying Living Expenses: Not hard at all  Food Insecurity: No Food Insecurity (11/09/2017)   Hunger Vital Sign    Worried About Running Out of Food in the Last Year: Never true    Ran Out of Food in the Last Year: Never true  Transportation Needs: No Transportation Needs (01/06/2018)   PRAPARE - Administrator, Civil Service (Medical): No    Lack of Transportation (Non-Medical): No  Recent Concern: Transportation Needs - Unmet Transportation Needs (11/09/2017)   PRAPARE - Transportation    Lack of Transportation (Medical): Yes    Lack of Transportation (Non-Medical): Yes  Physical Activity: Sufficiently Active (11/09/2017)   Exercise Vital Sign    Days of Exercise per Week: 3 days    Minutes of Exercise per Session: 50 min  Stress: No Stress Concern Present (11/09/2017)   [employer] of Occupational Health - Occupational Stress Questionnaire    Feeling of Stress : Not at all  Social Connections: Unknown (11/09/2017)   Social Connection and Isolation Panel [NHANES]    Frequency of Communication with Friends and Family: More than three times a week    Frequency of Social Gatherings with Friends and Family: Once a week    Attends Religious Services: Never    Database administrator or Organizations: No    Attends Engineer, structural: Never    Marital Status: Not on file  Intimate Partner Violence: Not on file      Review of Systems  Constitutional:  Negative for chills, fatigue and unexpected weight change.  HENT:  Negative for congestion, rhinorrhea, sneezing and sore throat.   Eyes:  Negative for redness.  Respiratory:  Negative for cough, chest tightness and  shortness of breath.   Cardiovascular:  Negative for chest pain and palpitations.  Gastrointestinal:  Negative for abdominal pain, constipation, diarrhea, nausea and vomiting.  Genitourinary:  Negative for dysuria and frequency.  Musculoskeletal:  Negative for arthralgias, back pain, joint swelling and neck pain.  Skin:  Negative for rash.  Neurological: Negative.  Negative for tremors and numbness.  Hematological:  Negative for adenopathy. Does not bruise/bleed easily.  Psychiatric/Behavioral:  Negative for behavioral problems (Depression), sleep disturbance and suicidal ideas. The patient is not nervous/anxious.     Vital Signs: BP 119/80   Pulse 77   Temp 98.4 F (36.9 C)   Resp 16   Ht 5' (1.524 m)   Wt 230 lb (104.3 kg)   SpO2 98%  BMI 44.92 kg/m    Physical Exam Vitals reviewed.  Constitutional:      General: She is not in acute distress.    Appearance: Normal appearance. She is obese. She is not ill-appearing.  HENT:     Head: Normocephalic and atraumatic.  Eyes:     Pupils: Pupils are equal, round, and reactive to light.  Cardiovascular:     Rate and Rhythm: Normal rate and regular rhythm.  Pulmonary:     Effort: Pulmonary effort is normal. No respiratory distress.  Neurological:     Mental Status: She is alert and oriented to person, place, and time.  Psychiatric:        Mood and Affect: Mood normal.        Behavior: Behavior normal.        Assessment/Plan: 1. Erythrocytosis Referred to hematology for further evaluation - Ambulatory referral to Hematology / Oncology  2. Elevated ferritin level Referred to hematology for further evaluation - Ambulatory referral to Hematology / Oncology  3. Gastroesophageal reflux disease without esophagitis Increase frequency of famotidine to twice daily - famotidine (PEPCID) 20 MG tablet; Take 1 tablet (20 mg total) by mouth 2 (two) times daily.  Dispense: 180 tablet; Refill: 1  4. Vitamin D deficiency Continue  weekly vitamin D supplement - Vitamin D, Ergocalciferol, (DRISDOL) 1.25 MG (50000 UNIT) CAPS capsule; Take 1 capsule (50,000 Units total) by mouth every 7 (seven) days.  Dispense: 12 capsule; Refill: 1   General Counseling: Dulcemaria verbalizes understanding of the findings of todays visit and agrees with plan of treatment. I have discussed any further diagnostic evaluation that may be needed or ordered today. We also reviewed her medications today. she has been encouraged to call the office with any questions or concerns that should arise related to todays visit.    Orders Placed This Encounter  Procedures   Ambulatory referral to Hematology / Oncology    Meds ordered this encounter  Medications   famotidine (PEPCID) 20 MG tablet    Sig: Take 1 tablet (20 mg total) by mouth 2 (two) times daily.    Dispense:  180 tablet    Refill:  1   Vitamin D, Ergocalciferol, (DRISDOL) 1.25 MG (50000 UNIT) CAPS capsule    Sig: Take 1 capsule (50,000 Units total) by mouth every 7 (seven) days.    Dispense:  12 capsule    Refill:  1    Return in about 1 month (around 06/06/2022) for F/U, Weight loss, eval new med, Sollie Vultaggio PCP.   Total time spent:30 Minutes Time spent includes review of chart, medications, test results, and follow up plan with the patient.   [place] Controlled Substance Database was reviewed by me.  This patient was seen by Sallyanne Kuster, FNP-C in collaboration with Dr. Beverely Risen as a part of collaborative care agreement.   Pinchus Weckwerth R. Tedd Sias, MSN, FNP-C Internal medicine

## 2022-05-13 ENCOUNTER — Encounter: Payer: Self-pay | Admitting: Oncology

## 2022-05-13 ENCOUNTER — Inpatient Hospital Stay: Payer: BC Managed Care – PPO | Attending: Oncology | Admitting: Oncology

## 2022-05-13 ENCOUNTER — Inpatient Hospital Stay: Payer: BC Managed Care – PPO

## 2022-05-13 VITALS — BP 115/87 | HR 81 | Temp 97.7°F | Resp 18 | Wt 229.0 lb

## 2022-05-13 DIAGNOSIS — I10 Essential (primary) hypertension: Secondary | ICD-10-CM | POA: Diagnosis not present

## 2022-05-13 DIAGNOSIS — D649 Anemia, unspecified: Secondary | ICD-10-CM | POA: Diagnosis not present

## 2022-05-13 DIAGNOSIS — Z79899 Other long term (current) drug therapy: Secondary | ICD-10-CM | POA: Insufficient documentation

## 2022-05-13 DIAGNOSIS — D509 Iron deficiency anemia, unspecified: Secondary | ICD-10-CM

## 2022-05-13 LAB — CBC WITH DIFFERENTIAL/PLATELET
Abs Immature Granulocytes: 0 10*3/uL (ref 0.00–0.07)
Band Neutrophils: 0 %
Basophils Absolute: 0.1 10*3/uL (ref 0.0–0.1)
Basophils Relative: 1 %
Blasts: 0 %
Eosinophils Absolute: 0.2 10*3/uL (ref 0.0–0.5)
Eosinophils Relative: 3 %
HCT: 34.9 % — ABNORMAL LOW (ref 36.0–46.0)
Hemoglobin: 10.7 g/dL — ABNORMAL LOW (ref 12.0–15.0)
Lymphocytes Relative: 32 %
Lymphs Abs: 2.4 10*3/uL (ref 0.7–4.0)
MCH: 16.4 pg — ABNORMAL LOW (ref 26.0–34.0)
MCHC: 30.7 g/dL (ref 30.0–36.0)
MCV: 53.5 fL — ABNORMAL LOW (ref 80.0–100.0)
Metamyelocytes Relative: 0 %
Monocytes Absolute: 0.5 10*3/uL (ref 0.1–1.0)
Monocytes Relative: 7 %
Myelocytes: 0 %
Neutro Abs: 4.3 10*3/uL (ref 1.7–7.7)
Neutrophils Relative %: 57 %
Other: 0 %
Platelets: 346 10*3/uL (ref 150–400)
Promyelocytes Relative: 0 %
RBC: 6.52 MIL/uL — ABNORMAL HIGH (ref 3.87–5.11)
RDW: 22.1 % — ABNORMAL HIGH (ref 11.5–15.5)
Smear Review: ADEQUATE
WBC: 7.5 10*3/uL (ref 4.0–10.5)
nRBC: 0 % (ref 0.0–0.2)
nRBC: 0 /100 WBC

## 2022-05-13 NOTE — Progress Notes (Signed)
Hematology/Oncology Consult note [facility] Telephone:([phone] [phone]  Patient Care Team: Sallyanne Kuster, NP as PCP - General (Nurse Practitioner)   Name of the patient: Natasha Moreno  [id number]  09/09/1981    Reason for referral-microcytic anemia   Referring physician-Alyssa Tedd Sias, NP  Date of visit: 05/13/22   History of presenting illness-patient is a 41 year oldFemale referred for microcytic anemia her most recent CBC from 04/08/2022 showed white cell count of 6.4, H&H of 10.5/34 and a platelet count of 371.  MCV was low at 53 with an increased RBC volume of 6.44.  Of note patient has had chronic microcytic anemia with an MCV between 51-54 and hemoglobin between 10-11 at least dating back to 2019.  She states that she has been anemic since childhood.  Possible anemia in her mother and sister as well.  She is not aware of any personal or family history of thalassemia.  Denies any blood loss in her stool or urine.  Her recent iron studies from July 2023 were normal.  ECOG PS- 0  Pain scale- 0   Review of systems- Review of Systems  Constitutional:  Negative for chills, fever, malaise/fatigue and weight loss.  HENT:  Negative for congestion, ear discharge and nosebleeds.   Eyes:  Negative for blurred vision.  Respiratory:  Negative for cough, hemoptysis, sputum production, shortness of breath and wheezing.   Cardiovascular:  Negative for chest pain, palpitations, orthopnea and claudication.  Gastrointestinal:  Negative for abdominal pain, blood in stool, constipation, diarrhea, heartburn, melena, nausea and vomiting.  Genitourinary:  Negative for dysuria, flank pain, frequency, hematuria and urgency.  Musculoskeletal:  Negative for back pain, joint pain and myalgias.  Skin:  Negative for rash.  Neurological:  Negative for dizziness, tingling, focal weakness, seizures, weakness and headaches.  Endo/Heme/Allergies:  Does not  bruise/bleed easily.  Psychiatric/Behavioral:  Negative for depression and suicidal ideas. The patient does not have insomnia.     No Known Allergies  Patient Active Problem List   Diagnosis Date Noted   Anemia 05/13/2022   OSA (obstructive sleep apnea) 03/15/2021   Other hypersomnia 03/15/2021   Acute pain of left knee 08/12/2020   Baker cyst, left 08/12/2020   Flu vaccine need 08/12/2020   History of stroke 07/21/2018   Cerebrovascular accident (CVA) (HCC) 12/02/2017   Headache disorder 12/02/2017   Left sided numbness 11/09/2017   Subluxation of patellofemoral joint 10/05/2015     Past Medical History:  Diagnosis Date   Anemia    Hemoglobin E-A disorder (HCC)    Hyperlipidemia    Hypertension    Migraine    Morbid obesity (HCC)    Sleep apnea    uses CPAP   Stroke [facility])      Past Surgical History:  Procedure Laterality Date   CHOLECYSTECTOMY     TEE WITHOUT CARDIOVERSION N/A 11/11/2017   Procedure: TRANSESOPHAGEAL ECHOCARDIOGRAM (TEE);  Surgeon: Dalia Heading, MD;  Location: [facility] ORS;  Service: Cardiovascular;  Laterality: N/A;   TONSILLECTOMY AND ADENOIDECTOMY Bilateral 11/06/2021   Procedure: TONSILLECTOMY;  Surgeon: Geanie Logan, MD;  Location: [facility] ORS;  Service: ENT;  Laterality: Bilateral;   TUBAL LIGATION     WISDOM TOOTH EXTRACTION      Social History   Socioeconomic History   Marital status: Single    Spouse name: Not on file   Number of children: 2   Years of education: Not on file   Highest education level: High school graduate  Occupational History  Not on file  Tobacco Use   Smoking status: Former    Packs/day: 0.25    Types: Cigarettes    Quit date: 11/09/2017    Years since quitting: 4.5   Smokeless tobacco: Never   Tobacco comments:    approximately 4 cigarettes a day  Vaping Use   Vaping Use: Former  Substance and Sexual Activity   Alcohol use: Yes    Comment: every other weekend, not much    Drug use: No   Sexual activity:  Yes    Birth control/protection: Condom  Other Topics Concern   Not on file  Social History Narrative   Lives at home with her parents, brother and her children   Right handed   Drinks rare caffeine    Social Determinants of Health   Financial Resource Strain: Low Risk  (11/09/2017)   Overall Financial Resource Strain (CARDIA)    Difficulty of Paying Living Expenses: Not hard at all  Food Insecurity: No Food Insecurity (11/09/2017)   Hunger Vital Sign    Worried About Running Out of Food in the Last Year: Never true    Ran Out of Food in the Last Year: Never true  Transportation Needs: No Transportation Needs (01/06/2018)   PRAPARE - Administrator, Civil Service (Medical): No    Lack of Transportation (Non-Medical): No  Recent Concern: Transportation Needs - Unmet Transportation Needs (11/09/2017)   PRAPARE - Transportation    Lack of Transportation (Medical): Yes    Lack of Transportation (Non-Medical): Yes  Physical Activity: Sufficiently Active (11/09/2017)   Exercise Vital Sign    Days of Exercise per Week: 3 days    Minutes of Exercise per Session: 50 min  Stress: No Stress Concern Present (11/09/2017)   [employer] of Occupational Health - Occupational Stress Questionnaire    Feeling of Stress : Not at all  Social Connections: Unknown (11/09/2017)   Social Connection and Isolation Panel [NHANES]    Frequency of Communication with Friends and Family: More than three times a week    Frequency of Social Gatherings with Friends and Family: Once a week    Attends Religious Services: Never    Database administrator or Organizations: No    Attends Engineer, structural: Never    Marital Status: Not on file  Intimate Partner Violence: Not on file     Family History  Problem Relation Age of Onset   Kidney failure Father    Heart attack Father        open heart surgery   Hypertension Father    Diabetes Father    Gout Father    Cataracts Father       Current Outpatient Medications:    albuterol (PROAIR HFA) 108 (90 Base) MCG/ACT inhaler, TAKE 2 PUFFS BY MOUTH EVERY 6 HOURS AS NEEDED FOR WHEEZE OR SHORTNESS OF BREATH, Disp: 8.5 each, Rfl: 1   atorvastatin (LIPITOR) 10 MG tablet, Take 1 tablet by mouth daily., Disp: , Rfl:    cetirizine (ZYRTEC) 10 MG tablet, Take 10 mg by mouth daily as needed for allergies. , Disp: , Rfl:    famotidine (PEPCID) 20 MG tablet, Take 1 tablet (20 mg total) by mouth 2 (two) times daily., Disp: 180 tablet, Rfl: 1   hydrochlorothiazide (HYDRODIURIL) 12.5 MG tablet, Take 1 tablet (12.5 mg total) by mouth daily., Disp: 90 tablet, Rfl: 1   meloxicam (MOBIC) 15 MG tablet, TAKE 1 TABLET BY MOUTH EVERY DAY WITH A  MEAL, Disp: , Rfl:    Vitamin D, Ergocalciferol, (DRISDOL) 1.25 MG (50000 UNIT) CAPS capsule, Take 1 capsule (50,000 Units total) by mouth every 7 (seven) days., Disp: 12 capsule, Rfl: 1   Semaglutide-Weight Management 1.7 MG/0.75ML SOAJ, Inject 1.7 mg into the skin once a week. (Patient not taking: Reported on 05/13/2022), Disp: 3 mL, Rfl: 3   traZODone (DESYREL) 50 MG tablet, Take 50-100 mg by mouth at bedtime as needed. (Patient not taking: Reported on 05/13/2022), Disp: , Rfl:    Physical exam:  Vitals:   05/13/22 1059  BP: 115/87  Pulse: 81  Resp: 18  Temp: 97.7 F (36.5 C)  SpO2: 99%  Weight: 229 lb (103.9 kg)   Physical Exam Constitutional:      General: She is not in acute distress. Cardiovascular:     Rate and Rhythm: Normal rate and regular rhythm.     Heart sounds: Normal heart sounds.  Pulmonary:     Effort: Pulmonary effort is normal.     Breath sounds: Normal breath sounds.  Abdominal:     General: Bowel sounds are normal.     Palpations: Abdomen is soft.  Skin:    General: Skin is warm and dry.  Neurological:     Mental Status: She is alert and oriented to person, place, and time.           Latest Ref Rng & Units 04/08/2022    1:47 PM  CMP  Total Protein 6.0 - 8.5  g/dL 6.7   Total Bilirubin 0.0 - 1.2 mg/dL 0.7   Alkaline Phos 44 - 121 IU/L 67   AST 0 - 40 IU/L 50   ALT 0 - 32 IU/L 83       Latest Ref Rng & Units 05/13/2022   12:13 PM  CBC  WBC 4.0 - 10.5 K/uL 7.5   Hemoglobin 12.0 - 15.0 g/dL 33.3   Hematocrit 54.5 - 46.0 % 34.9   Platelets 150 - 400 K/uL 346     Assessment and plan- Patient is a 41 y.o. female referred for microcytic anemia  Patient has had longstanding microcytic anemia with a hemoglobin around 10-11 dating back to 2019.  MCV is disproportionately low at 53 with an increased RBC volume.  This is highly suggestive of thalassemia.  I am checking a CBC with differential, hemoglobin electrophoresis and alpha thalassemia genotype today.  Her iron studies were normal and therefore her anemia is not secondary to iron deficiency and will not improve with oral or IV iron.  I will see her back in 2 weeks to discuss the results of her blood work   Thank you for this kind referral and the opportunity to participate in the care of this patient   Visit Diagnosis 1. Microcytic anemia     Dr. Owens Shark, MD, MPH [facility] at [facility] [phone] 05/13/2022

## 2022-05-15 ENCOUNTER — Ambulatory Visit: Payer: BC Managed Care – PPO | Admitting: Internal Medicine

## 2022-05-15 LAB — HGB FRACTIONATION BY HPLC
Hgb A: 79.6 % — ABNORMAL LOW (ref 96.4–98.8)
Hgb C: 0 %
Hgb E: 16.7 % — ABNORMAL HIGH
Hgb F: 0 % (ref 0.0–2.0)
Hgb S: 0 %
Hgb Variant: 0 %

## 2022-05-15 LAB — HGB FRACTIONATION CASCADE: Hgb A2: 3.7 % — ABNORMAL HIGH (ref 1.8–3.2)

## 2022-05-21 DIAGNOSIS — G4733 Obstructive sleep apnea (adult) (pediatric): Secondary | ICD-10-CM | POA: Diagnosis not present

## 2022-05-23 LAB — ALPHA-THALASSEMIA GENOTYPR

## 2022-05-28 ENCOUNTER — Ambulatory Visit: Payer: BC Managed Care – PPO

## 2022-06-03 ENCOUNTER — Ambulatory Visit: Payer: BC Managed Care – PPO | Admitting: Nurse Practitioner

## 2022-06-03 ENCOUNTER — Encounter: Payer: Self-pay | Admitting: Nurse Practitioner

## 2022-06-03 ENCOUNTER — Telehealth: Payer: Self-pay | Admitting: Oncology

## 2022-06-03 VITALS — BP 115/75 | HR 96 | Temp 98.4°F | Resp 16 | Ht 60.0 in | Wt 229.6 lb

## 2022-06-03 DIAGNOSIS — E559 Vitamin D deficiency, unspecified: Secondary | ICD-10-CM

## 2022-06-03 DIAGNOSIS — I10 Essential (primary) hypertension: Secondary | ICD-10-CM | POA: Diagnosis not present

## 2022-06-03 DIAGNOSIS — Z6841 Body Mass Index (BMI) 40.0 and over, adult: Secondary | ICD-10-CM

## 2022-06-03 DIAGNOSIS — E782 Mixed hyperlipidemia: Secondary | ICD-10-CM | POA: Diagnosis not present

## 2022-06-03 DIAGNOSIS — E538 Deficiency of other specified B group vitamins: Secondary | ICD-10-CM

## 2022-06-03 DIAGNOSIS — K760 Fatty (change of) liver, not elsewhere classified: Secondary | ICD-10-CM | POA: Diagnosis not present

## 2022-06-03 MED ORDER — REPATHA PUSHTRONEX SYSTEM 420 MG/3.5ML ~~LOC~~ SOCT: 420.0000 mg | SUBCUTANEOUS | 2 refills | Status: DC

## 2022-06-03 MED ORDER — REPATHA SURECLICK 140 MG/ML ~~LOC~~ SOAJ: 140.0000 mg | SUBCUTANEOUS | 3 refills | Status: DC

## 2022-06-03 NOTE — Telephone Encounter (Signed)
ok 

## 2022-06-03 NOTE — Progress Notes (Signed)
[facility] [place], [place] [address]  Internal MEDICINE  Office Visit Note  Patient Name: Natasha Moreno  [id number]  [id number]  Date of Service: 06/03/2022  Chief Complaint  Patient presents with   Hyperlipidemia   Hypertension    Follow up weight loss    HPI Natasha Moreno presents for a follow up visit for hypertension, weight loss and hyperlipidemia.  Hypertension -- BP controlled with current medications Hyperlipidemia -- has fatty liver and hapetomegaly, statins, zetia and fenofibrate are contraindicated in liver disease.  Weight loss management -- taking wegovy, no significant decrease in weight since previous office visit, but still feeling like clothes are fitting better.  Has appt with hematology on Thursday about her abnormal labs, has already done the additional labs, possible type of thalassemia.     Current Medication: Outpatient Encounter Medications as of 06/03/2022  Medication Sig   albuterol (PROAIR HFA) 108 (90 Base) MCG/ACT inhaler TAKE 2 PUFFS BY MOUTH EVERY 6 HOURS AS NEEDED FOR WHEEZE OR SHORTNESS OF BREATH   cetirizine (ZYRTEC) 10 MG tablet Take 10 mg by mouth daily as needed for allergies.    Evolocumab (REPATHA SURECLICK) [id number] MG/ML SOAJ Inject 140 mg into the skin every 14 (fourteen) days.   famotidine (PEPCID) 20 MG tablet Take 1 tablet (20 mg total) by mouth 2 (two) times daily.   hydrochlorothiazide (HYDRODIURIL) 12.5 MG tablet Take 1 tablet (12.5 mg total) by mouth daily.   traZODone (DESYREL) 50 MG tablet Take 50-100 mg by mouth at bedtime as needed.   Vitamin D, Ergocalciferol, (DRISDOL) 1.25 MG (50000 UNIT) CAPS capsule Take 1 capsule (50,000 Units total) by mouth every 7 (seven) days.   [DISCONTINUED] atorvastatin (LIPITOR) 10 MG tablet Take 1 tablet by mouth daily.   [DISCONTINUED] Evolocumab with Infusor ([place]) 420 MG/3.5ML SOCT Inject 420 mg into the skin every 30 (thirty) days.   [DISCONTINUED]  meloxicam (MOBIC) 15 MG tablet TAKE 1 TABLET BY MOUTH EVERY DAY WITH A MEAL   [DISCONTINUED] Semaglutide-Weight Management 1.7 MG/0.75ML SOAJ Inject 1.7 mg into the skin once a week.   No facility-administered encounter medications on file as of 06/03/2022.    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[place])    Hyperlipidemia    Hypertension    Migraine    Morbid obesity ([place])    Sleep apnea    uses CPAP   Stroke [facility])     Family History: Family History  Problem Relation Age of Onset   Kidney failure Father    Heart attack Father        open heart surgery   Hypertension Father    Diabetes Father    Gout Father    Cataracts Father     Social History   Socioeconomic History   Marital status: Single    Spouse name: Not on file   Number of children: 2   Years of education: Not  on file   Highest education level: High school graduate  Occupational History   Not on file  Tobacco Use   Smoking status: Former    Packs/day: 0.25    Types: Cigarettes    Quit date: 11/09/2017    Years since quitting: 4.7   Smokeless tobacco: Never  Vaping Use   Vaping Use: Former  Substance and Sexual Activity   Alcohol use: Yes    Comment: every other weekend, not much    Drug use: No   Sexual activity: Yes    Birth control/protection: Condom  Other Topics Concern   Not on file  Social History Narrative   Lives at home with her parents,  brother and her children   Right handed   Drinks rare caffeine    Social Determinants of Health   Financial Resource Strain: Low Risk  (11/09/2017)   Overall Financial Resource Strain (CARDIA)    Difficulty of Paying Living Expenses: Not hard at all  Food Insecurity: No Food Insecurity (11/09/2017)   Hunger Vital Sign    Worried About Running Out of Food in the Last Year: Never true    Ran Out of Food in the Last Year: Never true  Transportation Needs: No Transportation Needs (01/06/2018)   PRAPARE - Administrator, Civil Service (Medical): No    Lack of Transportation (Non-Medical): No  Recent Concern: Transportation Needs - Unmet Transportation Needs (11/09/2017)   PRAPARE - Transportation    Lack of Transportation (Medical): Yes    Lack of Transportation (Non-Medical): Yes  Physical Activity: Sufficiently Active (11/09/2017)   Exercise Vital Sign    Days of Exercise per Week: 3 days    Minutes of Exercise per Session: 50 min  Stress: No Stress Concern Present (11/09/2017)   [employer] of Occupational Health - Occupational Stress Questionnaire    Feeling of Stress : Not at all  Social Connections: Unknown (11/09/2017)   Social Connection and Isolation Panel [NHANES]    Frequency of Communication with Friends and Family: More than three times a week    Frequency of Social Gatherings with Friends and Family: Once a week    Attends Religious Services: Never    Database administrator or Organizations: No    Attends Engineer, structural: Never    Marital Status: Not on file  Intimate Partner Violence: Not on file      Review of Systems  Constitutional:  Negative for chills, fatigue and unexpected weight change.  HENT:  Negative for congestion, rhinorrhea, sneezing and sore throat.   Eyes:  Negative for redness.  Respiratory:  Negative for cough, chest tightness and shortness of breath.   Cardiovascular:  Negative for chest pain and palpitations.   Gastrointestinal:  Negative for abdominal pain, constipation, diarrhea, nausea and vomiting.  Genitourinary:  Negative for dysuria and frequency.  Musculoskeletal:  Negative for arthralgias, back pain, joint swelling and neck pain.  Skin:  Negative for rash.  Neurological: Negative.  Negative for tremors and numbness.  Hematological:  Negative for adenopathy. Does not bruise/bleed easily.  Psychiatric/Behavioral:  Negative for behavioral problems (Depression), sleep disturbance and suicidal ideas. The patient is not nervous/anxious.     Vital Signs: BP 115/75   Pulse 96   Temp 98.4 F (36.9 C)   Resp 16   Ht 5' (1.524 m)   Wt 229 lb 9.6 oz (104.1 kg)   SpO2 98%   BMI 44.84 kg/m    Physical Exam Vitals reviewed.  Constitutional:      General: She is not in acute distress.    Appearance: Normal appearance. She is obese. She is not ill-appearing.  HENT:     Head: Normocephalic and atraumatic.  Eyes:     Pupils: Pupils are equal, round, and reactive to light.  Cardiovascular:     Rate and Rhythm: Normal rate and regular rhythm.  Pulmonary:     Effort: Pulmonary effort is normal. No respiratory distress.  Neurological:     Mental Status: She is alert and oriented to person, place, and time.  Psychiatric:        Mood and Affect: Mood normal.        Behavior: Behavior normal.        Assessment/Plan: 1. Essential hypertension Stable, continue current medications as prescribed  2. Mixed hyperlipidemia Repatha prescribed. Statins, zetia, fenofibrate and gemfibrozil are contraindicated in liver disease. With history of stroke, it is important for her to have some medication on board that will keep her cholesterol levels down.   - Evolocumab (REPATHA SURECLICK) 140 MG/ML SOAJ; Inject 140 mg into the skin every 14 (fourteen) days.  Dispense: 2 mL; Refill: 3  3. Fatty liver Most cholesterol medications are contraindicated in liver disease, will try repatha if insurance will  approve it - Evolocumab (REPATHA SURECLICK) 140 MG/ML SOAJ; Inject 140 mg into the skin every 14 (fourteen) days.  Dispense: 2 mL; Refill: 3  4. Class 3 severe obesity due to excess calories with serious comorbidity and body mass index (BMI) of 40.0 to 44.9 in adult [facility]) Continue wegovy as prescribed with the diet and lifestyle modifications as previously discussed   General Counseling: Natasha Moreno verbalizes understanding of the findings of todays visit and agrees with plan of treatment. I have discussed any further diagnostic evaluation that may be needed or ordered today. We also reviewed her medications today. she has been encouraged to call the office with any questions or concerns that should arise related to todays visit.    No orders of the defined types were placed in this encounter.   Meds ordered this encounter  Medications   DISCONTD: Evolocumab with Infusor (REPATHA PUSHTRONEX SYSTEM) 420 MG/3.5ML SOCT    Sig: Inject 420 mg into the skin every 30 (thirty) days.    Dispense:  3.6 mL    Refill:  2   Evolocumab (REPATHA SURECLICK) 140 MG/ML SOAJ    Sig: Inject 140 mg into the skin every 14 (fourteen) days.    Dispense:  2 mL    Refill:  3    Return for previously scheduled, CPE, Garmon Dehn PCP in november.   Total time spent:30 Minutes Time spent includes review of chart, medications, test results, and follow up plan with the patient.   [place] Controlled Substance Database was reviewed by me.  This patient was seen by Sallyanne Kuster, FNP-C in collaboration with Dr. Beverely Risen as a part of collaborative care agreement.   Natasha Moreno R. Tedd Sias, MSN, FNP-C Internal medicine

## 2022-06-03 NOTE — Progress Notes (Signed)
Pt was given instructions on how to administer the injection of Repatha, and informed pt that they are to be taken every 2 weeks (14 days).  Pt was given a sample of repatha in the office and she was watched and guided thru the steps and pt injected herself. Pt advised that she felt comfortable to inject herself with new prescription when it is time for second dose.   Pt given papers out of sample box with pictured instructions to have for future injections.  Prescription was sent to pharmacy.

## 2022-06-03 NOTE — Telephone Encounter (Signed)
pt called in wanting to know if her appt for tomorrow can be virtual..KJ

## 2022-06-04 ENCOUNTER — Inpatient Hospital Stay: Payer: BC Managed Care – PPO | Admitting: Oncology

## 2022-06-05 ENCOUNTER — Other Ambulatory Visit: Payer: Self-pay | Admitting: *Deleted

## 2022-06-05 ENCOUNTER — Telehealth: Payer: Self-pay | Admitting: *Deleted

## 2022-06-05 ENCOUNTER — Inpatient Hospital Stay: Payer: BC Managed Care – PPO | Attending: Oncology | Admitting: Oncology

## 2022-06-05 ENCOUNTER — Encounter: Payer: Self-pay | Admitting: Oncology

## 2022-06-05 DIAGNOSIS — D560 Alpha thalassemia: Secondary | ICD-10-CM

## 2022-06-05 MED ORDER — FOLIC ACID 1 MG PO TABS: 1.0000 mg | ORAL_TABLET | Freq: Every day | ORAL | 3 refills | Status: DC

## 2022-06-05 NOTE — Progress Notes (Signed)
I connected with Natasha Moreno on 06/05/22 at  9:00 AM EDT by video enabled telemedicine visit and verified that I am speaking with the correct person using two identifiers.   I discussed the limitations, risks, security and privacy concerns of performing an evaluation and management service by telemedicine and the availability of in-person appointments. I also discussed with the patient that there may be a patient responsible charge related to this service. The patient expressed understanding and agreed to proceed.  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  MCV was low at 53 with an increased RBC volume of 6.44.  Of note patient has had chronic microcytic anemia with an MCV between 51-54 and hemoglobin between 10-11 at least dating back to 2019.  She states that she has been anemic since childhood.  Possible anemia in her mother and sister as well.  She is not aware of any personal or family history of thalassemia.  She has had cholecystectomy in the past denies any blood loss in her stool or urine.  Her recent iron studies from July 2023 were normal.   Hemoglobin electrophoresis showed elevated hemoglobin A2 at 3.7%.  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   Interval history patient is currently doing well and denies any specific complaints at this time   Review of Systems  Constitutional:  Negative for  chills, fever, malaise/fatigue and weight loss.  HENT:  Negative for congestion, ear discharge and nosebleeds.   Eyes:  Negative for blurred vision.  Respiratory:  Negative for cough, hemoptysis, sputum production, shortness of breath and wheezing.   Cardiovascular:  Negative for chest pain, palpitations, orthopnea and claudication.  Gastrointestinal:  Negative for abdominal pain, blood in stool, constipation, diarrhea, heartburn, melena, nausea and vomiting.  Genitourinary:  Negative for dysuria, flank pain, frequency, hematuria and urgency.  Musculoskeletal:  Negative for back pain, joint pain and myalgias.  Skin:  Negative for rash.  Neurological:  Negative for dizziness, tingling, focal weakness, seizures, weakness and headaches.  Endo/Heme/Allergies:  Does not bruise/bleed easily.  Psychiatric/Behavioral:  Negative for depression and suicidal ideas. The patient does not have insomnia.     No Known Allergies  Past Medical History:  Diagnosis Date   Anemia    Hemoglobin E-A disorder (HCC)    Hyperlipidemia    Hypertension    Migraine    Morbid obesity (HCC)    Sleep apnea    uses CPAP   Stroke [facility])     Past Surgical History:  Procedure Laterality Date   CHOLECYSTECTOMY     TEE WITHOUT CARDIOVERSION N/A 11/11/2017   Procedure: TRANSESOPHAGEAL ECHOCARDIOGRAM (TEE);  Surgeon: Dalia Heading, MD;  Location: [facility] ORS;  Service: Cardiovascular;  Laterality: N/A;   TONSILLECTOMY AND ADENOIDECTOMY Bilateral 11/06/2021   Procedure: TONSILLECTOMY;  Surgeon: Geanie Logan, MD;  Location: [facility] ORS;  Service: ENT;  Laterality: Bilateral;   TUBAL LIGATION     WISDOM TOOTH EXTRACTION      Social History  Socioeconomic History   Marital status: Single    Spouse name: Not on file   Number of children: 2   Years of education: Not on file   Highest education level: High school graduate  Occupational History   Not on file  Tobacco Use   Smoking status: Former    Packs/day: 0.25     Types: Cigarettes    Quit date: 11/09/2017    Years since quitting: 4.5   Smokeless tobacco: Never  Vaping Use   Vaping Use: Former  Substance and Sexual Activity   Alcohol use: Yes    Comment: every other weekend, not much    Drug use: No   Sexual activity: Yes    Birth control/protection: Condom  Other Topics Concern   Not on file  Social History Narrative   Lives at home with her parents, brother and her children   Right handed   Drinks rare caffeine    Social Determinants of Health   Financial Resource Strain: Low Risk  (11/09/2017)   Overall Financial Resource Strain (CARDIA)    Difficulty of Paying Living Expenses: Not hard at all  Food Insecurity: No Food Insecurity (11/09/2017)   Hunger Vital Sign    Worried About Running Out of Food in the Last Year: Never true    Ran Out of Food in the Last Year: Never true  Transportation Needs: No Transportation Needs (01/06/2018)   PRAPARE - Administrator, Civil Service (Medical): No    Lack of Transportation (Non-Medical): No  Recent Concern: Transportation Needs - Unmet Transportation Needs (11/09/2017)   PRAPARE - Transportation    Lack of Transportation (Medical): Yes    Lack of Transportation (Non-Medical): Yes  Physical Activity: Sufficiently Active (11/09/2017)   Exercise Vital Sign    Days of Exercise per Week: 3 days    Minutes of Exercise per Session: 50 min  Stress: No Stress Concern Present (11/09/2017)   [employer] of Occupational Health - Occupational Stress Questionnaire    Feeling of Stress : Not at all  Social Connections: Unknown (11/09/2017)   Social Connection and Isolation Panel [NHANES]    Frequency of Communication with Friends and Family: More than three times a week    Frequency of Social Gatherings with Friends and Family: Once a week    Attends Religious Services: Never    Database administrator or Organizations: No    Attends Engineer, structural: Never    Marital Status:  Not on file  Intimate Partner Violence: Not on file    Family History  Problem Relation Age of Onset   Kidney failure Father    Heart attack Father        open heart surgery   Hypertension Father    Diabetes Father    Gout Father    Cataracts Father      Current Outpatient Medications:    albuterol (PROAIR HFA) 108 (90 Base) MCG/ACT inhaler, TAKE 2 PUFFS BY MOUTH EVERY 6 HOURS AS NEEDED FOR WHEEZE OR SHORTNESS OF BREATH, Disp: 8.5 each, Rfl: 1   cetirizine (ZYRTEC) 10 MG tablet, Take 10 mg by mouth daily as needed for allergies. , Disp: , Rfl:    Evolocumab (REPATHA SURECLICK) 140 MG/ML SOAJ, Inject 140 mg into the skin every 14 (fourteen) days., Disp: 2 mL, Rfl: 3   famotidine (PEPCID) 20 MG tablet, Take 1 tablet (20 mg total) by mouth 2 (two) times daily., Disp: 180 tablet, Rfl: 1  hydrochlorothiazide (HYDRODIURIL) 12.5 MG tablet, Take 1 tablet (12.5 mg total) by mouth daily., Disp: 90 tablet, Rfl: 1   Semaglutide-Weight Management 1.7 MG/0.75ML SOAJ, Inject 1.7 mg into the skin once a week., Disp: 3 mL, Rfl: 3   Vitamin D, Ergocalciferol, (DRISDOL) 1.25 MG (50000 UNIT) CAPS capsule, Take 1 capsule (50,000 Units total) by mouth every 7 (seven) days., Disp: 12 capsule, Rfl: 1   folic acid (FOLVITE) 1 MG tablet, Take 1 tablet (1 mg total) by mouth daily., Disp: 30 tablet, Rfl: 3   traZODone (DESYREL) 50 MG tablet, Take 50-100 mg by mouth at bedtime as needed. (Patient not taking: Reported on 06/05/2022), Disp: , Rfl:   No results found.  No images are attached to the encounter.      Latest Ref Rng & Units 04/08/2022    1:47 PM  CMP  Total Protein 6.0 - 8.5 g/dL 6.7   Total Bilirubin 0.0 - 1.2 mg/dL 0.7   Alkaline Phos 44 - 121 IU/L 67   AST 0 - 40 IU/L 50   ALT 0 - 32 IU/L 83       Latest Ref Rng & Units 05/13/2022   12:13 PM  CBC  WBC 4.0 - 10.5 K/uL 7.5   Hemoglobin 12.0 - 15.0 g/dL 32.2   Hematocrit 02.5 - 46.0 % 34.9   Platelets 150 - 400 K/uL 346       Observation/objective: Appears in no acute distress over video visit today.  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  Patient has not had any significant problems in childhood.  She has had mild chronic anemia with a hemoglobin that fluctuates between 10.5-11.5.  She has not required any blood transfusions.  She has had a cholecystectomy in the past.  Discussed that hemoglobin H disease is autosomal recessive.  Discussed the etiopathogenesis of alpha thalassemia in detail.  Discussed potential complications from hemoglobin H disease in the future including extramedullary hematopoiesis causing hepatosplenomegaly, worsening bone health iron overload.  Presently patient does not have a significantly elevated ferritin level.  I will plan to check a CMP, LDH, haptoglobin, reticulocyte count and TSH to assess if she has any evidence of iron overload and ongoing hemolysis.  I will also check a baseline bone density scan to assess for osteopenia.  I will send her a prescription for folic acid 1 mg daily.  Avoid taking iron supplements as she does not have any evidence of iron deficiency.   Patient has 2 children 67 and 82 years of age who are doing well and she does not remember them having any blood disorders.  However patient will need genetic counseling and her kids need to be tested for thalassemia as well.  I would recommend that she should get a second opinion with Dr. Clarene Duke from [facility].  I will see her back in 3 months with labs cbc with diff and ferritin   Follow-up instructions: As above  I discussed the assessment and treatment plan with the patient. The patient was provided an opportunity to  ask questions and all were answered. The patient agreed with the plan and demonstrated an understanding of the instructions.   The patient was advised to call back or seek an in-person evaluation if  the symptoms worsen or if the condition fails to improve as anticipated.  I provided 20 minutes of face-to-face video visit time during this encounter, and > 50% was spent counseling as documented under my assessment & plan.  Visit Diagnosis: 1. Hemoglobin H disease (HCC)     Dr. Owens Shark, MD, MPH [facility] Tel- [phone] 06/05/2022 11:42 AM

## 2022-06-05 NOTE — Progress Notes (Signed)
Pt stays tired all the time - sleeps well. Has constipation at times- takes no meds for it. Tries to drink fluids and eat vegetables

## 2022-06-05 NOTE — Progress Notes (Signed)
Orders put in

## 2022-06-05 NOTE — Progress Notes (Signed)
Orders in 

## 2022-06-05 NOTE — Telephone Encounter (Signed)
Sent referral for this pt to see dr. Clarene Duke at [facility]. I faxed labd, md notes, insurance and demographics  and the transmission went through

## 2022-06-08 DIAGNOSIS — E785 Hyperlipidemia, unspecified: Secondary | ICD-10-CM | POA: Insufficient documentation

## 2022-06-09 ENCOUNTER — Ambulatory Visit: Payer: BC Managed Care – PPO | Admitting: Internal Medicine

## 2022-06-10 ENCOUNTER — Ambulatory Visit: Payer: BC Managed Care – PPO | Admitting: Internal Medicine

## 2022-06-12 ENCOUNTER — Inpatient Hospital Stay: Payer: BC Managed Care – PPO

## 2022-06-17 ENCOUNTER — Ambulatory Visit: Payer: BC Managed Care – PPO | Admitting: Internal Medicine

## 2022-06-21 DIAGNOSIS — G4733 Obstructive sleep apnea (adult) (pediatric): Secondary | ICD-10-CM | POA: Diagnosis not present

## 2022-06-24 ENCOUNTER — Ambulatory Visit: Payer: BC Managed Care – PPO | Admitting: Internal Medicine

## 2022-06-26 ENCOUNTER — Telehealth: Payer: Self-pay

## 2022-06-26 NOTE — Telephone Encounter (Signed)
Reached out to [facility] to check on status of referral for hemoglobin H disease. Pt has been scheduled to see Dr. Redmond Pulling on 12/22 at 10am.

## 2022-07-01 ENCOUNTER — Inpatient Hospital Stay: Payer: BC Managed Care – PPO | Attending: Oncology

## 2022-07-05 ENCOUNTER — Encounter: Payer: Self-pay | Admitting: Nurse Practitioner

## 2022-07-07 DIAGNOSIS — Z20822 Contact with and (suspected) exposure to covid-19: Secondary | ICD-10-CM | POA: Diagnosis not present

## 2022-07-07 DIAGNOSIS — Z03818 Encounter for observation for suspected exposure to other biological agents ruled out: Secondary | ICD-10-CM | POA: Diagnosis not present

## 2022-07-08 ENCOUNTER — Encounter: Payer: Self-pay | Admitting: Internal Medicine

## 2022-07-08 ENCOUNTER — Telehealth: Payer: Self-pay

## 2022-07-08 ENCOUNTER — Ambulatory Visit: Payer: BC Managed Care – PPO | Admitting: Internal Medicine

## 2022-07-08 VITALS — BP 130/82 | HR 95 | Temp 98.3°F | Resp 16 | Ht 60.0 in | Wt 231.0 lb

## 2022-07-08 DIAGNOSIS — K760 Fatty (change of) liver, not elsewhere classified: Secondary | ICD-10-CM

## 2022-07-08 DIAGNOSIS — R0602 Shortness of breath: Secondary | ICD-10-CM | POA: Diagnosis not present

## 2022-07-08 NOTE — Patient Instructions (Signed)
Obesity, Adult ?Obesity is having too much body fat. Being obese means that your weight is more than what is healthy for you.  ?BMI (body mass index) is a number that explains how much body fat you have. If you have a BMI of 30 or more, you are obese. ?Obesity can cause serious health problems, such as: ?Stroke. ?Coronary artery disease (CAD). ?Type 2 diabetes. ?Some types of cancer. ?High blood pressure (hypertension). ?High cholesterol. ?Gallbladder stones. ?Obesity can also contribute to: ?Osteoarthritis. ?Sleep apnea. ?Infertility problems. ?What are the causes? ?Eating meals each day that are high in calories, sugar, and fat. ?Drinking a lot of drinks that have sugar in them. ?Being born with genes that may make you more likely to become obese. ?Having a medical condition that causes obesity. ?Taking certain medicines. ?Sitting a lot (having a sedentary lifestyle). ?Not getting enough sleep. ?What increases the risk? ?Having a family history of obesity. ?Living in an area with limited access to: ?Parks, recreation centers, or sidewalks. ?Healthy food choices, such as grocery stores and farmers' markets. ?What are the signs or symptoms? ?The main sign is having too much body fat. ?How is this treated? ?Treatment for this condition often includes changing your lifestyle. Treatment may include: ?Changing your diet. This may include making a healthy meal plan. ?Exercise. This may include activity that causes your heart to beat faster (aerobic exercise) and strength training. Work with your doctor to design a program that works for you. ?Medicine to help you lose weight. This may be used if you are not able to lose one pound a week after 6 weeks of healthy eating and more exercise. ?Treating conditions that cause the obesity. ?Surgery. Options may include gastric banding and gastric bypass. This may be done if: ?Other treatments have not helped to improve your condition. ?You have a BMI of 40 or higher. ?You have  life-threatening health problems related to obesity. ?Follow these instructions at home: ?Eating and drinking ? ?Follow advice from your doctor about what to eat and drink. Your doctor may tell you to: ?Limit fast food, sweets, and processed snack foods. ?Choose low-fat options. For example, choose low-fat milk instead of whole milk. ?Eat five or more servings of fruits or vegetables each day. ?Eat at home more often. This gives you more control over what you eat. ?Choose healthy foods when you eat out. ?Learn to read food labels. This will help you learn how much food is in one serving. ?Keep low-fat snacks available. ?Avoid drinks that have a lot of sugar in them. These include soda, fruit juice, iced tea with sugar, and flavored milk. ?Drink enough water to keep your pee (urine) pale yellow. ?Do not go on fad diets. ?Physical activity ?Exercise often, as told by your doctor. Most adults should get up to 150 minutes of moderate-intensity exercise every week.Ask your doctor: ?What types of exercise are safe for you. ?How often you should exercise. ?Warm up and stretch before being active. ?Do slow stretching after being active (cool down). ?Rest between times of being active. ?Lifestyle ?Work with your doctor and a food expert (dietitian) to set a weight-loss goal that is best for you. ?Limit your screen time. ?Find ways to reward yourself that do not involve food. ?Do not drink alcohol if: ?Your doctor tells you not to drink. ?You are pregnant, may be pregnant, or are planning to become pregnant. ?If you drink alcohol: ?Limit how much you have to: ?0-1 drink a day for women. ?0-2 drinks   a day for men. ?Know how much alcohol is in your drink. In the [place] one drink equals one 12 oz bottle of beer (355 mL), one 5 oz glass of wine (148 mL), or one 1? oz glass of hard liquor (44 mL). ?General instructions ?Keep a weight-loss journal. This can help you keep track of: ?The food that you eat. ?How much exercise you  get. ?Take over-the-counter and prescription medicines only as told by your doctor. ?Take vitamins and supplements only as told by your doctor. ?Think about joining a support group. ?Pay attention to your mental health as obesity can lead to depression or self esteem issues. ?Keep all follow-up visits. ?Contact a doctor if: ?You cannot meet your weight-loss goal after you have changed your diet and lifestyle for 6 weeks. ?You are having trouble breathing. ?Summary ?Obesity is having too much body fat. ?Being obese means that your weight is more than what is healthy for you. ?Work with your doctor to set a weight-loss goal. ?Get regular exercise as told by your doctor. ?This information is not intended to replace advice given to you by your health care provider. Make sure you discuss any questions you have with your health care provider. ?Document Revised: 04/16/2021 Document Reviewed: 04/16/2021 ?Elsevier Patient Education ? 2023 [employer] ? ?

## 2022-07-08 NOTE — Progress Notes (Signed)
[facility] [place], [place] [address]  Pulmonary Sleep Medicine   Office Visit Note  Patient Name: Natasha Moreno DOB: [birth year] MRN [id number]  Date of Service: 07/08/2022  Complaints/HPI: She is here after a sleep study was done and did not show significant OSA. She is trying to work on losing weight. She states that she is ahving a difficult time. She has no chest pain. She also had some labwork done which reveals presence of fatty liver. This is being worked up by her PCP. She states that she is trying to work on weigh tloss. She has been trying to cut off carbs and has been trying to cut sodas  ROS  General: (-) fever, (-) chills, (-) night sweats, (-) weakness Skin: (-) rashes, (-) itching,. Eyes: (-) visual changes, (-) redness, (-) itching. Nose and Sinuses: (-) nasal stuffiness or itchiness, (-) postnasal drip, (-) nosebleeds, (-) sinus trouble. Mouth and Throat: (-) sore throat, (-) hoarseness. Neck: (-) swollen glands, (-) enlarged thyroid, (-) neck pain. Respiratory: - cough, (-) bloody sputum, - shortness of breath, - wheezing. Cardiovascular: - ankle swelling, (-) chest pain. Lymphatic: (-) lymph node enlargement. Neurologic: (-) numbness, (-) tingling. Psychiatric: (-) anxiety, (-) depression   Current Medication: Outpatient Encounter Medications as of 07/08/2022  Medication Sig   albuterol (PROAIR HFA) 108 (90 Base) MCG/ACT inhaler TAKE 2 PUFFS BY MOUTH EVERY 6 HOURS AS NEEDED FOR WHEEZE OR SHORTNESS OF BREATH   cetirizine (ZYRTEC) 10 MG tablet Take 10 mg by mouth daily as needed for allergies.    Evolocumab (REPATHA SURECLICK) 389 MG/ML SOAJ Inject 140 mg into the skin every 14 (fourteen) days.   famotidine (PEPCID) 20 MG tablet Take 1 tablet (20 mg total) by mouth 2 (two) times daily.   folic acid (FOLVITE) 1 MG tablet Take 1 tablet (1 mg total) by mouth daily.   hydrochlorothiazide (HYDRODIURIL) 12.5 MG tablet Take 1 tablet  (12.5 mg total) by mouth daily.   Semaglutide-Weight Management 1.7 MG/0.75ML SOAJ Inject 1.7 mg into the skin once a week.   traZODone (DESYREL) 50 MG tablet Take 50-100 mg by mouth at bedtime as needed.   Vitamin D, Ergocalciferol, (DRISDOL) 1.25 MG (50000 UNIT) CAPS capsule Take 1 capsule (50,000 Units total) by mouth every 7 (seven) days.   No facility-administered encounter medications on file as of 07/08/2022.    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[place])    Hyperlipidemia    Hypertension    Migraine    Morbid obesity ([place])    Sleep apnea    uses CPAP   Stroke [facility])     Family History: Family History  Problem Relation Age of Onset   Kidney failure Father    Heart attack Father        open heart surgery   Hypertension Father    Diabetes Father    Gout Father    Cataracts Father  Social History: Social History   Socioeconomic History   Marital status: Single    Spouse name: Not on file   Number of children: 2   Years of education: Not on file   Highest education level: High school graduate  Occupational History   Not on file  Tobacco Use   Smoking status: Former    Packs/day: 0.25    Types: Cigarettes    Quit date: 11/09/2017    Years since quitting: 4.6   Smokeless tobacco: Never  Vaping Use   Vaping Use: Former  Substance and Sexual Activity   Alcohol use: Yes    Comment: every other weekend, not  much    Drug use: No   Sexual activity: Yes    Birth control/protection: Condom  Other Topics Concern   Not on file  Social History Narrative   Lives at home with her parents, brother and her children   Right handed   Drinks rare caffeine    Social Determinants of Health   Financial Resource Strain: Low Risk  (11/09/2017)   Overall Financial Resource Strain (CARDIA)    Difficulty of Paying Living Expenses: Not hard at all  Food Insecurity: No Food Insecurity (11/09/2017)   Hunger Vital Sign    Worried About Running Out of Food in the Last Year: Never true    [place] in the Last Year: Never true  Transportation Needs: No Transportation Needs (01/06/2018)   PRAPARE - Hydrologist (Medical): No    Lack of Transportation (Non-Medical): No  Recent Concern: Transportation Needs - Unmet Transportation Needs (11/09/2017)   PRAPARE - Transportation    Lack of Transportation (Medical): Yes    Lack of Transportation (Non-Medical): Yes  Physical Activity: Sufficiently Active (11/09/2017)   Exercise Vital Sign    Days of Exercise per Week: 3 days    Minutes of Exercise per Session: 50 min  Stress: No Stress Concern Present (11/09/2017)   [place]    Feeling of Stress : Not at all  Social Connections: Unknown (11/09/2017)   Social Connection and Isolation Panel [NHANES]    Frequency of Communication with Friends and Family: More than three times a week    Frequency of Social Gatherings with Friends and Family: Once a week    Attends Religious Services: Never    Marine scientist or Organizations: No    Attends Music therapist: Never    Marital Status: Not on file  Intimate Partner Violence: Not on file    Vital Signs: Blood pressure 130/82, pulse 95, temperature 98.3 F (36.8 C), resp. rate 16, height 5' (1.524 m), weight 231 lb (104.8 kg), SpO2 99  %.  Examination: General Appearance: The patient is well-developed, well-nourished, and in no distress. Skin: Gross inspection of skin unremarkable. Head: normocephalic, no gross deformities. Eyes: no gross deformities noted. ENT: ears appear grossly normal no exudates. Neck: Supple. No thyromegaly. No LAD. Respiratory: no rhonchi noted. Cardiovascular: Normal S1 and S2 without murmur or rub. Extremities: No cyanosis. pulses are equal. Neurologic: Alert and oriented. No involuntary movements.  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 HbH disease is characterized by chronic hemolytic anemia, hepatosplenomegaly, mild jaundice, and about one third develop skeletal changes due to expanded erythropoiesis in the marrow. Additionally, this individual is negative for the Hemoglobin Constant Spring mutation. The presence of other non-deletional mutations, like point mutations, in the alpha-globin gene cannot be excluded as the assay does not detect these changes.  Co-inheritance of alpha-thalassemia and other hemoglobinopathies, such as beta-thalassemia and sickle cell disease is possible.  Genetic counseling and hemoglobinopathy testing are recommended for this individual's reproductive partner and family members. Alpha-thalassemia is an autosomal reces sive condition that results from a deletion or dysfunction of alpha-globin chains. There are four genes that make the alpha-globin chain, which binds with the beta-globin chain to create the alpha alpha/beta beta hemoglobin  tetramer. An imbalance of alpha and beta chains can lead to disease, ranging from mild anemia to fatal hydrops fetalis. Hemoglobin Constant Spring is a non-deletional alpha-thalassemia mutation that is more common in [place]. Hemoglobin Constant Spring is caused by a mutation in the stop codon of the alpha(2)-globin gene that results in poor alpha-globin production. The assay detects over 90% of alpha-globin deletions depending on an individual's ethnic background. The analysis does not detect mutations in the beta-globin gene, such as beta-thalassemia or sickle cell disease. Diagnosis of alpha-thalassemia should not rely on DNA testing alone, but should take into consideration clinical symptoms and other test results, such as hemoglobin fra ctionation testing. Genetic counselors are available for health care providers to discuss this result further at [phone]. Methodology: DNA analysis of the alpha-globin locus ([place], OMIM 141800 / 334356, 16pter-16p13.3) is performed by multiplex ligation-dependent probe amplification (MLPA). This methodology detects copy number changes by targeting 24 different sequences in the alpha-globin region, and will, in principle, detect all genomic deletions and duplications involving this locus, as well as the Constant Spring point mutation. DNA analysis of the alpha-globin gene deletions performed by multiplex polymerase chain reaction (PCR) followed by agarose gel electrophoresis may on occasion be used for confirmatory purpose. The mutations analyzed in this manner are alpha3.7, alpha4.2, Southeast Asian type (--SEA), Filipino type (--FIL), [place] type(--THAI), Mediterranean (--MED) and alpha (20.5). The analysis does not detect other point mutations within t his region, nor does it detect mutations of the beta-globin gene, that underlie beta-thalassemia or sickle cell disease. Results of this test are for investigational and  research purposes only per the assay's manufacturer. The performance characteristics of this assay have not been established by the manufacturer. The result should not be used for treatment or for diagnostic purposes without confirmation of the diagnosis by another medically established diagnostic product or procedure. The performance characteristics were determined by [facility]. The presence of a rare mutation cannot be entirely ruled out. Molecular-based testing is highly accurate, but as in any laboratory test, rare diagnostic error may occur. References: 1. Chong SS et al. Myrle Sheng multiplex-PCR screen for   common deletional determinants of alpha-thalassemia.   2000.  Blood 95:360-362. 2. Lance Muss et al. Simplified Multiplex-PCR diagnosis of   common Southeast Asian deletional determinants of a lpha-   thalassemia. 2000. Clin Chem 46(10):1692-5. 3. Schrier SL et al. The unusal pathobiology of Hemoglobin   Constant Spring red blood cells.   1977. Blood 89:1762-1769. 4. Singer ST et al. Hemoglobin H-Constant Spring in [place]: an alpha thalassemia with frequent complications   . 2009. Am J Hematology  87:564-332. 5. Uaprasert N et al. Hematological characteristics and   effective screening for compound heterozygosity for Hb   Constant Spring and deletional alpha-+-thalassemia.   2011. Am J Hematology [phone]. [place] Alpha-Thalassemia. 2011.   GeneReviews. www.genetests.org 7. [place]   http://hill.biz/ as cited on   10/19/2003. 8. Barbee Cough, Han H, et al. Detection of alpha-thalassemia in   [place] by using multiplex ligation-dependent probe   amplification. 2008. Hemoglobin 32:561-571. 9. [facility] SALSA MLPA P140-B2 HBA kit RUO package insert   version 16 . Results Released By: Gracy Bruins. Martie Round, Ph.D., Director [address], [place], [place] [address] Report Released By:  Gwinda Passe. [place], MS, CGC, Genetic Counselor Results for this test are for research purposes only by the assay's manufacturer.  The performance characteristics of this product have not been established.  Results should not be used as a diagnostic procedure without confirmation of the diagnosis by another medically established diagnostic product or procedure. Performed At: [facility] [address] [place], [place] [id number] Katina Degree MDPhD FT:[id number]   Hgb Fractionation [place]     Status: Abnormal   Collection Time: 05/13/22 12:13 PM  Result Value Ref Range   Hgb F Reflexed 0.0 - 2.0 %   Hgb A Reflexed 96.4 - 98.8 %   Hgb A2 3.7 (H) 1.8 - 3.2 %   Hgb S Reflexed 0.0 %   Interpretation, Hgb Fract Comment     Comment: (NOTE) Capillary Electrophoresis (CE) performed. Reflex to High-pressure liquid chromatography (HPLC) method for confirmation. Performed At: [facility] [place], [place] [id number] Rush Farmer MD TD:[id number]   CBC with Differential/Platelet     Status: Abnormal   Collection Time: 05/13/22 12:13 PM  Result Value Ref Range   WBC 7.5 4.0 - 10.5 K/uL   RBC 6.52 (H) 3.87 - 5.11 MIL/uL   Hemoglobin 10.7 (L) 12.0 - 15.0 g/dL    Comment: Reticulocyte Hemoglobin testing may be clinically indicated, consider ordering this additional test [id number]    HCT 34.9 (L) 36.0 - 46.0 %   MCV 53.5 (L) 80.0 - 100.0 fL   MCH 16.4 (L) 26.0 - 34.0 pg   MCHC 30.7 30.0 - 36.0 g/dL   RDW 22.1 (H) 11.5 - 15.5 %   Platelets 346 150 - 400 K/uL    Comment: SPECIMEN CHECKED FOR CLOTS   nRBC 0.0 0.0 - 0.2 %   Neutrophils Relative % 57 %   Lymphocytes Relative 32 %   Monocytes Relative 7 %   Eosinophils Relative 3 %   Basophils Relative 1 %   Band Neutrophils 0 %   Metamyelocytes Relative 0 %   Myelocytes 0 %   Promyelocytes Relative 0 %   Blasts 0 %   nRBC 0 0 /100 WBC   Other 0 %   Neutro Abs 4.3 1.7 - 7.7 K/uL   Lymphs Abs 2.4 0.7 - 4.0 K/uL    Monocytes Absolute 0.5 0.1 - 1.0 K/uL   Eosinophils Absolute 0.2 0.0 - 0.5 K/uL   Basophils Absolute 0.1 0.0 - 0.1 K/uL   Abs Immature Granulocytes 0.00 0.00 - 0.07 K/uL   RBC Morphology ELLIPTOCYTES     Comment: MICRO/HYPOCHROMIC CELLS NOTED ON SMEAR    WBC Morphology DIFF. CONFIRMED BY SMEAR    Smear Review PLATELETS APPEAR ADEQUATE     Comment: Normal platelet morphology Performed at [facility], [address] [place], [place] [address]   Hgb Fractionation by HPLC     Status: Abnormal   Collection Time: 05/13/22 12:13 PM  Result Value Ref Range   Hgb F 0.0 0.0 - 2.0 %   Hgb A 79.6 (L) 96.4 - 98.8 %   Hgb A2 Comment 1.8 - 3.2 %    Comment: See Capillary Electrophoresis (CE) result.   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 Hgb E is observed at a concentration less than the interpretation range which suggests: transfusion of a homozygous Hgb E patient, Hgb E trait with alpha-thalassemia, or Hgb E trait with iron deficiency anemia. Suggest clinical and hematologic correlation.          Hgb E-Trait Interpretation Ranges         Hgb A        >70.0%         Hgb E        <20.0%         Hgb A2       < 5.0% Performed At: [facility] [place], [place] [id number] Rush Farmer MD YW:[id number]     Radiology: No results found.  No results found.  No results found.    Assessment and Plan: Patient Active Problem List   Diagnosis Date Noted   Hyperlipidemia 06/08/2022   Anemia 05/13/2022   OSA (obstructive sleep apnea) 03/15/2021   Other hypersomnia 03/15/2021   Acute pain of left knee 08/12/2020   Baker cyst, left 08/12/2020   Flu vaccine need 08/12/2020   History of stroke 07/21/2018   Cerebrovascular accident (CVA) ([place]) 12/02/2017   Headache disorder  12/02/2017   Left sided numbness 11/09/2017   Subluxation of patellofemoral joint 10/05/2015    1. SOB (shortness of breath) on exertion Spirometry will be done we will get results and discussed with the patient.  I think a lot of her shortness of breath this is coming from severe deconditioning and obstructive lung disease as a contributing factor for the shortness of breath. - Spirometry with graph  2. Obesity, morbid ([place]) Obesity Counseling: Had a lengthy discussion regarding patients BMI and weight issues. Patient was instructed on portion control as well as increased activity. Also discussed caloric restrictions with trying to maintain intake less than 2000 Kcal. Discussions were made in accordance with the 5As of weight management. Simple actions such as not eating late and if able to, taking a walk is suggested.   3. Fatty liver Needs follow-up with her primary provider and follow-up labs.  General Counseling: I have discussed the findings of the evaluation and examination with Ameilia.  I have also discussed any further diagnostic evaluation thatmay be needed or ordered today. Marijo verbalizes understanding of the findings of todays visit. We also reviewed her medications today and discussed drug interactions and side effects including but not limited excessive drowsiness and altered mental states. We also discussed that there is always a risk not just to her but also people around her. she has been encouraged to call the office with any questions or concerns that should arise related to todays visit.  Orders Placed This Encounter  Procedures   Spirometry with graph    Order Specific Question:   Where should  this test be performed?    Answer:   [facility]     Time spent: 6  I have personally obtained a history, examined the patient, evaluated laboratory and imaging results, formulated the assessment and plan and placed orders.    Allyne Gee, MD  [facility] Pulmonary and Critical Care Sleep medicine

## 2022-07-09 NOTE — Telephone Encounter (Signed)
Pt advise that chol med discuss at next visit

## 2022-07-09 NOTE — Telephone Encounter (Signed)
Need to wait until next follow up

## 2022-07-16 ENCOUNTER — Telehealth: Payer: Self-pay | Admitting: Internal Medicine

## 2022-07-16 ENCOUNTER — Encounter: Payer: BC Managed Care – PPO | Admitting: Nurse Practitioner

## 2022-07-16 NOTE — Telephone Encounter (Signed)
Left vm and sent mychart message to confirm 07/23/22 appointment-Toni 

## 2022-07-21 DIAGNOSIS — G4733 Obstructive sleep apnea (adult) (pediatric): Secondary | ICD-10-CM | POA: Diagnosis not present

## 2022-07-22 ENCOUNTER — Ambulatory Visit
Admission: RE | Admit: 2022-07-22 | Discharge: 2022-07-22 | Disposition: A | Discharge status: Home / Self Care | Payer: BC Managed Care – PPO | Source: Ambulatory Visit | Attending: Oncology | Admitting: Oncology

## 2022-07-22 DIAGNOSIS — D560 Alpha thalassemia: Secondary | ICD-10-CM | POA: Insufficient documentation

## 2022-07-22 DIAGNOSIS — Z780 Asymptomatic menopausal state: Secondary | ICD-10-CM | POA: Diagnosis not present

## 2022-07-23 ENCOUNTER — Ambulatory Visit (INDEPENDENT_AMBULATORY_CARE_PROVIDER_SITE_OTHER): Payer: BC Managed Care – PPO | Admitting: Internal Medicine

## 2022-07-23 DIAGNOSIS — R0602 Shortness of breath: Secondary | ICD-10-CM | POA: Diagnosis not present

## 2022-07-25 NOTE — Procedures (Signed)
[facility] [place], [id number]    Complete Pulmonary Function Testing Interpretation:  FINDINGS:  The forced vital capacity is normal FEV1 is normal FEV1 FVC ratio is mildly decreased.  Postbronchodilator no significant change in the FEV1 is noted.  Total lung capacity is increased residual volume is increased FRC is increased.  DLCO is normal.  IMPRESSION:  This pulmonary function study is within normal limits  Allyne Gee, MD [facility] Pulmonary Critical Care Medicine Sleep Medicine

## 2022-07-29 ENCOUNTER — Telehealth: Payer: Self-pay

## 2022-07-29 ENCOUNTER — Ambulatory Visit (INDEPENDENT_AMBULATORY_CARE_PROVIDER_SITE_OTHER): Payer: BC Managed Care – PPO | Admitting: Nurse Practitioner

## 2022-07-29 ENCOUNTER — Encounter: Payer: Self-pay | Admitting: Nurse Practitioner

## 2022-07-29 VITALS — BP 111/85 | HR 98 | Temp 98.4°F | Resp 16 | Ht 60.0 in | Wt 229.6 lb

## 2022-07-29 DIAGNOSIS — Z1231 Encounter for screening mammogram for malignant neoplasm of breast: Secondary | ICD-10-CM | POA: Diagnosis not present

## 2022-07-29 DIAGNOSIS — E782 Mixed hyperlipidemia: Secondary | ICD-10-CM

## 2022-07-29 DIAGNOSIS — Z0001 Encounter for general adult medical examination with abnormal findings: Secondary | ICD-10-CM

## 2022-07-29 DIAGNOSIS — I10 Essential (primary) hypertension: Secondary | ICD-10-CM | POA: Insufficient documentation

## 2022-07-29 DIAGNOSIS — K760 Fatty (change of) liver, not elsewhere classified: Secondary | ICD-10-CM | POA: Diagnosis not present

## 2022-07-29 DIAGNOSIS — Z6841 Body Mass Index (BMI) 40.0 and over, adult: Secondary | ICD-10-CM

## 2022-07-29 DIAGNOSIS — J4520 Mild intermittent asthma, uncomplicated: Secondary | ICD-10-CM

## 2022-07-29 LAB — PULMONARY FUNCTION TEST

## 2022-07-29 MED ORDER — SEMAGLUTIDE-WEIGHT MANAGEMENT 2.4 MG/0.75ML ~~LOC~~ SOAJ: 2.4000 mg | SUBCUTANEOUS | 3 refills | Status: DC

## 2022-07-29 NOTE — Telephone Encounter (Signed)
Sent P.A. for patient's Repatha. 

## 2022-07-29 NOTE — Progress Notes (Signed)
[facility] [place], [place] [address]  Internal MEDICINE  Office Visit Note  Patient Name: Natasha Moreno  [id number]  [id number]  Date of Service: 07/29/2022  Chief Complaint  Patient presents with   Annual Exam   Hyperlipidemia   Hypertension    HPI Kalen presents for an annual well visit and physical exam.  Well-appearing 41 year old female with OSA, hypertension, fatty liver, hepatomegaly and history of stroke. Due for routine mammogram this year  --on wegovy, wants to increase the dose, currently on 1.7 mg dose.  --due for repeat lipid panel.  -- pap smear due in 2025.  No new or worsening pain  Other concerns: none     07/29/2022   10:15 AM  Depression screen PHQ 2/9  Decreased Interest 0  Down, Depressed, Hopeless 0  PHQ - 2 Score 0         Current Medication: Outpatient Encounter Medications as of 07/29/2022  Medication Sig   albuterol (PROAIR HFA) 108 (90 Base) MCG/ACT inhaler TAKE 2 PUFFS BY MOUTH EVERY 6 HOURS AS NEEDED FOR WHEEZE OR SHORTNESS OF BREATH   cetirizine (ZYRTEC) 10 MG tablet Take 10 mg by mouth daily as needed for allergies.    Evolocumab (REPATHA SURECLICK) 614 MG/ML SOAJ Inject 140 mg into the skin every 14 (fourteen) days.   famotidine (PEPCID) 20 MG tablet Take 1 tablet (20 mg total) by mouth 2 (two) times daily.   Semaglutide-Weight Management 2.4 MG/0.75ML SOAJ Inject 2.4 mg into the skin once a week.   traZODone (DESYREL) 50 MG tablet Take 50-100 mg by mouth at bedtime as needed.   Vitamin D, Ergocalciferol, (DRISDOL) 1.25 MG (50000 UNIT) CAPS capsule Take 1 capsule (50,000 Units total) by mouth every 7 (seven) days.   [DISCONTINUED] folic acid (FOLVITE) 1 MG tablet Take 1 tablet (1 mg total) by mouth daily.   [DISCONTINUED] hydrochlorothiazide (HYDRODIURIL) 12.5 MG tablet Take 1 tablet (12.5 mg total) by mouth daily.   [DISCONTINUED] Semaglutide-Weight Management 1.7 MG/0.75ML SOAJ Inject 1.7 mg into the  skin once a week.   No facility-administered encounter medications on file as of 07/29/2022.    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[place])    Hyperlipidemia    Hypertension    Migraine    Morbid obesity ([place])    Sleep apnea    uses CPAP   Stroke [facility])     Family History: Family History  Problem Relation Age of Onset   Kidney failure Father    Heart attack Father        open heart surgery   Hypertension Father    Diabetes Father    Gout Father    Cataracts Father     Social History   Socioeconomic History   Marital status: Single    Spouse name: Not on file   Number of children: 2   Years of education: Not on file  Highest education level: High school graduate  Occupational History   Not on file  Tobacco Use   Smoking status: Former    Packs/day: 0.25    Types: Cigarettes    Quit date: 11/09/2017    Years since quitting: 4.8   Smokeless tobacco: Never  Vaping Use   Vaping Use: Former  Substance and Sexual Activity   Alcohol use: Yes    Comment: every other weekend, not much    Drug use: No   Sexual activity: Yes    Birth control/protection: Condom  Other Topics Concern   Not on file  Social History Narrative   Lives at home with her parents, brother and her children   Right handed   Drinks rare caffeine    Social Determinants of Health   Financial Resource Strain: Low Risk  (11/09/2017)   Overall  Financial Resource Strain (CARDIA)    Difficulty of Paying Living Expenses: Not hard at all  Food Insecurity: No Food Insecurity (11/09/2017)   Hunger Vital Sign    Worried About Running Out of Food in the Last Year: Never true    [place] in the Last Year: Never true  Transportation Needs: No Transportation Needs (01/06/2018)   PRAPARE - Hydrologist (Medical): No    Lack of Transportation (Non-Medical): No  Recent Concern: Transportation Needs - Unmet Transportation Needs (11/09/2017)   PRAPARE - Transportation    Lack of Transportation (Medical): Yes    Lack of Transportation (Non-Medical): Yes  Physical Activity: Sufficiently Active (11/09/2017)   Exercise Vital Sign    Days of Exercise per Week: 3 days    Minutes of Exercise per Session: 50 min  Stress: No Stress Concern Present (11/09/2017)   [place]    Feeling of Stress : Not at all  Social Connections: Unknown (11/09/2017)   Social Connection and Isolation Panel [NHANES]    Frequency of Communication with Friends and Family: More than three times a week    Frequency of Social Gatherings with Friends and Family: Once a week    Attends Religious Services: Never    Marine scientist or Organizations: No    Attends Music therapist: Never    Marital Status: Not on file  Intimate Partner Violence: Not on file      Review of Systems  Constitutional:  Negative for activity change, appetite change, chills, fatigue, fever and unexpected weight change.  HENT: Negative.  Negative for congestion, ear pain, rhinorrhea, sore throat and trouble swallowing.   Eyes: Negative.   Respiratory: Negative.  Negative for cough, chest tightness, shortness of breath and wheezing.   Cardiovascular: Negative.  Negative for chest pain.  Gastrointestinal: Negative.  Negative for abdominal pain, blood in stool, constipation, diarrhea,  nausea and vomiting.  Endocrine: Negative.   Genitourinary: Negative.  Negative for difficulty urinating, dysuria, frequency, hematuria and urgency.  Musculoskeletal: Negative.  Negative for arthralgias, back pain, joint swelling, myalgias and neck pain.  Skin: Negative.  Negative for rash and wound.  Allergic/Immunologic: Negative.  Negative for immunocompromised state.  Neurological: Negative.  Negative for dizziness, seizures, numbness and headaches.  Hematological: Negative.   Psychiatric/Behavioral: Negative.  Negative for behavioral problems, self-injury and suicidal ideas. The patient is not nervous/anxious.     Vital Signs: BP 111/85   Pulse 98   Temp 98.4 F (36.9 C)   Resp 16   Ht 5' (1.524 m)  Wt 229 lb 9.6 oz (104.1 kg)   BMI 44.84 kg/m    Physical Exam Vitals reviewed.  Constitutional:      General: She is awake. She is not in acute distress.    Appearance: Normal appearance. She is well-developed and well-groomed. She is obese. She is not ill-appearing or diaphoretic.  HENT:     Head: Normocephalic and atraumatic.     Right Ear: Tympanic membrane, ear canal and external ear normal.     Left Ear: Tympanic membrane, ear canal and external ear normal.     Nose: Nose normal. No congestion or rhinorrhea.     Mouth/Throat:     Lips: Pink.     Mouth: Mucous membranes are moist.     Pharynx: Oropharynx is clear. Uvula midline. No oropharyngeal exudate or posterior oropharyngeal erythema.  Eyes:     General: Lids are normal. Vision grossly intact. Gaze aligned appropriately. No scleral icterus.       Right eye: No discharge.        Left eye: No discharge.     Extraocular Movements: Extraocular movements intact.     Conjunctiva/sclera: Conjunctivae normal.     Pupils: Pupils are equal, round, and reactive to light.     Funduscopic exam:    Right eye: Red reflex present.        Left eye: Red reflex present. Neck:     Thyroid: No thyromegaly.     Vascular: No  carotid bruit or JVD.     Trachea: Trachea and phonation normal. No tracheal deviation.  Cardiovascular:     Rate and Rhythm: Normal rate and regular rhythm.     Pulses: Normal pulses.     Heart sounds: Normal heart sounds, S1 normal and S2 normal. No murmur heard.    No friction rub. No gallop.  Pulmonary:     Effort: Pulmonary effort is normal. No accessory muscle usage or respiratory distress.     Breath sounds: Normal breath sounds and air entry. No stridor. No wheezing or rales.  Chest:     Chest wall: No tenderness.     Comments: Declined clinical breast exam, will get first annual screening mammogram next year.  Abdominal:     General: Bowel sounds are normal. There is no distension.     Palpations: Abdomen is soft. There is no shifting dullness, fluid wave, mass or pulsatile mass.     Tenderness: There is no abdominal tenderness. There is no guarding or rebound.     Hernia: There is no hernia in the left inguinal area or right inguinal area.  Genitourinary:    General: Normal vulva.     Exam position: Lithotomy position.     Labia:        Right: No rash, tenderness, lesion or injury.        Left: No rash, tenderness, lesion or injury.      Urethra: No prolapse, urethral swelling or urethral lesion.     Vagina: Normal. No signs of injury and foreign body. No vaginal discharge, erythema, tenderness, bleeding, lesions or prolapsed vaginal walls.     Cervix: No cervical motion tenderness, discharge, friability, lesion, erythema, cervical bleeding or eversion.     Uterus: Normal. Not deviated, not fixed and no uterine prolapse.      Adnexa: Right adnexa normal and left adnexa normal.     Rectum: No tenderness, anal fissure or external hemorrhoid. Normal anal tone.  Musculoskeletal:        General:  No tenderness or deformity. Normal range of motion.     Cervical back: Normal range of motion and neck supple.     Right lower leg: No edema.     Left lower leg: No edema.   Lymphadenopathy:     Cervical: No cervical adenopathy.     Lower Body: No right inguinal adenopathy. No left inguinal adenopathy.  Skin:    General: Skin is warm and dry.     Capillary Refill: Capillary refill takes less than 2 seconds.     Coloration: Skin is not pale.     Findings: No erythema or rash.  Neurological:     Mental Status: She is alert and oriented to person, place, and time.     Cranial Nerves: No cranial nerve deficit.     Motor: No abnormal muscle tone.     Coordination: Coordination normal.     Gait: Gait normal.     Deep Tendon Reflexes: Reflexes are normal and symmetric.  Psychiatric:        Mood and Affect: Mood and affect normal.        Behavior: Behavior normal. Behavior is cooperative.        Thought Content: Thought content normal.        Judgment: Judgment normal.        Assessment/Plan: 1. Encounter for routine adult health examination with abnormal findings Age-appropriate preventive screenings and vaccinations discussed, annual physical exam completed. Routine labs for health maintenance ordered, see below. PHM updated.   2. Fatty liver Patient is already on semaglutide which is recommended in the setting of liver disease.  3. Mixed hyperlipidemia Repeat cholesterol panel - Lipid Profile  4. Class 3 severe obesity due to excess calories with serious comorbidity and body mass index (BMI) of 40.0 to 44.9 in adult [facility]) Wegovy dose increased. Follow up in 3 months - Semaglutide-Weight Management 2.4 MG/0.75ML SOAJ; Inject 2.4 mg into the skin once a week.  Dispense: 3 mL; Refill: 3  5. Encounter for screening mammogram for malignant neoplasm of breast Routine mammogram ordered - MM 3D SCREEN BREAST BILATERAL; Future     General Counseling: mahek laspisa understanding of the findings of todays visit and agrees with plan of treatment. I have discussed any further diagnostic evaluation that may be needed or ordered today. We also reviewed  her medications today. she has been encouraged to call the office with any questions or concerns that should arise related to todays visit.    Orders Placed This Encounter  Procedures   MM 3D SCREEN BREAST BILATERAL   Lipid Profile    Meds ordered this encounter  Medications   Semaglutide-Weight Management 2.4 MG/0.75ML SOAJ    Sig: Inject 2.4 mg into the skin once a week.    Dispense:  3 mL    Refill:  3    Note increased dose, please fill this script next. Discontinue 1.7 mg dose, thanks.    No follow-ups on file.   Total time spent:30 Minutes Time spent includes review of chart, medications, test results, and follow up plan with the patient.   [place] Controlled Substance Database was reviewed by me.  This patient was seen by Jonetta Osgood, FNP-C in collaboration with Dr. Clayborn Bigness as a part of collaborative care agreement.  Doss Cybulski R. Valetta Fuller, MSN, FNP-C Internal medicine

## 2022-07-30 ENCOUNTER — Telehealth: Payer: Self-pay

## 2022-08-05 ENCOUNTER — Ambulatory Visit: Payer: BC Managed Care – PPO | Admitting: Internal Medicine

## 2022-08-05 ENCOUNTER — Encounter: Payer: Self-pay | Admitting: Internal Medicine

## 2022-08-05 DIAGNOSIS — J4520 Mild intermittent asthma, uncomplicated: Secondary | ICD-10-CM | POA: Diagnosis not present

## 2022-08-05 DIAGNOSIS — Z7189 Other specified counseling: Secondary | ICD-10-CM | POA: Diagnosis not present

## 2022-08-05 DIAGNOSIS — G4733 Obstructive sleep apnea (adult) (pediatric): Secondary | ICD-10-CM

## 2022-08-05 NOTE — Progress Notes (Signed)
[facility] [place], [place] [address]  Pulmonary Sleep Medicine   Office Visit Note  Patient Name: Natasha Moreno DOB: 12/01/1980 MRN [id number]  Date of Service: 08/05/2022  Complaints/HPI: She had PFT done this was basically normal. She is doing better with her breathing. She uses her albuterol on occasion for rescue. No cough or chest pain. No wheezing. NO fevers or chills  ROS  General: (-) fever, (-) chills, (-) night sweats, (-) weakness Skin: (-) rashes, (-) itching,. Eyes: (-) visual changes, (-) redness, (-) itching. Nose and Sinuses: (-) nasal stuffiness or itchiness, (-) postnasal drip, (-) nosebleeds, (-) sinus trouble. Mouth and Throat: (-) sore throat, (-) hoarseness. Neck: (-) swollen glands, (-) enlarged thyroid, (-) neck pain. Respiratory: - cough, (-) bloody sputum, - shortness of breath, - wheezing. Cardiovascular: - ankle swelling, (-) chest pain. Lymphatic: (-) lymph node enlargement. Neurologic: (-) numbness, (-) tingling. Psychiatric: (-) anxiety, (-) depression   Current Medication: Outpatient Encounter Medications as of 08/05/2022  Medication Sig   albuterol (PROAIR HFA) 108 (90 Base) MCG/ACT inhaler TAKE 2 PUFFS BY MOUTH EVERY 6 HOURS AS NEEDED FOR WHEEZE OR SHORTNESS OF BREATH   cetirizine (ZYRTEC) 10 MG tablet Take 10 mg by mouth daily as needed for allergies.    Evolocumab (REPATHA SURECLICK) 119 MG/ML SOAJ Inject 140 mg into the skin every 14 (fourteen) days.   famotidine (PEPCID) 20 MG tablet Take 1 tablet (20 mg total) by mouth 2 (two) times daily.   folic acid (FOLVITE) 1 MG tablet Take 1 tablet (1 mg total) by mouth daily.   hydrochlorothiazide (HYDRODIURIL) 12.5 MG tablet Take 1 tablet (12.5 mg total) by mouth daily.   Semaglutide-Weight Management 2.4 MG/0.75ML SOAJ Inject 2.4 mg into the skin once a week.   traZODone (DESYREL) 50 MG tablet Take 50-100 mg by mouth at bedtime as needed.   Vitamin D,  Ergocalciferol, (DRISDOL) 1.25 MG (50000 UNIT) CAPS capsule Take 1 capsule (50,000 Units total) by mouth every 7 (seven) days.   No facility-administered encounter medications on file as of 08/05/2022.    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[place])    Hyperlipidemia    Hypertension    Migraine    Morbid obesity ([place])    Sleep apnea    uses CPAP   Stroke [facility])     Family History: Family History  Problem Relation Age of Onset   Kidney failure Father    Heart attack Father        open heart surgery   Hypertension Father    Diabetes Father    Gout Father    Cataracts Father     Social History: Social History   Socioeconomic History   Marital status: Single    Spouse name: Not on file   Number of children: 2   Years of education: Not on file   Highest education level: High school graduate  Occupational  History   Not on file  Tobacco Use   Smoking status: Former    Packs/day: 0.25    Types: Cigarettes    Quit date: 11/09/2017    Years since quitting: 4.7   Smokeless tobacco: Never  Vaping Use   Vaping Use: Former  Substance and Sexual Activity   Alcohol use: Yes    Comment: every other weekend, not much    Drug use: No   Sexual activity: Yes    Birth control/protection: Condom  Other Topics Concern   Not on file  Social History Narrative   Lives at home with her parents, brother and her children   Right handed    Drinks rare caffeine    Social Determinants of Health   Financial Resource Strain: Low Risk  (11/09/2017)   Overall Financial Resource Strain (CARDIA)    Difficulty of Paying Living Expenses: Not hard at all  Food Insecurity: No Food Insecurity (11/09/2017)   Hunger Vital Sign    Worried About Running Out of Food in the Last Year: Never true    [place] in the Last Year: Never true  Transportation Needs: No Transportation Needs (01/06/2018)   PRAPARE - Hydrologist (Medical): No    Lack of Transportation (Non-Medical): No  Recent Concern: Transportation Needs - Unmet Transportation Needs (11/09/2017)   PRAPARE - Transportation    Lack of Transportation (Medical): Yes    Lack of Transportation (Non-Medical): Yes  Physical Activity: Sufficiently Active (11/09/2017)   Exercise Vital Sign    Days of Exercise per Week: 3 days    Minutes of Exercise per Session: 50 min  Stress: No Stress Concern Present (11/09/2017)   [place]    Feeling of Stress : Not at all  Social Connections: Unknown (11/09/2017)   Social Connection and Isolation Panel [NHANES]    Frequency of Communication with Friends and Family: More than three times a week    Frequency of Social Gatherings with Friends and Family: Once a week    Attends Religious Services: Never    Marine scientist or Organizations: No    Attends Music therapist: Never    Marital Status: Not on file  Intimate Partner Violence: Not on file    Vital Signs: Blood pressure 120/80, pulse 97, temperature 98.6 F (37 C), resp. rate 16, height _0  (1.499 m), weight 238 lb (108 kg), SpO2 99 %.  Examination: General Appearance: The patient is well-developed, well-nourished, and in no distress. Skin: Gross inspection of skin unremarkable. Head: normocephalic, no gross deformities. Eyes: no gross deformities noted. ENT: ears  appear grossly normal no exudates. Neck: Supple. No thyromegaly. No LAD. Respiratory: no rhonchi noted. Cardiovascular: Normal S1 and S2 without murmur or rub. Extremities: No cyanosis. pulses are equal. Neurologic: Alert and oriented. No involuntary movements.  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 HbH disease is characterized by chronic hemolytic anemia, hepatosplenomegaly, mild jaundice, and about one third develop skeletal changes due to expanded erythropoiesis in the marrow. Additionally,  this individual is negative for the Hemoglobin Constant Spring mutation. The presence of other non-deletional mutations, like point mutations, in the alpha-globin gene cannot be excluded as the assay does not detect these changes.  Co-inheritance of alpha-thalassemia and other hemoglobinopathies, such as beta-thalassemia and sickle cell disease is possible.  Genetic counseling and hemoglobinopathy testing are recommended for this individual's reproductive partner and family members. Alpha-thalassemia is an autosomal reces sive condition that results from a deletion or dysfunction of alpha-globin chains. There are four genes that make the alpha-globin chain, which binds with the beta-globin chain to create the alpha alpha/beta beta hemoglobin tetramer. An imbalance of alpha and beta chains can lead to disease, ranging from mild anemia to fatal hydrops fetalis. Hemoglobin Constant Spring is a non-deletional alpha-thalassemia mutation that is more common in [place].  Hemoglobin Constant Spring is caused by a mutation in the stop codon of the alpha(2)-globin gene that results in poor alpha-globin production. The assay detects over 90% of alpha-globin deletions depending on an individual's ethnic background. The analysis does not detect mutations in the beta-globin gene, such as beta-thalassemia or sickle cell disease. Diagnosis of alpha-thalassemia should not rely on DNA testing alone, but should take into consideration clinical symptoms and other test results, such as hemoglobin fra ctionation testing. Genetic counselors are available for health care providers to discuss this result further at [phone]. Methodology: DNA analysis of the alpha-globin locus ([place], OMIM 141800 / 314970, 16pter-16p13.3) is performed by multiplex ligation-dependent probe amplification (MLPA). This methodology detects copy number changes by targeting 24 different sequences in the alpha-globin region, and will, in principle, detect all genomic deletions and duplications involving this locus, as well as the Constant Spring point mutation. DNA analysis of the alpha-globin gene deletions performed by multiplex polymerase chain reaction (PCR) followed by agarose gel electrophoresis may on occasion be used for confirmatory purpose. The mutations analyzed in this manner are alpha3.7, alpha4.2, Southeast Asian type (--SEA), Filipino type (--FIL), [place] type(--THAI), Mediterranean (--MED) and alpha (20.5). The analysis does not detect other point mutations within t his region, nor does it detect mutations of the beta-globin gene, that underlie beta-thalassemia or sickle cell disease. Results of this test are for investigational and research purposes only per the assay's manufacturer. The performance characteristics of this assay have not been established by the manufacturer. The result should not be used for treatment or for diagnostic purposes without  confirmation of the diagnosis by another medically established diagnostic product or procedure. The performance characteristics were determined by [facility]. The presence of a rare mutation cannot be entirely ruled out. Molecular-based testing is highly accurate, but as in any laboratory test, rare diagnostic error may occur. References: 1. Chong SS et al. Myrle Sheng multiplex-PCR screen for   common deletional determinants of alpha-thalassemia.   2000.  Blood 95:360-362. 2. Lance Muss et al. Simplified Multiplex-PCR diagnosis of   common Southeast Asian deletional determinants of a lpha-   thalassemia. 2000. Clin Chem 46(10):1692-5. 3. Schrier SL et al. The unusal pathobiology of Hemoglobin   Constant Spring red blood cells.   1977. Blood 89:1762-1769. 4. Singer ST et al. Hemoglobin H-Constant Spring in [place]: an alpha thalassemia with frequent complications   . 2009. Am J Hematology [phone]. 5. Uaprasert N et al. Hematological characteristics and   effective screening for compound heterozygosity for Hb   Constant Spring and deletional alpha-+-thalassemia.   2011. Am J Hematology [phone]. [place] Alpha-Thalassemia. 2011.   GeneReviews. www.genetests.org  [place]   http://hill.biz/ as cited on   10/19/2003. 8. Barbee Cough, Han H, et al. Detection of alpha-thalassemia in   [place] by using multiplex ligation-dependent probe   amplification. 2008. Hemoglobin 32:561-571. 9. [facility] SALSA MLPA P140-B2 HBA kit RUO package insert   version 16 . Results Released By: Gracy Bruins. Martie Round, Ph.D., Director [address] [place], [place] [address] Report Released By: Gwinda Passe. Dexter, MS, CGC, Genetic Counselor Results for this test are for research purposes only by the assay's manufacturer.  The performance characteristics of this product have not been established.  Results should not be  used as a diagnostic procedure without confirmation of the diagnosis by another medically established diagnostic product or procedure. Performed At: [facility] [address] [place], [place] [id number] Katina Degree MDPhD [id number]   Hgb Fractionation Cascade     Status: Abnormal   Collection Time: 05/13/22 12:13 PM  Result Value Ref Range   Hgb F Reflexed 0.0 - 2.0 %   Hgb A Reflexed 96.4 - 98.8 %   Hgb A2 3.7 (H) 1.8 - 3.2 %   Hgb S Reflexed 0.0 %   Interpretation, Hgb Fract Comment     Comment: (NOTE) Capillary Electrophoresis (CE) performed. Reflex to High-pressure liquid chromatography (HPLC) method for confirmation. Performed At: [facility] [place], [place] [id number] Rush Farmer MD [id number]   CBC with Differential/Platelet     Status: Abnormal   Collection Time: 05/13/22 12:13 PM  Result Value Ref Range   WBC 7.5 4.0 - 10.5 K/uL   RBC 6.52 (H) 3.87 - 5.11 MIL/uL   Hemoglobin 10.7 (L) 12.0 - 15.0 g/dL    Comment: Reticulocyte Hemoglobin testing may be clinically indicated, consider ordering this additional test [id number]    HCT 34.9 (L) 36.0 - 46.0 %   MCV 53.5 (L) 80.0 - 100.0 fL   MCH 16.4 (L) 26.0 - 34.0 pg   MCHC 30.7 30.0 - 36.0 g/dL   RDW 22.1 (H) 11.5 - 15.5 %   Platelets 346 150 - 400 K/uL    Comment: SPECIMEN CHECKED FOR CLOTS   nRBC 0.0 0.0 - 0.2 %   Neutrophils Relative % 57 %   Lymphocytes Relative 32 %   Monocytes Relative 7 %   Eosinophils Relative 3 %   Basophils Relative 1 %   Band Neutrophils 0 %   Metamyelocytes Relative 0 %   Myelocytes 0 %   Promyelocytes Relative 0 %   Blasts 0 %   nRBC 0 0 /100 WBC   Other 0 %   Neutro Abs 4.3 1.7 - 7.7 K/uL   Lymphs Abs 2.4 0.7 - 4.0 K/uL   Monocytes Absolute 0.5 0.1 - 1.0 K/uL   Eosinophils Absolute 0.2 0.0 - 0.5 K/uL   Basophils Absolute 0.1 0.0 - 0.1 K/uL   Abs Immature Granulocytes 0.00 0.00 - 0.07 K/uL   RBC Morphology ELLIPTOCYTES     Comment:  MICRO/HYPOCHROMIC CELLS NOTED ON SMEAR    WBC Morphology DIFF. CONFIRMED BY SMEAR    Smear Review PLATELETS APPEAR ADEQUATE     Comment: Normal platelet morphology Performed at [facility], [address] [place], [place] [address]   Hgb Fractionation by HPLC     Status: Abnormal   Collection Time: 05/13/22 12:13 PM  Result Value Ref Range   Hgb F 0.0 0.0 - 2.0 %   Hgb A 79.6 (L) 96.4 -  98.8 %   Hgb A2 Comment 1.8 - 3.2 %    Comment: See Capillary Electrophoresis (CE) result.   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 Hgb E is observed at a concentration less than the interpretation range which suggests: transfusion of a homozygous Hgb E patient, Hgb E trait with alpha-thalassemia, or Hgb E trait with iron deficiency anemia. Suggest clinical and hematologic correlation.          Hgb E-Trait Interpretation Ranges         Hgb A        >70.0%         Hgb E        <20.0%         Hgb A2       < 5.0% Performed At: [facility] [place], [place] [id number] Rush Farmer MD [id number]   Pulmonary Function Test     Status: None   Collection Time: 07/29/22 10:20 AM  Result Value Ref Range   FEV1     FVC     FEV1/FVC     TLC     DLCO      Radiology: DG Bone Density  Result Date: 07/22/2022 EXAM: DUAL X-RAY ABSORPTIOMETRY (DXA) FOR BONE MINERAL DENSITY IMPRESSION: Your patient Artia Singley completed a BMD test on 07/22/2022 using the [place] (analysis version: 14.10) manufactured by [employer]. The following summarizes the results of our evaluation. Technologist: VLM PATIENT BIOGRAPHICAL: Name: Makaylie, Dedeaux Patient ID: [id number] Birth Date: June 26, 1981 Height: 60.0 in. Gender: Female Exam Date: 07/22/2022 Weight: 228.0 lbs. Indications: History of  stroke, Premenopausal Fractures: Treatments: Albuterol, Vitamin D ASSESSMENT: The BMD measured at Femur Neck Right is 1.176 g/cm2 with a Z-score of 1.4. The Z score is within the expected range for age. The scan quality is good. ISCD recommends using Z-scores for assessment of pre-menopausal women, men between 66 and 50 years, and children under 20 years. The diagnosis of osteoporosis in these patients should not be made on the basis of densitometric cirteria alone. Site Region Measured Measured WHO Age Matched BMD Date       Age      Classification Z-Score AP Spine  L1-L4      07/22/2022 40.7     N/A 1.6         1.389 g/cm2 DualFemur Neck Right 07/22/2022 40.7 1.4 1.176 g/cm2 RECOMMENDATIONS: All patients should optimize calcium and vitamin D intake. FOLLOW-UP: People with diagnosed cases of osteoporosis or at high risk for fracture should have regular bone mineral density tests. For patients eligible for Medicare, routine testing is allowed once every 2 years. The testing frequency can be increased to one year for patients who have rapidly progressing disease, those who are receiving or discontinuing medical therapy to restore bone mass, or have additional risk factors. I have reviewed this report, and agree with the above findings. [facility] Radiology Electronically Signed   By: Zerita Boers M.D.   On: 07/22/2022 10:54    No results found.  DG Bone Density  Result Date: 07/22/2022 EXAM: DUAL X-RAY ABSORPTIOMETRY (DXA) FOR BONE MINERAL DENSITY IMPRESSION: Your patient Hester Joslin completed a BMD test on 07/22/2022 using the [place] (analysis version:  14.10) manufactured by [employer]. The following summarizes the results of our evaluation. Technologist: VLM PATIENT BIOGRAPHICAL: Name: Kenasia, Scheller Patient ID: [id number] Birth Date: [birth year] Height: 60.0 in. Gender: Female Exam Date: 07/22/2022 Weight: 228.0 lbs. Indications: History of stroke, Premenopausal Fractures:  Treatments: Albuterol, Vitamin D ASSESSMENT: The BMD measured at Femur Neck Right is 1.176 g/cm2 with a Z-score of 1.4. The Z score is within the expected range for age. The scan quality is good. ISCD recommends using Z-scores for assessment of pre-menopausal women, men between 101 and 50 years, and children under 20 years. The diagnosis of osteoporosis in these patients should not be made on the basis of densitometric cirteria alone. Site Region Measured Measured WHO Age Matched BMD Date       Age      Classification Z-Score AP Spine  L1-L4      07/22/2022 40.7     N/A 1.6         1.389 g/cm2 DualFemur Neck Right 07/22/2022 40.7 1.4 1.176 g/cm2 RECOMMENDATIONS: All patients should optimize calcium and vitamin D intake. FOLLOW-UP: People with diagnosed cases of osteoporosis or at high risk for fracture should have regular bone mineral density tests. For patients eligible for Medicare, routine testing is allowed once every 2 years. The testing frequency can be increased to one year for patients who have rapidly progressing disease, those who are receiving or discontinuing medical therapy to restore bone mass, or have additional risk factors. I have reviewed this report, and agree with the above findings. [facility] Radiology Electronically Signed   By: Zerita Boers M.D.   On: 07/22/2022 10:54      Assessment and Plan: Patient Active Problem List   Diagnosis Date Noted   Essential hypertension 07/29/2022   Fatty liver 07/29/2022   Hyperlipidemia 06/08/2022   Anemia 05/13/2022   OSA (obstructive sleep apnea) 03/15/2021   Other hypersomnia 03/15/2021   Acute pain of left knee 08/12/2020   Baker cyst, left 08/12/2020   Flu vaccine need 08/12/2020   History of stroke 07/21/2018   Cerebrovascular accident (CVA) ([place]) 12/02/2017   Headache disorder 12/02/2017   Left sided numbness 11/09/2017   Subluxation of patellofemoral joint 10/05/2015    1. Obesity, morbid ([place]) Obesity Counseling: Had a lengthy  discussion regarding patients BMI and weight issues. Patient was instructed on portion control as well as increased activity. Also discussed caloric restrictions with trying to maintain intake less than 2000 Kcal. Discussions were made in accordance with the 5As of weight management. Simple actions such as not eating late and if able to, taking a walk is suggested.   2. Mild intermittent asthma without complication Under control only requires PRN inhalers  3. OSA (obstructive sleep apnea) Will continue with PAP therapy  4. CPAP use counseling CPAP Counseling: had a lengthy discussion with the patient regarding the importance of PAP therapy in management of the sleep apnea.    General Counseling: I have discussed the findings of the evaluation and examination with Mylasia.  I have also discussed any further diagnostic evaluation thatmay be needed or ordered today. Caliana verbalizes understanding of the findings of todays visit. We also reviewed her medications today and discussed drug interactions and side effects including but not limited excessive drowsiness and altered mental states. We also discussed that there is always a risk not just to her but also people around her. she has been encouraged to call the office with any questions or concerns that should arise related to todays  visit.  No orders of the defined types were placed in this encounter.    Time spent: 66  I have personally obtained a history, examined the patient, evaluated laboratory and imaging results, formulated the assessment and plan and placed orders.    Allyne Gee, MD [facility] Pulmonary and Critical Care Sleep medicine

## 2022-08-05 NOTE — Patient Instructions (Signed)

## 2022-08-21 DIAGNOSIS — G4733 Obstructive sleep apnea (adult) (pediatric): Secondary | ICD-10-CM | POA: Diagnosis not present

## 2022-09-05 ENCOUNTER — Inpatient Hospital Stay: Payer: BC Managed Care – PPO

## 2022-09-05 ENCOUNTER — Inpatient Hospital Stay: Payer: BC Managed Care – PPO | Admitting: Oncology

## 2022-09-05 ENCOUNTER — Other Ambulatory Visit: Payer: Self-pay | Admitting: Oncology

## 2022-09-05 ENCOUNTER — Other Ambulatory Visit: Payer: Self-pay | Admitting: Nurse Practitioner

## 2022-09-05 DIAGNOSIS — I10 Essential (primary) hypertension: Secondary | ICD-10-CM

## 2022-09-11 NOTE — Telephone Encounter (Signed)
Send message to alyssa 

## 2022-09-13 ENCOUNTER — Encounter: Payer: Self-pay | Admitting: Nurse Practitioner

## 2022-09-17 ENCOUNTER — Telehealth: Payer: Self-pay | Admitting: *Deleted

## 2022-09-17 NOTE — Telephone Encounter (Signed)
Dr Smith Robert sent referral for [facility] benign hematology jas reached out to her to get an appt and unable to connect wit the pt. So they are cancelling the referral.

## 2022-09-20 DIAGNOSIS — G4733 Obstructive sleep apnea (adult) (pediatric): Secondary | ICD-10-CM | POA: Diagnosis not present

## 2022-10-21 DIAGNOSIS — G4733 Obstructive sleep apnea (adult) (pediatric): Secondary | ICD-10-CM | POA: Diagnosis not present

## 2022-10-28 ENCOUNTER — Inpatient Hospital Stay: Payer: BC Managed Care – PPO | Admitting: Oncology

## 2022-10-28 ENCOUNTER — Inpatient Hospital Stay: Payer: BC Managed Care – PPO

## 2022-11-07 ENCOUNTER — Other Ambulatory Visit: Payer: Self-pay | Admitting: Nurse Practitioner

## 2022-11-07 DIAGNOSIS — E559 Vitamin D deficiency, unspecified: Secondary | ICD-10-CM

## 2022-11-11 ENCOUNTER — Ambulatory Visit (INDEPENDENT_AMBULATORY_CARE_PROVIDER_SITE_OTHER): Payer: BC Managed Care – PPO | Admitting: Internal Medicine

## 2022-11-11 ENCOUNTER — Telehealth: Payer: Self-pay | Admitting: Nurse Practitioner

## 2022-11-11 ENCOUNTER — Encounter: Payer: Self-pay | Admitting: Oncology

## 2022-11-11 ENCOUNTER — Inpatient Hospital Stay (HOSPITAL_BASED_OUTPATIENT_CLINIC_OR_DEPARTMENT_OTHER): Payer: BC Managed Care – PPO | Admitting: Oncology

## 2022-11-11 ENCOUNTER — Telehealth: Payer: Self-pay | Admitting: Internal Medicine

## 2022-11-11 ENCOUNTER — Encounter: Payer: Self-pay | Admitting: Internal Medicine

## 2022-11-11 ENCOUNTER — Inpatient Hospital Stay: Payer: BC Managed Care – PPO | Attending: Oncology

## 2022-11-11 VITALS — BP 120/74 | HR 92 | Temp 97.8°F | Resp 18 | Ht 60.0 in | Wt 236.0 lb

## 2022-11-11 VITALS — BP 110/73 | HR 76 | Temp 98.5°F | Resp 16 | Ht 59.0 in | Wt 236.8 lb

## 2022-11-11 DIAGNOSIS — D560 Alpha thalassemia: Secondary | ICD-10-CM

## 2022-11-11 DIAGNOSIS — L308 Other specified dermatitis: Secondary | ICD-10-CM

## 2022-11-11 DIAGNOSIS — E782 Mixed hyperlipidemia: Secondary | ICD-10-CM

## 2022-11-11 DIAGNOSIS — Z1231 Encounter for screening mammogram for malignant neoplasm of breast: Secondary | ICD-10-CM | POA: Diagnosis not present

## 2022-11-11 DIAGNOSIS — L219 Seborrheic dermatitis, unspecified: Secondary | ICD-10-CM | POA: Diagnosis not present

## 2022-11-11 DIAGNOSIS — R7989 Other specified abnormal findings of blood chemistry: Secondary | ICD-10-CM

## 2022-11-11 DIAGNOSIS — D509 Iron deficiency anemia, unspecified: Secondary | ICD-10-CM | POA: Diagnosis not present

## 2022-11-11 LAB — COMPREHENSIVE METABOLIC PANEL
ALT: 77 U/L — ABNORMAL HIGH (ref 0–44)
AST: 42 U/L — ABNORMAL HIGH (ref 15–41)
Albumin: 3.6 g/dL (ref 3.5–5.0)
Alkaline Phosphatase: 60 U/L (ref 38–126)
Anion gap: 9 (ref 5–15)
BUN: 9 mg/dL (ref 6–20)
CO2: 25 mmol/L (ref 22–32)
Calcium: 8.9 mg/dL (ref 8.9–10.3)
Chloride: 104 mmol/L (ref 98–111)
Creatinine, Ser: 0.56 mg/dL (ref 0.44–1.00)
GFR, Estimated: >60 mL/min (ref >60)
Glucose, Bld: 109 mg/dL — ABNORMAL HIGH (ref 70–99)
Potassium: 3.3 mmol/L — ABNORMAL LOW (ref 3.5–5.1)
Sodium: 138 mmol/L (ref 135–145)
Total Bilirubin: 0.6 mg/dL (ref 0.3–1.2)
Total Protein: 6.9 g/dL (ref 6.5–8.1)

## 2022-11-11 LAB — CBC WITH DIFFERENTIAL/PLATELET
Abs Immature Granulocytes: 0.06 10*3/uL (ref 0.00–0.07)
Basophils Absolute: 0.1 10*3/uL (ref 0.0–0.1)
Basophils Relative: 1 %
Eosinophils Absolute: 0.3 10*3/uL (ref 0.0–0.5)
Eosinophils Relative: 4 %
HCT: 34 % — ABNORMAL LOW (ref 36.0–46.0)
Hemoglobin: 10.7 g/dL — ABNORMAL LOW (ref 12.0–15.0)
Immature Granulocytes: 1 %
Lymphocytes Relative: 29 %
Lymphs Abs: 2.3 10*3/uL (ref 0.7–4.0)
MCH: 16.6 pg — ABNORMAL LOW (ref 26.0–34.0)
MCHC: 31.5 g/dL (ref 30.0–36.0)
MCV: 52.9 fL — ABNORMAL LOW (ref 80.0–100.0)
Monocytes Absolute: 0.5 10*3/uL (ref 0.1–1.0)
Monocytes Relative: 6 %
Neutro Abs: 4.7 10*3/uL (ref 1.7–7.7)
Neutrophils Relative %: 59 %
Platelets: 329 10*3/uL (ref 150–400)
RBC: 6.43 MIL/uL — ABNORMAL HIGH (ref 3.87–5.11)
RDW: 22.4 % — ABNORMAL HIGH (ref 11.5–15.5)
WBC: 7.9 10*3/uL (ref 4.0–10.5)
nRBC: 0 % (ref 0.0–0.2)

## 2022-11-11 LAB — IRON AND TIBC
Iron: 80 ug/dL (ref 28–170)
Saturation Ratios: 21 % (ref 10.4–31.8)
TIBC: 379 ug/dL (ref 250–450)
UIBC: 299 ug/dL

## 2022-11-11 LAB — FERRITIN: Ferritin: 79 ng/mL (ref 11–307)

## 2022-11-11 MED ORDER — BETAMETHASONE DIPROPIONATE AUG 0.05 % EX OINT: TOPICAL_OINTMENT | Freq: Two times a day (BID) | CUTANEOUS | 0 refills | Status: AC

## 2022-11-11 MED ORDER — ROSUVASTATIN CALCIUM 5 MG PO TABS: ORAL_TABLET | ORAL | 1 refills | Status: DC

## 2022-11-11 NOTE — Telephone Encounter (Signed)
Called pt to provide date, time, and location of MM appt-nm

## 2022-11-11 NOTE — Progress Notes (Signed)
[facility] [place], [place] [address]  Internal MEDICINE  Office Visit Note  Patient Name: Natasha Moreno  [id number]  [id number]  Date of Service: 11/11/2022  Chief Complaint  Patient presents with   Rash    Neck and scalp    HPI Pt is seen for acute visit Has rash on left side of the neck, gets worse at times, thinks it might be Psoriasis ( no white scaly rash) She also C/O itching on her scalp Has h/o cryptogenic stroke, no anticoagulation, was advised to lose wt   Has been trying to lose wt, stopped Semaglutide due to excessive nausea  H/o fatty liver, has been on statin previously     Current Medication: Outpatient Encounter Medications as of 11/11/2022  Medication Sig   albuterol (PROAIR HFA) 108 (90 Base) MCG/ACT inhaler TAKE 2 PUFFS BY MOUTH EVERY 6 HOURS AS NEEDED FOR WHEEZE OR SHORTNESS OF BREATH   augmented betamethasone dipropionate (DIPROLENE) 0.05 % ointment Apply topically 2 (two) times daily.   cetirizine (ZYRTEC) 10 MG tablet Take 10 mg by mouth daily as needed for allergies.    famotidine (PEPCID) 20 MG tablet Take 1 tablet (20 mg total) by mouth 2 (two) times daily.   folic acid (FOLVITE) 1 MG tablet TAKE 1 TABLET BY MOUTH EVERY DAY   hydrochlorothiazide (HYDRODIURIL) 12.5 MG tablet TAKE 1 TABLET BY MOUTH EVERY DAY   rosuvastatin (CRESTOR) 5 MG tablet Take one tab po 3 x a week   Vitamin D, Ergocalciferol, (DRISDOL) 1.25 MG (50000 UNIT) CAPS capsule Take 1 capsule (50,000 Units total) by mouth every 7 (seven) days.   [DISCONTINUED] Evolocumab (REPATHA SURECLICK) [id number] MG/ML SOAJ Inject 140 mg into the skin every 14 (fourteen) days.   [DISCONTINUED] Semaglutide-Weight Management 2.4 MG/0.75ML SOAJ Inject 2.4 mg into the skin once a week.   [DISCONTINUED] traZODone (DESYREL) 50 MG tablet Take 50-100 mg by mouth at bedtime as needed.   No facility-administered encounter medications on file as of 11/11/2022.    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[place])    Hyperlipidemia    Hypertension    Migraine    Morbid obesity ([place])    Sleep apnea    uses CPAP   Stroke [facility])     Family History: Family History  Problem Relation Age of Onset   Kidney failure Father    Heart attack Father        open heart surgery   Hypertension Father    Diabetes Father    Gout Father    Cataracts Father     Social History   Socioeconomic History   Marital status: Single    Spouse name: Not on file   Number of children: 2   Years of education: Not on file   Highest education level: High school graduate  Occupational History   Not on file  Tobacco Use   Smoking status: Former  Packs/day: 0.25    Types: Cigarettes    Quit date: 11/09/2017    Years since quitting: 5.0   Smokeless tobacco: Never  Vaping Use   Vaping Use: Former  Substance and Sexual Activity   Alcohol use: Yes    Comment: every other weekend, not much    Drug use: No   Sexual activity: Yes    Birth control/protection: Condom  Other Topics Concern   Not on file  Social History Narrative   Lives at home with her parents, brother and her children   Right handed   Drinks rare caffeine    Social Determinants of Health   Financial Resource Strain: Low Risk  (11/09/2017)   Overall Financial Resource Strain (CARDIA)    Difficulty of Paying Living Expenses: Not hard at all  Food Insecurity:  No Food Insecurity (11/09/2017)   Hunger Vital Sign    Worried About Running Out of Food in the Last Year: Never true    [place] in the Last Year: Never true  Transportation Needs: No Transportation Needs (01/06/2018)   PRAPARE - Hydrologist (Medical): No    Lack of Transportation (Non-Medical): No  Recent Concern: Transportation Needs - Unmet Transportation Needs (11/09/2017)   PRAPARE - Transportation    Lack of Transportation (Medical): Yes    Lack of Transportation (Non-Medical): Yes  Physical Activity: Sufficiently Active (11/09/2017)   Exercise Vital Sign    Days of Exercise per Week: 3 days    Minutes of Exercise per Session: 50 min  Stress: No Stress Concern Present (11/09/2017)   [place]    Feeling of Stress : Not at all  Social Connections: Unknown (11/09/2017)   Social Connection and Isolation Panel [NHANES]    Frequency of Communication with Friends and Family: More than three times a week    Frequency of Social Gatherings with Friends and Family: Once a week    Attends Religious Services: Never    Marine scientist or Organizations: No    Attends Archivist Meetings: Never    Marital Status: Not on file  Intimate Partner Violence: Not on file      Review of Systems  Constitutional:  Negative for fatigue and fever.  HENT:  Negative for congestion, mouth sores and postnasal drip.   Respiratory:  Negative for cough.   Cardiovascular:  Negative for chest pain.  Genitourinary:  Negative for flank pain.  Skin:  Positive for rash.  Psychiatric/Behavioral: Negative.      Vital Signs: BP 110/73   Pulse 76   Temp 98.5 F (36.9 C)   Resp 16   Ht 4' 11"$  (1.499 m)   Wt 236 lb 12.8 oz (107.4 kg)   SpO2 97%   BMI 47.83 kg/m    Physical Exam Constitutional:      Appearance: Normal appearance.  HENT:     Head: Normocephalic and atraumatic.      Nose: Nose normal.     Mouth/Throat:     Mouth: Mucous membranes are moist.     Pharynx: No posterior oropharyngeal erythema.  Eyes:     Extraocular Movements: Extraocular movements intact.     Pupils: Pupils are equal, round, and reactive to light.  Cardiovascular:     Pulses: Normal pulses.     Heart sounds: Normal heart sounds.  Pulmonary:     Effort: Pulmonary effort is normal.  Breath sounds: Normal breath sounds.  Skin:    Findings: Erythema and rash present.     Comments: Neckline skin fold  Neurological:     General: No focal deficit present.     Mental Status: She is alert.  Psychiatric:        Mood and Affect: Mood normal.        Behavior: Behavior normal.    Assessment/Plan: 1. Other eczema Left sided skin fold peculiar of eczema ( self diagnosed psoriasis)  Pt will see dermatology  - augmented betamethasone dipropionate (DIPROLENE) 0.05 % ointment; Apply topically 2 (two) times daily.  Dispense: 30 g; Refill: 0 - Ambulatory referral to Dermatology  2. Seborrheic dermatitis of scalp Avoid topical scalp steroids until seen by dermatology  - Ambulatory referral to Dermatology  3. Mixed hyperlipidemia Has h/o Cryptogenic stroke, MRI of brain showed acute infarct of the right side base of brain consistent with weakness. 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 Visit for screening mammogram - MM 3D SCREEN BREAST BILATERAL; Future   General Counseling: yolette belinski understanding of the findings of todays visit and agrees with plan of treatment. I have discussed any further diagnostic evaluation that may be needed or ordered today. We also reviewed her medications today. she has been encouraged to call the office with any questions or concerns that should arise  related to todays visit.    Orders Placed This Encounter  Procedures   MM 3D SCREEN BREAST BILATERAL   Ambulatory referral to Dermatology    Meds ordered this encounter  Medications   augmented betamethasone dipropionate (DIPROLENE) 0.05 % ointment    Sig: Apply topically 2 (two) times daily.    Dispense:  30 g    Refill:  0   rosuvastatin (CRESTOR) 5 MG tablet    Sig: Take one tab po 3 x a week    Dispense:  24 tablet    Refill:  1    Total time spent:35 Minutes Time spent includes review of chart, medications, test results, and follow up plan with the patient.   [place] Controlled Substance Database was reviewed by me.   Dr Lavera Guise Internal medicine

## 2022-11-11 NOTE — Telephone Encounter (Signed)
Awaiting 11/11/22 office notes for Dermatology referral-[facility]

## 2022-11-12 NOTE — Progress Notes (Signed)
Hematology/Oncology Consult note [facility]  [phone] [phone]  Patient Care Team: Jonetta Osgood, NP as PCP - General (Nurse Practitioner)   Name of the patient: Natasha Moreno  [id number]  05-Dec-1980   Date of visit: 11/12/22  Diagnosis-hemoglobin H disease  Chief complaint/ Reason for visit-routine follow-up of hemoglobin H disease  Heme/Onc history: patient is a 42 year oldFemale referred for microcytic anemia her most recent CBC from 04/08/2022 showed white cell count of 6.4, H&H of 10.5/34 and a platelet count of 371.  MCV was low at 53 with an increased RBC volume of 6.44.  Of note patient has had chronic microcytic anemia with an MCV between 51-54 and hemoglobin between 10-11 at least dating back to 2019.  She states that she has been anemic since childhood.  Possible anemia in her mother and sister as well.  She is not aware of any personal or family history of thalassemia.  She has had cholecystectomy in the past denies any blood loss in her stool or urine.  Her recent iron studies from July 2023 were normal.   Hemoglobin electrophoresis showed elevated hemoglobin A2 at 3.7%.  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    Interval history-patient feels well overall and denies any specific complaints at this time.  She has been compliant with her folic acid supplements daily.  ECOG PS- 0 Pain scale- 0   Review of systems- Review of Systems  Constitutional:  Positive for malaise/fatigue. Negative for chills, fever and weight loss.  HENT:  Negative for congestion, ear discharge and nosebleeds.   Eyes:  Negative for blurred vision.  Respiratory:  Negative for cough,  hemoptysis, sputum production, shortness of breath and wheezing.   Cardiovascular:  Negative for chest pain, palpitations, orthopnea and claudication.  Gastrointestinal:  Negative for abdominal pain, blood in stool, constipation, diarrhea, heartburn, melena, nausea and vomiting.  Genitourinary:  Negative for dysuria, flank pain, frequency, hematuria and urgency.  Musculoskeletal:  Negative for back pain, joint pain and myalgias.  Skin:  Negative for rash.  Neurological:  Negative for dizziness, tingling, focal weakness, seizures, weakness and headaches.  Endo/Heme/Allergies:  Does not bruise/bleed easily.  Psychiatric/Behavioral:  Negative for depression and suicidal ideas. The patient does not have insomnia.       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[place])    Hyperlipidemia    Hypertension    Migraine    Morbid obesity ([place])    Sleep apnea    uses CPAP   [facility]roke [facility])      Past Surgical History:  Procedure Laterality Date   CHOLECYSTECTOMY     TEE WITHOUT CARDIOVERSION N/A 11/11/2017   Procedure: TRANSESOPHAGEAL ECHOCARDIOGRAM (TEE);  Surgeon: Teodoro Spray, MD;  Location: [facility] ORS;  Service: Cardiovascular;  Laterality: N/A;   TONSILLECTOMY AND ADENOIDECTOMY Bilateral 11/06/2021   Procedure: TONSILLECTOMY;  Surgeon: Clyde Canterbury, MD;  Location: [facility] ORS;  Service: ENT;  Laterality: Bilateral;   TUBAL LIGATION     WISDOM TOOTH EXTRACTION      Social History   Socioeconomic History   Marital status: Single    Spouse name: Not on file   Number of children: 2   Years of education: Not  on file   Highest education level: High school graduate  Occupational History   Not on file  Tobacco Use   Smoking status: Former    Packs/day: 0.25    Types: Cigarettes    Quit date: 11/09/2017    Years since quitting: 5.0   Smokeless tobacco: Never  Vaping Use   Vaping Use: Former  Substance and Sexual Activity   Alcohol  use: Yes    Comment: every other weekend, not much    Drug use: No   Sexual activity: Yes    Birth control/protection: Condom  Other Topics Concern   Not on file  Social History Narrative   Lives at home with her parents, brother and her children   Right handed   Drinks rare caffeine    Social Determinants of Health   Financial Resource Strain: Low Risk  (11/09/2017)   Overall Financial Resource Strain (CARDIA)    Difficulty of Paying Living Expenses: Not hard at all  Food Insecurity: No Food Insecurity (11/09/2017)   Hunger Vital Sign    Worried About Running Out of Food in the Last Year: Never true    [place] in the Last Year: Never true  Transportation Needs: No Transportation Needs (01/06/2018)   PRAPARE - Hydrologist (Medical): No    Lack of Transportation (Non-Medical): No  Recent Concern: Transportation Needs - Unmet Transportation Needs (11/09/2017)   PRAPARE - Transportation    Lack of Transportation (Medical): Yes    Lack of Transportation (Non-Medical): Yes  Physical Activity: Sufficiently Active (11/09/2017)   Exercise Vital Sign    Days of Exercise per Week: 3 days    Minutes of Exercise per Session: 50 min  Stress: No Stress Concern Present (11/09/2017)   [place]    Feeling of Stress : Not at all  Social Connections: Unknown (11/09/2017)   Social Connection and Isolation Panel [NHANES]    Frequency of Communication with Friends and Family: More than three times a week    Frequency of Social Gatherings with Friends and Family: Once a week    Attends Religious Services: Never    Marine scientist or Organizations: No    Attends Music therapist: Never    Marital Status: Not on file  Intimate Partner Violence: Not on file    Family History  Problem Relation Age of Onset   Kidney failure Father    Heart attack Father        open heart  surgery   Hypertension Father    Diabetes Father    Gout Father    Cataracts Father      Current Outpatient Medications:    albuterol (PROAIR HFA) 108 (90 Base) MCG/ACT inhaler, TAKE 2 PUFFS BY MOUTH EVERY 6 HOURS AS NEEDED FOR WHEEZE OR SHORTNESS OF BREATH, Disp: 8.5 each, Rfl: 1   augmented betamethasone dipropionate (DIPROLENE) 0.05 % ointment, Apply topically 2 (two) times daily., Disp: 30 g, Rfl: 0   cetirizine (ZYRTEC) 10 MG tablet, Take 10 mg by mouth daily as needed for allergies. , Disp: , Rfl:    famotidine (PEPCID) 20 MG tablet, Take 1 tablet (20 mg total) by mouth 2 (two) times daily., Disp: 180 tablet, Rfl: 1   folic acid (FOLVITE) 1 MG tablet, TAKE 1 TABLET BY MOUTH EVERY DAY, Disp: 90 tablet, Rfl: 1   hydrochlorothiazide (HYDRODIURIL) 12.5 MG tablet, TAKE 1 TABLET BY MOUTH  EVERY DAY, Disp: 90 tablet, Rfl: 1   rosuvastatin (CRESTOR) 5 MG tablet, Take one tab po 3 x a week, Disp: 24 tablet, Rfl: 1   Vitamin D, Ergocalciferol, (DRISDOL) 1.25 MG (50000 UNIT) CAPS capsule, Take 1 capsule (50,000 Units total) by mouth every 7 (seven) days., Disp: 12 capsule, Rfl: 1  Physical exam:  Vitals:   11/11/22 1509  BP: 120/74  Pulse: 92  Resp: 18  Temp: 97.8 F (36.6 C)  TempSrc: Tympanic  SpO2: 100%  Weight: 236 lb (107 kg)  Height: 5' (1.524 m)   Physical Exam Cardiovascular:     Rate and Rhythm: Normal rate and regular rhythm.     Heart sounds: Normal heart sounds.  Pulmonary:     Effort: Pulmonary effort is normal.     Breath sounds: Normal breath sounds.  Abdominal:     General: Bowel sounds are normal.     Palpations: Abdomen is soft.  Skin:    General: Skin is warm and dry.  Neurological:     Mental Status: She is alert and oriented to person, place, and time.         Latest Ref Rng & Units 11/11/2022    2:53 PM  CMP  Glucose 70 - 99 mg/dL 109   BUN 6 - 20 mg/dL 9   Creatinine 0.44 - 1.00 mg/dL 0.56   Sodium 135 - 145 mmol/L 138   Potassium 3.5 - 5.1  mmol/L 3.3   Chloride 98 - 111 mmol/L 104   CO2 22 - 32 mmol/L 25   Calcium 8.9 - 10.3 mg/dL 8.9   Total Protein 6.5 - 8.1 g/dL 6.9   Total Bilirubin 0.3 - 1.2 mg/dL 0.6   Alkaline Phos 38 - 126 U/L 60   AST 15 - 41 U/L 42   ALT 0 - 44 U/L 77       Latest Ref Rng & Units 11/11/2022    2:53 PM  CBC  WBC 4.0 - 10.5 K/uL 7.9   Hemoglobin 12.0 - 15.0 g/dL 10.7   Hematocrit 36.0 - 46.0 % 34.0   Platelets 150 - 400 K/uL 329      Assessment and plan- Patient is a 42 y.o. 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  She has chronic microcytic anemia with a hemoglobin that has remained stable between 10-11.  She does notHave any evidence of iron deficiency presently.  She will continue folic acid supplements for her hemoglobin H disease her bone density scan was normal.  She has mildly abnormal LFTs likely secondary to fatty liver in the setting of obesity.  I have informed her primary care provider about this so that she can follow this up and refer her to GI as needed.  CBC with differential CMP ferritin iron studies LDH, haptoglobin and reticulocyte count in 6 months and I will see her thereafter   Visit Diagnosis 1. 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 Microcytic anemia      Dr. Randa Evens, MD, MPH [facility] [id number] 11/12/2022 3:11 PM

## 2022-11-17 ENCOUNTER — Telehealth: Payer: Self-pay | Admitting: Internal Medicine

## 2022-11-17 NOTE — Telephone Encounter (Signed)
Dermatology referral sent via [facility] to [facility]

## 2022-11-18 ENCOUNTER — Telehealth: Payer: Self-pay | Admitting: Nurse Practitioner

## 2022-11-18 NOTE — Telephone Encounter (Signed)
Dermatology appointment>> 11/25/22 with [facility]

## 2022-11-28 ENCOUNTER — Ambulatory Visit
Admission: RE | Admit: 2022-11-28 | Discharge: 2022-11-28 | Disposition: A | Discharge status: Home / Self Care | Payer: BC Managed Care – PPO | Source: Ambulatory Visit | Attending: Internal Medicine | Admitting: Internal Medicine

## 2022-11-28 DIAGNOSIS — Z1231 Encounter for screening mammogram for malignant neoplasm of breast: Secondary | ICD-10-CM | POA: Diagnosis not present

## 2023-01-27 ENCOUNTER — Ambulatory Visit: Payer: Medicaid Other | Admitting: Nurse Practitioner

## 2023-02-02 ENCOUNTER — Ambulatory Visit: Payer: Medicaid Other | Admitting: Nurse Practitioner

## 2023-02-02 ENCOUNTER — Encounter: Payer: Self-pay | Admitting: Nurse Practitioner

## 2023-02-02 VITALS — BP 120/84 | HR 90 | Temp 98.1°F | Resp 16 | Ht 60.0 in | Wt 242.0 lb

## 2023-02-02 DIAGNOSIS — L219 Seborrheic dermatitis, unspecified: Secondary | ICD-10-CM | POA: Diagnosis not present

## 2023-02-02 DIAGNOSIS — I10 Essential (primary) hypertension: Secondary | ICD-10-CM | POA: Diagnosis not present

## 2023-02-02 DIAGNOSIS — L723 Sebaceous cyst: Secondary | ICD-10-CM

## 2023-02-02 MED ORDER — HYDROCHLOROTHIAZIDE 12.5 MG PO TABS: 12.5000 mg | ORAL_TABLET | Freq: Every day | ORAL | 1 refills | Status: DC

## 2023-02-02 MED ORDER — KETOCONAZOLE 2 % EX SHAM
1.0000 | MEDICATED_SHAMPOO | CUTANEOUS | 3 refills | Status: DC
Start: 2023-02-02 — End: 2023-11-25

## 2023-02-02 NOTE — Progress Notes (Signed)
[facility] [address] [place], [place] [address]  Internal MEDICINE  Office Visit Note  Patient Name: Natasha Moreno  [id number]  [id number]  Date of Service: 02/02/2023  Chief Complaint  Patient presents with   Hyperlipidemia   Hypertension   Follow-up    HPI Natasha Moreno presents for a follow-up visit for hypertension and seborrheic dermatitis.  Hypertension -- controlled by current medications Spot under arm -- likely cyst or developing boil.  Rash of scalp -- seborrheic dermatitis. Bothering her more often. Using OTC products but they are not working very well.     Current Medication: Outpatient Encounter Medications as of 02/02/2023  Medication Sig   albuterol (PROAIR HFA) 108 (90 Base) MCG/ACT inhaler TAKE 2 PUFFS BY MOUTH EVERY 6 HOURS AS NEEDED FOR WHEEZE OR SHORTNESS OF BREATH   augmented betamethasone dipropionate (DIPROLENE) 0.05 % ointment Apply topically 2 (two) times daily.   cetirizine (ZYRTEC) 10 MG tablet Take 10 mg by mouth daily as needed for allergies.    famotidine (PEPCID) 20 MG tablet Take 1 tablet (20 mg total) by mouth 2 (two) times daily.   folic acid (FOLVITE) 1 MG tablet TAKE 1 TABLET BY MOUTH EVERY DAY   ketoconazole (NIZORAL) 2 % shampoo Apply 1 Application topically 2 (two) times a week.   rosuvastatin (CRESTOR) 5 MG tablet Take one tab po 3 x a week   Vitamin D, Ergocalciferol, (DRISDOL) 1.25 MG (50000 UNIT) CAPS capsule Take 1 capsule (50,000 Units total) by mouth every 7 (seven) days.   [DISCONTINUED] hydrochlorothiazide (HYDRODIURIL) 12.5 MG tablet TAKE 1 TABLET BY MOUTH EVERY DAY   hydrochlorothiazide (HYDRODIURIL) 12.5 MG tablet Take 1 tablet (12.5 mg total) by mouth daily.   No facility-administered encounter medications on file as of 02/02/2023.    Surgical History: Past Surgical History:  Procedure Laterality Date   CHOLECYSTECTOMY     TEE WITHOUT CARDIOVERSION N/A 11/11/2017   Procedure: TRANSESOPHAGEAL ECHOCARDIOGRAM  (TEE);  Surgeon: Dalia Heading, MD;  Location: [facility] ORS;  Service: Cardiovascular;  Laterality: N/A;   TONSILLECTOMY AND ADENOIDECTOMY Bilateral 11/06/2021   Procedure: TONSILLECTOMY;  Surgeon: Geanie Logan, MD;  Location: [facility] ORS;  Service: ENT;  Laterality: Bilateral;   TUBAL LIGATION     WISDOM TOOTH EXTRACTION      Medical History: Past Medical History:  Diagnosis Date   Anemia    Hemoglobin E-A disorder (HCC)    Hyperlipidemia    Hypertension    Migraine    Morbid obesity (HCC)    Sleep apnea    uses CPAP   Stroke [facility])     Family History: Family History  Problem Relation Age of Onset   Kidney failure Father    Heart attack Father        open heart surgery   Hypertension Father    Diabetes Father    Gout Father    Cataracts Father     Social History   Socioeconomic History   Marital status: Single    Spouse name: Not on file   Number of children: 2   Years of education: Not on file   Highest education level: High school graduate  Occupational History   Not on file  Tobacco Use   Smoking status: Former    Packs/day: .25    Types: Cigarettes    Quit date: 11/09/2017    Years since quitting: 5.2   Smokeless tobacco: Never  Vaping Use   Vaping Use: Former  Substance and Sexual Activity  Alcohol use: Yes    Comment: every other weekend, not much    Drug use: No   Sexual activity: Yes    Birth control/protection: Condom  Other Topics Concern   Not on file  Social History Narrative   Lives at home with her parents, brother and her children   Right handed   Drinks rare caffeine    Social Determinants of Health   Financial Resource Strain: Low Risk  (11/09/2017)   Overall Financial Resource Strain (CARDIA)    Difficulty of Paying Living Expenses: Not hard at all  Food Insecurity: No Food Insecurity (11/09/2017)   Hunger Vital Sign    Worried About Running Out of Food in the Last Year: Never true    Ran Out of Food in the Last Year: Never true   Transportation Needs: No Transportation Needs (01/06/2018)   PRAPARE - Administrator, Civil Service (Medical): No    Lack of Transportation (Non-Medical): No  Recent Concern: Transportation Needs - Unmet Transportation Needs (11/09/2017)   PRAPARE - Transportation    Lack of Transportation (Medical): Yes    Lack of Transportation (Non-Medical): Yes  Physical Activity: Sufficiently Active (11/09/2017)   Exercise Vital Sign    Days of Exercise per Week: 3 days    Minutes of Exercise per Session: 50 min  Stress: No Stress Concern Present (11/09/2017)   [employer] of Occupational Health - Occupational Stress Questionnaire    Feeling of Stress : Not at all  Social Connections: Unknown (11/09/2017)   Social Connection and Isolation Panel [NHANES]    Frequency of Communication with Friends and Family: More than three times a week    Frequency of Social Gatherings with Friends and Family: Once a week    Attends Religious Services: Never    Database administrator or Organizations: No    Attends Banker Meetings: Never    Marital Status: Not on file  Intimate Partner Violence: Not on file      Review of Systems  Constitutional:  Negative for fatigue and fever.  HENT:  Negative for congestion, mouth sores and postnasal drip.   Respiratory:  Negative for cough.   Cardiovascular:  Negative for chest pain.  Genitourinary:  Negative for flank pain.  Skin:  Positive for rash.  Psychiatric/Behavioral: Negative.      Vital Signs: BP 120/84   Pulse 90   Temp 98.1 F (36.7 C)   Resp 16   Ht 5' (1.524 m)   Wt 242 lb (109.8 kg)   SpO2 99%   BMI 47.26 kg/m    Physical Exam Vitals reviewed.  Constitutional:      Appearance: Normal appearance.  HENT:     Head: Normocephalic and atraumatic.     Nose: Nose normal.     Mouth/Throat:     Mouth: Mucous membranes are moist.     Pharynx: No posterior oropharyngeal erythema.  Eyes:     Extraocular Movements:  Extraocular movements intact.     Pupils: Pupils are equal, round, and reactive to light.  Cardiovascular:     Pulses: Normal pulses.     Heart sounds: Normal heart sounds.  Pulmonary:     Effort: Pulmonary effort is normal.     Breath sounds: Normal breath sounds.  Skin:    Findings: Erythema and rash present.     Comments: Neckline skin fold  Neurological:     General: No focal deficit present.     Mental Status:  She is alert.  Psychiatric:        Mood and Affect: Mood normal.        Behavior: Behavior normal.        Assessment/Plan: 1. Essential hypertension Stable, continue medications as prescribed.  - hydrochlorothiazide (HYDRODIURIL) 12.5 MG tablet; Take 1 tablet (12.5 mg total) by mouth daily.  Dispense: 90 tablet; Refill: 1  2. Sebaceous cyst of right axilla Apply warm compress intermittently and keep the area clean. Monitor for signs of infection and call clinic if changes are seen   3. Seborrheic dermatitis of scalp Use ketoconazole shampoo twice weekly to improve rash on scalp - ketoconazole (NIZORAL) 2 % shampoo; Apply 1 Application topically 2 (two) times a week.  Dispense: 120 mL; Refill: 3   General Counseling: Trine verbalizes understanding of the findings of todays visit and agrees with plan of treatment. I have discussed any further diagnostic evaluation that may be needed or ordered today. We also reviewed her medications today. she has been encouraged to call the office with any questions or concerns that should arise related to todays visit.    No orders of the defined types were placed in this encounter.   Meds ordered this encounter  Medications   hydrochlorothiazide (HYDRODIURIL) 12.5 MG tablet    Sig: Take 1 tablet (12.5 mg total) by mouth daily.    Dispense:  90 tablet    Refill:  1   ketoconazole (NIZORAL) 2 % shampoo    Sig: Apply 1 Application topically 2 (two) times a week.    Dispense:  120 mL    Refill:  3    Return for  previously scheduled, CPE, Denisha Hoel PCP in november.   Total time spent:30 Minutes Time spent includes review of chart, medications, test results, and follow up plan with the patient.   [place] Controlled Substance Database was reviewed by me.  This patient was seen by Sallyanne Kuster, FNP-C in collaboration with Dr. Beverely Risen as a part of collaborative care agreement.   Craige Patel R. Tedd Sias, MSN, FNP-C Internal medicine

## 2023-02-04 ENCOUNTER — Encounter: Payer: Self-pay | Admitting: Nurse Practitioner

## 2023-04-01 ENCOUNTER — Telehealth: Payer: Self-pay | Admitting: Internal Medicine

## 2023-04-01 NOTE — Telephone Encounter (Signed)
03/22/22-03/22/23 MR uploaded to [email or web address] 

## 2023-05-12 ENCOUNTER — Inpatient Hospital Stay (HOSPITAL_BASED_OUTPATIENT_CLINIC_OR_DEPARTMENT_OTHER): Payer: Medicaid Other | Admitting: Oncology

## 2023-05-12 ENCOUNTER — Encounter: Payer: Self-pay | Admitting: Oncology

## 2023-05-12 ENCOUNTER — Other Ambulatory Visit: Payer: Self-pay

## 2023-05-12 ENCOUNTER — Inpatient Hospital Stay: Payer: Medicaid Other | Attending: Oncology

## 2023-05-12 VITALS — BP 132/88 | HR 77 | Temp 99.9°F | Resp 18 | Ht 60.0 in | Wt 243.8 lb

## 2023-05-12 DIAGNOSIS — D560 Alpha thalassemia: Secondary | ICD-10-CM

## 2023-05-12 DIAGNOSIS — D582 Other hemoglobinopathies: Secondary | ICD-10-CM

## 2023-05-12 DIAGNOSIS — R7989 Other specified abnormal findings of blood chemistry: Secondary | ICD-10-CM | POA: Diagnosis not present

## 2023-05-12 DIAGNOSIS — D565 Hemoglobin E-beta thalassemia: Secondary | ICD-10-CM | POA: Insufficient documentation

## 2023-05-12 DIAGNOSIS — D509 Iron deficiency anemia, unspecified: Secondary | ICD-10-CM

## 2023-05-12 DIAGNOSIS — J069 Acute upper respiratory infection, unspecified: Secondary | ICD-10-CM

## 2023-05-12 LAB — IRON AND TIBC
Iron: 114 ug/dL (ref 28–170)
Saturation Ratios: 30 % (ref 10.4–31.8)
TIBC: 386 ug/dL (ref 250–450)
UIBC: 272 ug/dL

## 2023-05-12 LAB — COMPREHENSIVE METABOLIC PANEL
ALT: 108 U/L — ABNORMAL HIGH (ref 0–44)
AST: 54 U/L — ABNORMAL HIGH (ref 15–41)
Albumin: 3.4 g/dL — ABNORMAL LOW (ref 3.5–5.0)
Alkaline Phosphatase: 52 U/L (ref 38–126)
Anion gap: 5 (ref 5–15)
BUN: 10 mg/dL (ref 6–20)
CO2: 23 mmol/L (ref 22–32)
Calcium: 8.5 mg/dL — ABNORMAL LOW (ref 8.9–10.3)
Chloride: 107 mmol/L (ref 98–111)
Creatinine, Ser: 0.46 mg/dL (ref 0.44–1.00)
GFR, Estimated: >60 mL/min (ref >60)
Glucose, Bld: 99 mg/dL (ref 70–99)
Potassium: 4 mmol/L (ref 3.5–5.1)
Sodium: 135 mmol/L (ref 135–145)
Total Bilirubin: 0.9 mg/dL (ref 0.3–1.2)
Total Protein: 6.6 g/dL (ref 6.5–8.1)

## 2023-05-12 LAB — RETICULOCYTES
Immature Retic Fract: 35.4 % — ABNORMAL HIGH (ref 2.3–15.9)
RBC.: 6.15 MIL/uL — ABNORMAL HIGH (ref 3.87–5.11)
Retic Count, Absolute: 105.2 10*3/uL (ref 19.0–186.0)
Retic Ct Pct: 1.7 % (ref 0.4–3.1)

## 2023-05-12 LAB — FERRITIN: Ferritin: 149 ng/mL (ref 11–307)

## 2023-05-12 LAB — LACTATE DEHYDROGENASE: LDH: 147 U/L (ref 98–192)

## 2023-05-12 LAB — CBC
HCT: 33.1 % — ABNORMAL LOW (ref 36.0–46.0)
Hemoglobin: 10.1 g/dL — ABNORMAL LOW (ref 12.0–15.0)
MCH: 16.1 pg — ABNORMAL LOW (ref 26.0–34.0)
MCHC: 30.5 g/dL (ref 30.0–36.0)
MCV: 52.9 fL — ABNORMAL LOW (ref 80.0–100.0)
Platelets: 302 10*3/uL (ref 150–400)
RBC: 6.26 MIL/uL — ABNORMAL HIGH (ref 3.87–5.11)
RDW: 22.3 % — ABNORMAL HIGH (ref 11.5–15.5)
WBC: 8.7 10*3/uL (ref 4.0–10.5)
nRBC: 0.2 % (ref 0.0–0.2)

## 2023-05-12 LAB — TSH: TSH: 2.557 u[IU]/mL (ref 0.350–4.500)

## 2023-05-12 MED ORDER — ALBUTEROL SULFATE HFA 108 (90 BASE) MCG/ACT IN AERS
INHALATION_SPRAY | RESPIRATORY_TRACT | 1 refills | Status: DC
Start: 2023-05-12 — End: 2023-07-06

## 2023-05-12 NOTE — Progress Notes (Signed)
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  MCV was low at 53 with an increased RBC volume of 6.44.  Of note patient has had chronic microcytic anemia with an MCV between 51-54 and hemoglobin between 10-11 at least dating back to 2019.  She states that she has been anemic since childhood.  Possible anemia in her mother and sister as well.  She is not aware of any personal or family history of thalassemia.  She has had cholecystectomy in the past denies any blood loss in her stool or urine.  Her recent iron studies from July 2023 were normal.   Hemoglobin electrophoresis showed elevated hemoglobin A2 at 3.7%.  High-performance liquid chromatography was consistent with hemoglobin E trait.  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  Patient was recommended folic acid supplementation.  Baseline bone density scan normal.  She has not required any periodic blood transfusions.  Interval history-patient is doing well and denies any specific complaints at this time.  She has been compliant with folic acid supplements daily.  She does not take any over-the-counter iron supplements  ECOG PS- 0 Pain scale-  0  Review of systems- Review of Systems  Constitutional:  Negative for chills, fever, malaise/fatigue and weight loss.  HENT:  Negative for congestion, ear discharge and nosebleeds.   Eyes:  Negative for blurred vision.  Respiratory:  Negative for cough, hemoptysis, sputum production, shortness of breath and wheezing.   Cardiovascular:  Negative for chest pain, palpitations, orthopnea and claudication.  Gastrointestinal:  Negative for abdominal pain, blood in stool, constipation, diarrhea, heartburn, melena, nausea and vomiting.  Genitourinary:  Negative for dysuria, flank pain, frequency, hematuria and urgency.  Musculoskeletal:  Negative for back pain, joint pain and myalgias.  Skin:  Negative for rash.  Neurological:  Negative for dizziness, tingling, focal weakness, seizures, weakness and headaches.  Endo/Heme/Allergies:  Does not bruise/bleed easily.  Psychiatric/Behavioral:  Negative for depression and suicidal ideas. The patient does not have insomnia.       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HCC)    Hyperlipidemia    Hypertension    Migraine    Morbid obesity (HCC)    Sleep apnea    uses CPAP   Stroke [facility])      Past Surgical History:  Procedure Laterality Date   CHOLECYSTECTOMY     TEE WITHOUT CARDIOVERSION N/A 11/11/2017   Procedure: TRANSESOPHAGEAL ECHOCARDIOGRAM (TEE);  Surgeon: Dalia Heading, MD;  Location: [facility] ORS;  Service: Cardiovascular;  Laterality: N/A;   TONSILLECTOMY AND ADENOIDECTOMY Bilateral 11/06/2021   Procedure: TONSILLECTOMY;  Surgeon: Geanie Logan, MD;  Location: [facility] ORS;  Service: ENT;  Laterality: Bilateral;   TUBAL LIGATION  WISDOM TOOTH EXTRACTION      Social History   Socioeconomic History   Marital status: Single    Spouse name: Not on file   Number of children: 2   Years of education: Not on file   Highest education level: High school graduate  Occupational  History   Not on file  Tobacco Use   Smoking status: Former    Current packs/day: 0.00    Types: Cigarettes    Quit date: 11/09/2017    Years since quitting: 5.5   Smokeless tobacco: Never  Vaping Use   Vaping status: Former  Substance and Sexual Activity   Alcohol use: Yes    Comment: every other weekend, not much    Drug use: No   Sexual activity: Yes    Birth control/protection: Condom  Other Topics Concern   Not on file  Social History Narrative   Lives at home with her parents, brother and her children   Right handed   Drinks rare caffeine    Social Determinants of Health   Financial Resource Strain: Low Risk  (11/09/2017)   Overall Financial Resource Strain (CARDIA)    Difficulty of Paying Living Expenses: Not hard at all  Food Insecurity: No Food Insecurity (11/09/2017)   Hunger Vital Sign    Worried About Running Out of Food in the Last Year: Never true    Ran Out of Food in the Last Year: Never true  Transportation Needs: No Transportation Needs (01/06/2018)   PRAPARE - Administrator, Civil Service (Medical): No    Lack of Transportation (Non-Medical): No  Recent Concern: Transportation Needs - Unmet Transportation Needs (11/09/2017)   PRAPARE - Transportation    Lack of Transportation (Medical): Yes    Lack of Transportation (Non-Medical): Yes  Physical Activity: Sufficiently Active (11/09/2017)   Exercise Vital Sign    Days of Exercise per Week: 3 days    Minutes of Exercise per Session: 50 min  Stress: No Stress Concern Present (11/09/2017)   [employer] of Occupational Health - Occupational Stress Questionnaire    Feeling of Stress : Not at all  Social Connections: Unknown (11/09/2017)   Social Connection and Isolation Panel [NHANES]    Frequency of Communication with Friends and Family: More than three times a week    Frequency of Social Gatherings with Friends and Family: Once a week    Attends Religious Services: Never    Doctor, general practice or Organizations: No    Attends Engineer, structural: Never    Marital Status: Not on file  Intimate Partner Violence: Not on file    Family History  Problem Relation Age of Onset   Kidney failure Father    Heart attack Father        open heart surgery   Hypertension Father    Diabetes Father    Gout Father    Cataracts Father      Current Outpatient Medications:    albuterol (PROAIR HFA) 108 (90 Base) MCG/ACT inhaler, TAKE 2 PUFFS BY MOUTH EVERY 6 HOURS AS NEEDED FOR WHEEZE OR SHORTNESS OF BREATH, Disp: 8.5 each, Rfl: 1   cetirizine (ZYRTEC) 10 MG tablet, Take 10 mg by mouth daily as needed for allergies. , Disp: , Rfl:    famotidine (PEPCID) 20 MG tablet, Take 1 tablet (20 mg total) by mouth 2 (two) times daily., Disp: 180 tablet, Rfl: 1   folic acid (FOLVITE) 1 MG tablet, TAKE 1 TABLET BY  MOUTH EVERY DAY, Disp: 90 tablet, Rfl: 1   hydrochlorothiazide (HYDRODIURIL) 12.5 MG tablet, Take 1 tablet (12.5 mg total) by mouth daily., Disp: 90 tablet, Rfl: 1   ketoconazole (NIZORAL) 2 % shampoo, Apply 1 Application topically 2 (two) times a week., Disp: 120 mL, Rfl: 3   rosuvastatin (CRESTOR) 5 MG tablet, Take one tab po 3 x a week, Disp: 24 tablet, Rfl: 1   augmented betamethasone dipropionate (DIPROLENE) 0.05 % ointment, Apply topically 2 (two) times daily. (Patient not taking: Reported on 05/12/2023), Disp: 30 g, Rfl: 0   Vitamin D, Ergocalciferol, (DRISDOL) 1.25 MG (50000 UNIT) CAPS capsule, Take 1 capsule (50,000 Units total) by mouth every 7 (seven) days. (Patient not taking: Reported on 05/12/2023), Disp: 12 capsule, Rfl: 1  Physical exam:  Vitals:   05/12/23 1517  BP: 132/88  Pulse: 77  Resp: 18  Temp: 99.9 F (37.7 C)  TempSrc: Tympanic  SpO2: 98%  Weight: 243 lb 12.8 oz (110.6 kg)  Height: 5' (1.524 m)   Physical Exam Constitutional:      General: She is not in acute distress.    Appearance: She is obese.  Cardiovascular:     Rate and Rhythm: Normal  rate and regular rhythm.     Heart sounds: Normal heart sounds.  Pulmonary:     Effort: Pulmonary effort is normal.     Breath sounds: Normal breath sounds.  Abdominal:     General: Bowel sounds are normal.     Palpations: Abdomen is soft.  Skin:    General: Skin is warm and dry.  Neurological:     Mental Status: She is alert and oriented to person, place, and time.         Latest Ref Rng & Units 11/11/2022    2:53 PM  CMP  Glucose 70 - 99 mg/dL 409   BUN 6 - 20 mg/dL 9   Creatinine 8.11 - 9.14 mg/dL 7.82   Sodium 956 - 213 mmol/L 138   Potassium 3.5 - 5.1 mmol/L 3.3   Chloride 98 - 111 mmol/L 104   CO2 22 - 32 mmol/L 25   Calcium 8.9 - 10.3 mg/dL 8.9   Total Protein 6.5 - 8.1 g/dL 6.9   Total Bilirubin 0.3 - 1.2 mg/dL 0.6   Alkaline Phos 38 - 126 U/L 60   AST 15 - 41 U/L 42   ALT 0 - 44 U/L 77       Latest Ref Rng & Units 11/11/2022    2:53 PM  CBC  WBC 4.0 - 10.5 K/uL 7.9   Hemoglobin 12.0 - 15.0 g/dL 08.6   Hematocrit 57.8 - 46.0 % 34.0   Platelets 150 - 400 K/uL 329     Assessment and plan- Patient is a 42 y.o. 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  She has moderate anemia butHemoglobin has remained stable around 10 over the years.  Iron studies today are not indicative of iron overload or iron deficiency.  Again explained to the patient that her chronic microcytic anemia is due to alpha thalassemia and not due to iron deficiency and therefore patient should not take unnecessary over-the-counter iron supplements.  She should continue with folic acid supplementation on a regular basis.  Her baseline bone density scan is normal.  I have recommended that she should get her children tested for thalassemia as well.  Patient has chronic mild elevation  of AST and ALT which I suspect is secondary to fatty liver in the setting of being overweight.  This is not secondary to iron  overload from thalassemia.  I have asked her to discuss this further with her PCP and potential referral to GI.  She needs to focus on her weight loss.  CBC with differential, CMP, ferritin and haptoglobin in 6 months and I will see her thereafter   Visit Diagnosis 1. Hemoglobin H disease (HCC)   2. Hemoglobin E trait (HCC)   3. Abnormal LFTs      Dr. Owens Shark, MD, MPH [facility] [phone] 05/12/2023 3:23 PM

## 2023-05-13 LAB — HAPTOGLOBIN: Haptoglobin: 81 mg/dL (ref 42–296)

## 2023-06-29 ENCOUNTER — Encounter: Payer: Self-pay | Admitting: Physician Assistant

## 2023-06-29 ENCOUNTER — Ambulatory Visit: Payer: Medicaid Other | Admitting: Physician Assistant

## 2023-06-29 VITALS — BP 110/90 | HR 93 | Temp 97.8°F | Resp 16 | Ht 60.0 in | Wt 237.6 lb

## 2023-06-29 DIAGNOSIS — M26629 Arthralgia of temporomandibular joint, unspecified side: Secondary | ICD-10-CM | POA: Diagnosis not present

## 2023-06-29 NOTE — Progress Notes (Signed)
[facility] [address] [place], [place] [address]  Internal MEDICINE  Office Visit Note  Patient Name: Natasha Moreno  [id number]  [id number]  Date of Service: 07/07/2023  Chief Complaint  Patient presents with   Acute Visit   Facial Pain    Left side facial pain/swelling, pain is specifically in her jaw and neck     HPI Pt is here for a sick visit. -Left side jaw pain started Saturday, feels a little swollen. Hurts to open wide and to chew. Some clicking -no fevers or chills -has dentist appt tomorrow and may discuss possible night guard due to likely TMJ. Patient does clench and grind teeth at night and is likely contributing -No ear pain or tooth pain, no fever or chills. Denies SOB or swallowing difficulty  Current Medication:  Outpatient Encounter Medications as of 06/29/2023  Medication Sig   augmented betamethasone dipropionate (DIPROLENE) 0.05 % ointment Apply topically 2 (two) times daily.   cetirizine (ZYRTEC) 10 MG tablet Take 10 mg by mouth daily as needed for allergies.    famotidine (PEPCID) 20 MG tablet Take 1 tablet (20 mg total) by mouth 2 (two) times daily.   folic acid (FOLVITE) 1 MG tablet TAKE 1 TABLET BY MOUTH EVERY DAY   hydrochlorothiazide (HYDRODIURIL) 12.5 MG tablet Take 1 tablet (12.5 mg total) by mouth daily.   ketoconazole (NIZORAL) 2 % shampoo Apply 1 Application topically 2 (two) times a week.   rosuvastatin (CRESTOR) 5 MG tablet Take one tab po 3 x a week   Vitamin D, Ergocalciferol, (DRISDOL) 1.25 MG (50000 UNIT) CAPS capsule Take 1 capsule (50,000 Units total) by mouth every 7 (seven) days.   [DISCONTINUED] albuterol (PROAIR HFA) 108 (90 Base) MCG/ACT inhaler TAKE 2 PUFFS BY MOUTH EVERY 6 HOURS AS NEEDED FOR WHEEZE OR SHORTNESS OF BREATH   No facility-administered encounter medications on file as of 06/29/2023.      Medical History: Past Medical History:  Diagnosis Date   Anemia    Hemoglobin E-A disorder (HCC)     Hyperlipidemia    Hypertension    Migraine    Morbid obesity (HCC)    Sleep apnea    uses CPAP   Stroke (HCC)      Vital Signs: BP (!) 110/90   Pulse 93   Temp 97.8 F (36.6 C)   Resp 16   Ht 5' (1.524 m)   Wt 237 lb 9.6 oz (107.8 kg)   SpO2 98%   BMI 46.40 kg/m    Review of Systems  Constitutional:  Negative for chills, fatigue and fever.  HENT:  Negative for congestion, dental problem, mouth sores, postnasal drip and trouble swallowing.        Jaw pain  Respiratory:  Negative for cough and shortness of breath.   Cardiovascular:  Negative for chest pain.  Genitourinary:  Negative for flank pain.  Psychiatric/Behavioral: Negative.      Physical Exam Vitals and nursing note reviewed.  Constitutional:      Appearance: Normal appearance.  HENT:     Head: Normocephalic and atraumatic.     Right Ear: Tympanic membrane normal.     Left Ear: Tympanic membrane normal.     Mouth/Throat:     Pharynx: No oropharyngeal exudate or posterior oropharyngeal erythema.     Comments: Clicking on left side TMJ upon opening mouth and moving lower jaw side to side. Pain with movement of jaw upon opening/movement Eyes:     Pupils: Pupils  are equal, round, and reactive to light.  Cardiovascular:     Rate and Rhythm: Normal rate and regular rhythm.  Pulmonary:     Effort: Pulmonary effort is normal.     Breath sounds: Normal breath sounds.  Skin:    General: Skin is warm and dry.  Neurological:     General: No focal deficit present.     Mental Status: She is alert.  Psychiatric:        Mood and Affect: Mood normal.        Behavior: Behavior normal.       Assessment/Plan: 1. TMJ syndrome Discussed avoiding crunchy/tough foods and try soft food diet to limiting chewing. May use NSAIDs as needed. Will also look into mouth guard as patient clenches and grinds teeth, especially at night. Pt will be seeing dentist this week and may discuss guard further    General Counseling:  murrell elizondo understanding of the findings of todays visit and agrees with plan of treatment. I have discussed any further diagnostic evaluation that may be needed or ordered today. We also reviewed her medications today. she has been encouraged to call the office with any questions or concerns that should arise related to todays visit.    Counseling:    No orders of the defined types were placed in this encounter.   No orders of the defined types were placed in this encounter.   Time spent:30 Minutes

## 2023-07-06 ENCOUNTER — Other Ambulatory Visit: Payer: Self-pay | Admitting: Nurse Practitioner

## 2023-07-06 DIAGNOSIS — J069 Acute upper respiratory infection, unspecified: Secondary | ICD-10-CM

## 2023-07-07 ENCOUNTER — Ambulatory Visit: Payer: Medicaid Other | Admitting: Internal Medicine

## 2023-07-28 ENCOUNTER — Telehealth: Payer: Self-pay | Admitting: Nurse Practitioner

## 2023-07-28 NOTE — Telephone Encounter (Signed)
Left vm and sent mychart message to confirm 08/04/23 appointment-Toni

## 2023-08-04 ENCOUNTER — Encounter: Payer: Self-pay | Admitting: Nurse Practitioner

## 2023-08-04 ENCOUNTER — Ambulatory Visit (INDEPENDENT_AMBULATORY_CARE_PROVIDER_SITE_OTHER): Payer: Medicaid Other | Admitting: Nurse Practitioner

## 2023-08-04 VITALS — BP 134/88 | HR 99 | Temp 98.5°F | Resp 16 | Ht 60.0 in | Wt 242.0 lb

## 2023-08-04 DIAGNOSIS — Z0001 Encounter for general adult medical examination with abnormal findings: Secondary | ICD-10-CM | POA: Diagnosis not present

## 2023-08-04 DIAGNOSIS — E782 Mixed hyperlipidemia: Secondary | ICD-10-CM

## 2023-08-04 DIAGNOSIS — K760 Fatty (change of) liver, not elsewhere classified: Secondary | ICD-10-CM

## 2023-08-04 DIAGNOSIS — J4520 Mild intermittent asthma, uncomplicated: Secondary | ICD-10-CM

## 2023-08-04 DIAGNOSIS — Z8673 Personal history of transient ischemic attack (TIA), and cerebral infarction without residual deficits: Secondary | ICD-10-CM

## 2023-08-04 DIAGNOSIS — I10 Essential (primary) hypertension: Secondary | ICD-10-CM

## 2023-08-04 MED ORDER — HYDROCHLOROTHIAZIDE 12.5 MG PO TABS
12.5000 mg | ORAL_TABLET | Freq: Every day | ORAL | 1 refills | Status: DC
Start: 2023-08-04 — End: 2024-05-24

## 2023-08-04 MED ORDER — ROSUVASTATIN CALCIUM 5 MG PO TABS
ORAL_TABLET | ORAL | 1 refills | Status: DC
Start: 2023-08-04 — End: 2024-05-16

## 2023-08-04 NOTE — Progress Notes (Signed)
[facility] [address] [place], [place] [address]  Internal MEDICINE  Office Visit Note  Patient Name: Natasha Moreno  [id number]  [id number]  Date of Service: 08/04/2023  Chief Complaint  Patient presents with   Hyperlipidemia   Hypertension   Annual Exam    HPI Natasha Moreno presents for an annual well visit and physical exam.  Well-appearing 42 y.o. female with OSA, hypertension, fatty liver, hepatomegaly and history of stroke.  Routine mammogram: done in march this year, due in march 2025 Pap smear: due in 2025 -- gets done at [facility] with pcp Labs: labs are up to date except still overdue to check cholesterol levels and vitamin D level New or worsening pain: none Other concerns: none Sees Dr. Smith Robert for hematology Has fatty liver diagnosis and elevated liver enzymes -- Due for RUQ ultrasound, last ultrasound was done in January 2023.  Bone density check last year was normal.    Current Medication: Outpatient Encounter Medications as of 08/04/2023  Medication Sig   albuterol (VENTOLIN HFA) 108 (90 Base) MCG/ACT inhaler TAKE 2 PUFFS BY MOUTH EVERY 6 HOURS AS NEEDED FOR WHEEZE OR SHORTNESS OF BREATH   augmented betamethasone dipropionate (DIPROLENE) 0.05 % ointment Apply topically 2 (two) times daily.   cetirizine (ZYRTEC) 10 MG tablet Take 10 mg by mouth daily as needed for allergies.    famotidine (PEPCID) 20 MG tablet Take 1 tablet (20 mg total) by mouth 2 (two) times daily.   folic acid (FOLVITE) 1 MG tablet TAKE 1 TABLET BY MOUTH EVERY DAY   ketoconazole (NIZORAL) 2 % shampoo Apply 1 Application topically 2 (two) times a week.   Vitamin D, Ergocalciferol, (DRISDOL) 1.25 MG (50000 UNIT) CAPS capsule Take 1 capsule (50,000 Units total) by mouth every 7 (seven) days.   [DISCONTINUED] hydrochlorothiazide (HYDRODIURIL) 12.5 MG tablet Take 1 tablet (12.5 mg total) by mouth daily.   [DISCONTINUED] rosuvastatin (CRESTOR) 5 MG tablet Take one tab po 3 x a week    hydrochlorothiazide (HYDRODIURIL) 12.5 MG tablet Take 1 tablet (12.5 mg total) by mouth daily.   rosuvastatin (CRESTOR) 5 MG tablet Take one tab po 3 x a week   No facility-administered encounter medications on file as of 08/04/2023.    Surgical History: Past Surgical History:  Procedure Laterality Date   CHOLECYSTECTOMY     TEE WITHOUT CARDIOVERSION N/A 11/11/2017   Procedure: TRANSESOPHAGEAL ECHOCARDIOGRAM (TEE);  Surgeon: Dalia Heading, MD;  Location: [facility] ORS;  Service: Cardiovascular;  Laterality: N/A;   TONSILLECTOMY AND ADENOIDECTOMY Bilateral 11/06/2021   Procedure: TONSILLECTOMY;  Surgeon: Geanie Logan, MD;  Location: [facility] ORS;  Service: ENT;  Laterality: Bilateral;   TUBAL LIGATION     WISDOM TOOTH EXTRACTION      Medical History: Past Medical History:  Diagnosis Date   Anemia    Hemoglobin E-A disorder (HCC)    Hyperlipidemia    Hypertension    Migraine    Morbid obesity (HCC)    Sleep apnea    uses CPAP   Stroke [facility]     Family History: Family History  Problem Relation Age of Onset   Kidney failure Father    Heart attack Father        open heart surgery   Hypertension Father    Diabetes Father    Gout Father    Cataracts Father     Social History   Socioeconomic History   Marital status: Single    Spouse name: Not on file  Number of children: 2   Years of education: Not on file   Highest education level: High school graduate  Occupational History   Not on file  Tobacco Use   Smoking status: Former    Current packs/day: 0.00    Types: Cigarettes    Quit date: 11/09/2017    Years since quitting: 5.7   Smokeless tobacco: Never  Vaping Use   Vaping status: Former  Substance and Sexual Activity   Alcohol use: Yes    Comment: every other weekend, not much    Drug use: No   Sexual activity: Yes    Birth control/protection: Condom  Other Topics Concern   Not on file  Social History Narrative   Lives at home with her parents, brother and  her children   Right handed   Drinks rare caffeine    Social Determinants of Health   Financial Resource Strain: Low Risk  (11/09/2017)   Overall Financial Resource Strain (CARDIA)    Difficulty of Paying Living Expenses: Not hard at all  Food Insecurity: No Food Insecurity (11/09/2017)   Hunger Vital Sign    Worried About Running Out of Food in the Last Year: Never true    Ran Out of Food in the Last Year: Never true  Transportation Needs: No Transportation Needs (01/06/2018)   PRAPARE - Administrator, Civil Service (Medical): No    Lack of Transportation (Non-Medical): No  Recent Concern: Transportation Needs - Unmet Transportation Needs (11/09/2017)   PRAPARE - Transportation    Lack of Transportation (Medical): Yes    Lack of Transportation (Non-Medical): Yes  Physical Activity: Sufficiently Active (11/09/2017)   Exercise Vital Sign    Days of Exercise per Week: 3 days    Minutes of Exercise per Session: 50 min  Stress: No Stress Concern Present (11/09/2017)   [employer] of Occupational Health - Occupational Stress Questionnaire    Feeling of Stress : Not at all  Social Connections: Unknown (11/09/2017)   Social Connection and Isolation Panel [NHANES]    Frequency of Communication with Friends and Family: More than three times a week    Frequency of Social Gatherings with Friends and Family: Once a week    Attends Religious Services: Never    Database administrator or Organizations: No    Attends Engineer, structural: Never    Marital Status: Not on file  Intimate Partner Violence: Not on file      Review of Systems  Constitutional:  Negative for activity change, appetite change, chills, fatigue, fever and unexpected weight change.  HENT: Negative.  Negative for congestion, ear pain, rhinorrhea, sore throat and trouble swallowing.   Eyes: Negative.   Respiratory: Negative.  Negative for cough, chest tightness, shortness of breath and wheezing.    Cardiovascular: Negative.  Negative for chest pain.  Gastrointestinal: Negative.  Negative for abdominal pain, blood in stool, constipation, diarrhea, nausea and vomiting.  Endocrine: Negative.   Genitourinary: Negative.  Negative for difficulty urinating, dysuria, frequency, hematuria and urgency.  Musculoskeletal: Negative.  Negative for arthralgias, back pain, joint swelling, myalgias and neck pain.  Skin: Negative.  Negative for rash and wound.  Allergic/Immunologic: Negative.  Negative for immunocompromised state.  Neurological: Negative.  Negative for dizziness, seizures, numbness and headaches.  Hematological: Negative.   Psychiatric/Behavioral: Negative.  Negative for behavioral problems, self-injury and suicidal ideas. The patient is not nervous/anxious.     Vital Signs: BP 134/88   Pulse 99  Temp 98.5 F (36.9 C)   Resp 16   Ht 5' (1.524 m)   Wt 242 lb (109.8 kg)   SpO2 96%   BMI 47.26 kg/m    Physical Exam Vitals reviewed.  Constitutional:      General: She is awake. She is not in acute distress.    Appearance: Normal appearance. She is well-developed and well-groomed. She is obese. She is not ill-appearing or diaphoretic.  HENT:     Head: Normocephalic and atraumatic.     Right Ear: Tympanic membrane, ear canal and external ear normal.     Left Ear: Tympanic membrane, ear canal and external ear normal.     Nose: Nose normal. No congestion or rhinorrhea.     Mouth/Throat:     Lips: Pink.     Mouth: Mucous membranes are moist.     Pharynx: Oropharynx is clear. Uvula midline. No oropharyngeal exudate or posterior oropharyngeal erythema.  Eyes:     General: Lids are normal. Vision grossly intact. Gaze aligned appropriately. No scleral icterus.       Right eye: No discharge.        Left eye: No discharge.     Extraocular Movements: Extraocular movements intact.     Conjunctiva/sclera: Conjunctivae normal.     Pupils: Pupils are equal, round, and reactive to  light.     Funduscopic exam:    Right eye: Red reflex present.        Left eye: Red reflex present. Neck:     Thyroid: No thyromegaly.     Vascular: No carotid bruit or JVD.     Trachea: Trachea and phonation normal. No tracheal deviation.  Cardiovascular:     Rate and Rhythm: Normal rate and regular rhythm.     Pulses: Normal pulses.     Heart sounds: Normal heart sounds, S1 normal and S2 normal. No murmur heard.    No friction rub. No gallop.  Pulmonary:     Effort: Pulmonary effort is normal. No accessory muscle usage or respiratory distress.     Breath sounds: Normal breath sounds and air entry. No stridor. No wheezing or rales.  Chest:     Chest wall: No tenderness.     Comments: Declined clinical breast exam, will get first annual screening mammogram next year.  Abdominal:     General: Bowel sounds are normal. There is no distension.     Palpations: Abdomen is soft. There is no shifting dullness, fluid wave, mass or pulsatile mass.     Tenderness: There is no abdominal tenderness. There is no guarding or rebound.     Hernia: There is no hernia in the left inguinal area or right inguinal area.  Genitourinary:    General: Normal vulva.     Exam position: Lithotomy position.     Labia:        Right: No rash, tenderness, lesion or injury.        Left: No rash, tenderness, lesion or injury.      Urethra: No prolapse, urethral swelling or urethral lesion.     Vagina: Normal. No signs of injury and foreign body. No vaginal discharge, erythema, tenderness, bleeding, lesions or prolapsed vaginal walls.     Cervix: No cervical motion tenderness, discharge, friability, lesion, erythema, cervical bleeding or eversion.     Uterus: Normal. Not deviated, not fixed and no uterine prolapse.      Adnexa: Right adnexa normal and left adnexa normal.     Rectum: No  tenderness, anal fissure or external hemorrhoid. Normal anal tone.  Musculoskeletal:        General: No tenderness or deformity.  Normal range of motion.     Cervical back: Normal range of motion and neck supple.     Right lower leg: No edema.     Left lower leg: No edema.  Lymphadenopathy:     Cervical: No cervical adenopathy.     Lower Body: No right inguinal adenopathy. No left inguinal adenopathy.  Skin:    General: Skin is warm and dry.     Capillary Refill: Capillary refill takes less than 2 seconds.     Coloration: Skin is not pale.     Findings: No erythema or rash.  Neurological:     Mental Status: She is alert and oriented to person, place, and time.     Cranial Nerves: No cranial nerve deficit.     Motor: No abnormal muscle tone.     Coordination: Coordination normal.     Gait: Gait normal.     Deep Tendon Reflexes: Reflexes are normal and symmetric.  Psychiatric:        Mood and Affect: Mood and affect normal.        Behavior: Behavior normal. Behavior is cooperative.        Thought Content: Thought content normal.        Judgment: Judgment normal.        Assessment/Plan: 1. Encounter for routine adult health examination with abnormal findings Age-appropriate preventive screenings and vaccinations discussed, annual physical exam completed. Routine labs for health maintenance will be ordered for vitamin D and lipid panel. All other labs are up to date. PHM updated.   2. Essential hypertension Stable, continue hydrochlorothiazide as prescribed  - hydrochlorothiazide (HYDRODIURIL) 12.5 MG tablet; Take 1 tablet (12.5 mg total) by mouth daily.  Dispense: 90 tablet; Refill: 1  3. Mixed hyperlipidemia Continue rosuvastatin as prescribed.  - rosuvastatin (CRESTOR) 5 MG tablet; Take one tab po 3 x a week  Dispense: 24 tablet; Refill: 1  4. Mild intermittent asthma without complication Has albuterol inhaler as needed   5. Fatty liver Ultrasound of RUQ ordered to reevaluate liver - US Abdomen Limited RUQ (LIVER/GB); Future  6. History of stroke On statin therapy       General Counseling:  Rechelle verbalizes understanding of the findings of todays visit and agrees with plan of treatment. I have discussed any further diagnostic evaluation that may be needed or ordered today. We also reviewed her medications today. she has been encouraged to call the office with any questions or concerns that should arise related to todays visit.    Orders Placed This Encounter  Procedures   US Abdomen Limited RUQ (LIVER/GB)    Meds ordered this encounter  Medications   rosuvastatin (CRESTOR) 5 MG tablet    Sig: Take one tab po 3 x a week    Dispense:  24 tablet    Refill:  1   hydrochlorothiazide (HYDRODIURIL) 12.5 MG tablet    Sig: Take 1 tablet (12.5 mg total) by mouth daily.    Dispense:  90 tablet    Refill:  1    Return in about 4 weeks (around 09/01/2023) for F/U, Review labs/test, Jrue Yambao PCP -- lipid panel and RUQ [place].   Total time spent:30 Minutes Time spent includes review of chart, medications, test results, and follow up plan with the patient.   [place] Controlled Substance Database was reviewed by me.  This  patient was seen by Sallyanne Kuster, FNP-C in collaboration with Dr. Beverely Risen as a part of collaborative care agreement.  Gerber Penza R. Tedd Sias, MSN, FNP-C Internal medicine

## 2023-08-05 ENCOUNTER — Other Ambulatory Visit: Payer: Self-pay | Admitting: Nurse Practitioner

## 2023-08-05 ENCOUNTER — Inpatient Hospital Stay
Admission: RE | Admit: 2023-08-05 | Discharge: 2023-08-05 | Discharge status: Home / Self Care | Payer: Medicaid Other | Source: Ambulatory Visit | Attending: Nurse Practitioner

## 2023-08-05 DIAGNOSIS — K760 Fatty (change of) liver, not elsewhere classified: Secondary | ICD-10-CM

## 2023-08-06 LAB — LIPID PANEL
Chol/HDL Ratio: 2.3 ratio (ref 0.0–4.4)
Cholesterol, Total: 151 mg/dL (ref 100–199)
HDL: 67 mg/dL (ref >39)
LDL Chol Calc (NIH): 62 mg/dL (ref 0–99)
Triglycerides: 128 mg/dL (ref 0–149)
VLDL Cholesterol Cal: 22 mg/dL (ref 5–40)

## 2023-08-06 LAB — VITAMIN D 25 HYDROXY (VIT D DEFICIENCY, FRACTURES): Vit D, 25-Hydroxy: 19.5 ng/mL — ABNORMAL LOW (ref 30.0–100.0)

## 2023-08-06 NOTE — Progress Notes (Signed)
Will discuss result at upcoming appt on 12/10

## 2023-08-06 NOTE — Progress Notes (Signed)
Will discuss lab results at upcoming office visit on 12/10

## 2023-08-18 ENCOUNTER — Ambulatory Visit: Payer: Medicaid Other | Admitting: Internal Medicine

## 2023-09-01 ENCOUNTER — Ambulatory Visit: Payer: Medicaid Other | Admitting: Nurse Practitioner

## 2023-09-02 ENCOUNTER — Encounter: Payer: Self-pay | Admitting: Nurse Practitioner

## 2023-09-02 ENCOUNTER — Ambulatory Visit: Payer: Medicaid Other | Admitting: Nurse Practitioner

## 2023-09-02 ENCOUNTER — Other Ambulatory Visit: Payer: Self-pay | Admitting: Nurse Practitioner

## 2023-09-02 VITALS — BP 136/84 | HR 100 | Temp 98.5°F | Resp 16 | Ht 60.0 in | Wt 245.6 lb

## 2023-09-02 DIAGNOSIS — E66813 Obesity, class 3: Secondary | ICD-10-CM | POA: Insufficient documentation

## 2023-09-02 DIAGNOSIS — K760 Fatty (change of) liver, not elsewhere classified: Secondary | ICD-10-CM | POA: Diagnosis not present

## 2023-09-02 DIAGNOSIS — E559 Vitamin D deficiency, unspecified: Secondary | ICD-10-CM | POA: Insufficient documentation

## 2023-09-02 DIAGNOSIS — Z6841 Body Mass Index (BMI) 40.0 and over, adult: Secondary | ICD-10-CM | POA: Diagnosis not present

## 2023-09-02 MED ORDER — VITAMIN D (ERGOCALCIFEROL) 1.25 MG (50000 UNIT) PO CAPS: 50000.0000 [IU] | ORAL_CAPSULE | ORAL | 1 refills | Status: DC

## 2023-09-02 MED ORDER — TIRZEPATIDE-WEIGHT MANAGEMENT 5 MG/0.5ML ~~LOC~~ SOAJ: 5.0000 mg | SUBCUTANEOUS | 5 refills | Status: DC

## 2023-09-02 NOTE — Telephone Encounter (Signed)
Please review

## 2023-09-02 NOTE — Progress Notes (Signed)
[facility] [address] [place], [place] [address]  Internal MEDICINE  Office Visit Note  Patient Name: Natasha Moreno  [id number]  [id number]  Date of Service: 09/02/2023  Chief Complaint  Patient presents with   Hyperlipidemia   Hypertension   Follow-up    Review labs and u/s    HPI Telly presents for a follow-up visit for lab results, liver ultrasound and obesity Liver ultrasound is stable, still shows fatty liver with no progression to steatohepatitis or fibrosis.  Low vitamin D level, was previously taking a weekly prescription supplement. Cholesterol panel continues to be in normal range. On statin therapy  Obesity -- has tried wegovy in the past but had issues with severe acid reflux and heartburn. Has also tried and failed phentermine and ozempic in the past. Has tried multiple types of diets and exercise programs as well as weight watchers.   Current Medication: Outpatient Encounter Medications as of 09/02/2023  Medication Sig   albuterol (VENTOLIN HFA) 108 (90 Base) MCG/ACT inhaler TAKE 2 PUFFS BY MOUTH EVERY 6 HOURS AS NEEDED FOR WHEEZE OR SHORTNESS OF BREATH   augmented betamethasone dipropionate (DIPROLENE) 0.05 % ointment Apply topically 2 (two) times daily.   cetirizine (ZYRTEC) 10 MG tablet Take 10 mg by mouth daily as needed for allergies.    famotidine (PEPCID) 20 MG tablet Take 1 tablet (20 mg total) by mouth 2 (two) times daily.   folic acid (FOLVITE) 1 MG tablet TAKE 1 TABLET BY MOUTH EVERY DAY   hydrochlorothiazide (HYDRODIURIL) 12.5 MG tablet Take 1 tablet (12.5 mg total) by mouth daily.   ketoconazole (NIZORAL) 2 % shampoo Apply 1 Application topically 2 (two) times a week.   rosuvastatin (CRESTOR) 5 MG tablet Take one tab po 3 x a week   tirzepatide (ZEPBOUND) 5 MG/0.5ML Pen Inject 5 mg into the skin once a week.   [DISCONTINUED] Vitamin D, Ergocalciferol, (DRISDOL) 1.25 MG (50000 UNIT) CAPS capsule Take 1 capsule (50,000 Units total)  by mouth every 7 (seven) days.   Vitamin D, Ergocalciferol, (DRISDOL) 1.25 MG (50000 UNIT) CAPS capsule Take 1 capsule (50,000 Units total) by mouth every 7 (seven) days.   No facility-administered encounter medications on file as of 09/02/2023.    Surgical History: Past Surgical History:  Procedure Laterality Date   CHOLECYSTECTOMY     TEE WITHOUT CARDIOVERSION N/A 11/11/2017   Procedure: TRANSESOPHAGEAL ECHOCARDIOGRAM (TEE);  Surgeon: Dalia Heading, MD;  Location: [facility] ORS;  Service: Cardiovascular;  Laterality: N/A;   TONSILLECTOMY AND ADENOIDECTOMY Bilateral 11/06/2021   Procedure: TONSILLECTOMY;  Surgeon: Geanie Logan, MD;  Location: [facility] ORS;  Service: ENT;  Laterality: Bilateral;   TUBAL LIGATION     WISDOM TOOTH EXTRACTION      Medical History: Past Medical History:  Diagnosis Date   Anemia    Hemoglobin E-A disorder (HCC)    Hyperlipidemia    Hypertension    Migraine    Morbid obesity (HCC)    Sleep apnea    uses CPAP   Stroke [facility])     Family History: Family History  Problem Relation Age of Onset   Kidney failure Father    Heart attack Father        open heart surgery   Hypertension Father    Diabetes Father    Gout Father    Cataracts Father     Social History   Socioeconomic History   Marital status: Single    Spouse name: Not on file  Number of children: 2   Years of education: Not on file   Highest education level: High school graduate  Occupational History   Not on file  Tobacco Use   Smoking status: Former    Current packs/day: 0.00    Types: Cigarettes    Quit date: 11/09/2017    Years since quitting: 5.8   Smokeless tobacco: Never  Vaping Use   Vaping status: Former  Substance and Sexual Activity   Alcohol use: Yes    Comment: every other weekend, not much    Drug use: No   Sexual activity: Yes    Birth control/protection: Condom  Other Topics Concern   Not on file  Social History Narrative   Lives at home with her parents,  brother and her children   Right handed   Drinks rare caffeine    Social Determinants of Health   Financial Resource Strain: Low Risk  (11/09/2017)   Overall Financial Resource Strain (CARDIA)    Difficulty of Paying Living Expenses: Not hard at all  Food Insecurity: No Food Insecurity (11/09/2017)   Hunger Vital Sign    Worried About Running Out of Food in the Last Year: Never true    Ran Out of Food in the Last Year: Never true  Transportation Needs: No Transportation Needs (01/06/2018)   PRAPARE - Administrator, Civil Service (Medical): No    Lack of Transportation (Non-Medical): No  Recent Concern: Transportation Needs - Unmet Transportation Needs (11/09/2017)   PRAPARE - Transportation    Lack of Transportation (Medical): Yes    Lack of Transportation (Non-Medical): Yes  Physical Activity: Sufficiently Active (11/09/2017)   Exercise Vital Sign    Days of Exercise per Week: 3 days    Minutes of Exercise per Session: 50 min  Stress: No Stress Concern Present (11/09/2017)   [employer] of Occupational Health - Occupational Stress Questionnaire    Feeling of Stress : Not at all  Social Connections: Unknown (11/09/2017)   Social Connection and Isolation Panel [NHANES]    Frequency of Communication with Friends and Family: More than three times a week    Frequency of Social Gatherings with Friends and Family: Once a week    Attends Religious Services: Never    Database administrator or Organizations: No    Attends Engineer, structural: Never    Marital Status: Not on file  Intimate Partner Violence: Not on file      Review of Systems  Constitutional:  Positive for appetite change and unexpected weight change. Negative for fatigue and fever.  HENT: Negative.  Negative for congestion, mouth sores and postnasal drip.   Respiratory: Negative.  Negative for apnea, cough, chest tightness, shortness of breath and wheezing.   Cardiovascular:  Negative for chest  pain and palpitations.  Genitourinary:  Negative for flank pain.  Psychiatric/Behavioral: Negative.  Negative for self-injury, sleep disturbance and suicidal ideas.     Vital Signs: BP 136/84   Pulse 100   Temp 98.5 F (36.9 C)   Resp 16   Ht 5' (1.524 m)   Wt 245 lb 9.6 oz (111.4 kg)   SpO2 98%   BMI 47.97 kg/m    Physical Exam Vitals reviewed.  Constitutional:      General: She is not in acute distress.    Appearance: Normal appearance. She is obese. She is not ill-appearing.  HENT:     Head: Normocephalic and atraumatic.  Eyes:  Pupils: Pupils are equal, round, and reactive to light.  Cardiovascular:     Rate and Rhythm: Normal rate and regular rhythm.  Pulmonary:     Effort: Pulmonary effort is normal. No respiratory distress.  Neurological:     Mental Status: She is alert and oriented to person, place, and time.  Psychiatric:        Mood and Affect: Mood normal.        Behavior: Behavior normal.        Assessment/Plan: 1. Fatty liver Continue current diet and lifestyle modifications as previously discussed. Will try to add GLP-1, sample given, prior authorization will be done.   2. Vitamin D deficiency Restart weekly vitamin D supplement as prescribed.  - Vitamin D, Ergocalciferol, (DRISDOL) 1.25 MG (50000 UNIT) CAPS capsule; Take 1 capsule (50,000 Units total) by mouth every 7 (seven) days.  Dispense: 12 capsule; Refill: 1  3. Class 3 severe obesity due to excess calories with serious comorbidity and body mass index (BMI) of 45.0 to 49.9 in adult [facility]) Previously tried wegovy, ozempic and phentermine. Mounjaro sample given in place of zepbound. 5 mg dose of zepbound prescribed. Will complete prior authorization for this.  - tirzepatide (ZEPBOUND) 5 MG/0.5ML Pen; Inject 5 mg into the skin once a week.  Dispense: 2 mL; Refill: 5   General Counseling: Willis verbalizes understanding of the findings of todays visit and agrees with plan of treatment. I have  discussed any further diagnostic evaluation that may be needed or ordered today. We also reviewed her medications today. she has been encouraged to call the office with any questions or concerns that should arise related to todays visit.    No orders of the defined types were placed in this encounter.   Meds ordered this encounter  Medications   Vitamin D, Ergocalciferol, (DRISDOL) 1.25 MG (50000 UNIT) CAPS capsule    Sig: Take 1 capsule (50,000 Units total) by mouth every 7 (seven) days.    Dispense:  12 capsule    Refill:  1   tirzepatide (ZEPBOUND) 5 MG/0.5ML Pen    Sig: Inject 5 mg into the skin once a week.    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   Total time spent:30 Minutes Time spent includes review of chart, medications, test results, and follow up plan with the patient.   [place] Controlled Substance Database was reviewed by me.  This patient was seen by Sallyanne Kuster, FNP-C in collaboration with Dr. Beverely Risen as a part of collaborative care agreement.   Pierre Dellarocco R. Tedd Sias, MSN, FNP-C Internal medicine

## 2023-10-01 ENCOUNTER — Ambulatory Visit: Payer: Medicaid Other | Admitting: Nurse Practitioner

## 2023-10-01 ENCOUNTER — Encounter: Payer: Self-pay | Admitting: Nurse Practitioner

## 2023-10-01 VITALS — BP 128/88 | HR 95 | Temp 98.3°F | Resp 16 | Ht 60.0 in | Wt 241.2 lb

## 2023-10-01 DIAGNOSIS — E66813 Obesity, class 3: Secondary | ICD-10-CM

## 2023-10-01 DIAGNOSIS — E782 Mixed hyperlipidemia: Secondary | ICD-10-CM

## 2023-10-01 DIAGNOSIS — K760 Fatty (change of) liver, not elsewhere classified: Secondary | ICD-10-CM

## 2023-10-01 DIAGNOSIS — Z6841 Body Mass Index (BMI) 40.0 and over, adult: Secondary | ICD-10-CM

## 2023-10-01 NOTE — Progress Notes (Signed)
 [facility] [address] [place], [place] [address]  Internal MEDICINE  Office Visit Note  Patient Name: Natasha Moreno  [id number]  [id number]  Date of Service: 10/01/2023  Chief Complaint  Patient presents with   Hypertension   Hyperlipidemia   Follow-up    Eval new med and weight loss     HPI Chasady presents for a follow-up visit for fatty liver and weight loss management Weight loss -- lost 4 lbs since last visit, not able to get zepbound  yet, will complete prior authorization form for medicaid.  Fatty liver -- studies have shown can improve with GLP-1 medications High cholesterol -- on statin therapy and this will also improvement with weight loss and with improving her liver function.   Current Medication: Outpatient Encounter Medications as of 10/01/2023  Medication Sig   albuterol  (VENTOLIN  HFA) 108 (90 Base) MCG/ACT inhaler TAKE 2 PUFFS BY MOUTH EVERY 6 HOURS AS NEEDED FOR WHEEZE OR SHORTNESS OF BREATH   augmented betamethasone  dipropionate (DIPROLENE ) 0.05 % ointment Apply topically 2 (two) times daily.   cetirizine  (ZYRTEC ) 10 MG tablet Take 10 mg by mouth daily as needed for allergies.    famotidine  (PEPCID ) 20 MG tablet Take 1 tablet (20 mg total) by mouth 2 (two) times daily.   folic acid  (FOLVITE ) 1 MG tablet TAKE 1 TABLET BY MOUTH EVERY DAY   hydrochlorothiazide  (HYDRODIURIL ) 12.5 MG tablet Take 1 tablet (12.5 mg total) by mouth daily.   ketoconazole  (NIZORAL ) 2 % shampoo Apply 1 Application topically 2 (two) times a week.   rosuvastatin  (CRESTOR ) 5 MG tablet Take one tab po 3 x a week   tirzepatide  (ZEPBOUND ) 5 MG/0.5ML Pen Inject 5 mg into the skin once a week.   Vitamin D , Ergocalciferol , (DRISDOL ) 1.25 MG (50000 UNIT) CAPS capsule Take 1 capsule (50,000 Units total) by mouth every 7 (seven) days.   No facility-administered encounter medications on file as of 10/01/2023.    Surgical History: Past Surgical History:  Procedure Laterality Date    CHOLECYSTECTOMY     TEE WITHOUT CARDIOVERSION N/A 11/11/2017   Procedure: TRANSESOPHAGEAL ECHOCARDIOGRAM (TEE);  Surgeon: Bosie Vinie LABOR, MD;  Location: [facility] ORS;  Service: Cardiovascular;  Laterality: N/A;   TONSILLECTOMY AND ADENOIDECTOMY Bilateral 11/06/2021   Procedure: TONSILLECTOMY;  Surgeon: Blair Mt, MD;  Location: [facility] ORS;  Service: ENT;  Laterality: Bilateral;   TUBAL LIGATION     WISDOM TOOTH EXTRACTION      Medical History: Past Medical History:  Diagnosis Date   Anemia    Hemoglobin E-A disorder (HCC)    Hyperlipidemia    Hypertension    Migraine    Morbid obesity (HCC)    Sleep apnea    uses CPAP   Stroke [facility])     Family History: Family History  Problem Relation Age of Onset   Kidney failure Father    Heart attack Father        open heart surgery   Hypertension Father    Diabetes Father    Gout Father    Cataracts Father     Social History   Socioeconomic History   Marital status: Single    Spouse name: Not on file   Number of children: 2   Years of education: Not on file   Highest education level: High school graduate  Occupational History   Not on file  Tobacco Use   Smoking status: Former    Current packs/day: 0.00    Types: Cigarettes  Quit date: 11/09/2017    Years since quitting: 5.8   Smokeless tobacco: Never  Vaping Use   Vaping status: Former  Substance and Sexual Activity   Alcohol use: Yes    Comment: every other weekend, not much    Drug use: No   Sexual activity: Yes    Birth control/protection: Condom  Other Topics Concern   Not on file  Social History Narrative   Lives at home with her parents, brother and her children   Right handed   Drinks rare caffeine    Social Drivers of Corporate Investment Banker Strain: Low Risk  (11/09/2017)   Overall Financial Resource Strain (CARDIA)    Difficulty of Paying Living Expenses: Not hard at all  Food Insecurity: No Food Insecurity (11/09/2017)   Hunger Vital Sign     Worried About Running Out of Food in the Last Year: Never true    Ran Out of Food in the Last Year: Never true  Transportation Needs: No Transportation Needs (01/06/2018)   PRAPARE - Administrator, Civil Service (Medical): No    Lack of Transportation (Non-Medical): No  Recent Concern: Transportation Needs - Unmet Transportation Needs (11/09/2017)   PRAPARE - Transportation    Lack of Transportation (Medical): Yes    Lack of Transportation (Non-Medical): Yes  Physical Activity: Sufficiently Active (11/09/2017)   Exercise Vital Sign    Days of Exercise per Week: 3 days    Minutes of Exercise per Session: 50 min  Stress: No Stress Concern Present (11/09/2017)   [employer] of Occupational Health - Occupational Stress Questionnaire    Feeling of Stress : Not at all  Social Connections: Unknown (11/09/2017)   Social Connection and Isolation Panel [NHANES]    Frequency of Communication with Friends and Family: More than three times a week    Frequency of Social Gatherings with Friends and Family: Once a week    Attends Religious Services: Never    Database Administrator or Organizations: No    Attends Engineer, Structural: Never    Marital Status: Not on file  Intimate Partner Violence: Not on file      Review of Systems  Constitutional:  Positive for appetite change and unexpected weight change. Negative for fatigue and fever.  HENT: Negative.  Negative for congestion, mouth sores and postnasal drip.   Respiratory: Negative.  Negative for apnea, cough, chest tightness, shortness of breath and wheezing.   Cardiovascular:  Negative for chest pain and palpitations.  Genitourinary:  Negative for flank pain.  Psychiatric/Behavioral: Negative.  Negative for self-injury, sleep disturbance and suicidal ideas.     Vital Signs: BP 128/88   Pulse 95   Temp 98.3 F (36.8 C)   Resp 16   Ht 5' (1.524 m)   Wt 241 lb 3.2 oz (109.4 kg)   SpO2 97%   BMI 47.11 kg/m     Physical Exam Vitals reviewed.  Constitutional:      General: She is not in acute distress.    Appearance: Normal appearance. She is obese. She is not ill-appearing.  HENT:     Head: Normocephalic and atraumatic.  Eyes:     Pupils: Pupils are equal, round, and reactive to light.  Cardiovascular:     Rate and Rhythm: Normal rate and regular rhythm.  Pulmonary:     Effort: Pulmonary effort is normal. No respiratory distress.  Neurological:     Mental Status: She is alert and oriented  to person, place, and time.  Psychiatric:        Mood and Affect: Mood normal.        Behavior: Behavior normal.       Assessment/Plan: 1. Fatty liver (Primary) Will try to get zepbound  approved which will help with her fatty liver.   2. Mixed hyperlipidemia Continue rosuvastatin  as prescribed.   3. Class 3 severe obesity due to excess calories with serious comorbidity and body mass index (BMI) of 45.0 to 49.9 in adult [facility]) Zepbound  prescribed, will complete prior authorization, follow up in 2 months    General Counseling: Jamina verbalizes understanding of the findings of todays visit and agrees with plan of treatment. I have discussed any further diagnostic evaluation that may be needed or ordered today. We also reviewed her medications today. she has been encouraged to call the office with any questions or concerns that should arise related to todays visit.    No orders of the defined types were placed in this encounter.   No orders of the defined types were placed in this encounter.   Return in about 2 months (around 11/29/2023) for F/U, Weight loss, Keily Lepp PCP and fatty liver. .   Total time spent:30 Minutes Time spent includes review of chart, medications, test results, and follow up plan with the patient.   [place] Controlled Substance Database was reviewed by me.  This patient was seen by Mardy Maxin, FNP-C in collaboration with Dr. Sigrid Bathe as a part of collaborative  care agreement.   Karry Barrilleaux R. Maxin, MSN, FNP-C Internal medicine

## 2023-11-10 ENCOUNTER — Other Ambulatory Visit: Payer: Self-pay

## 2023-11-10 DIAGNOSIS — D560 Alpha thalassemia: Secondary | ICD-10-CM

## 2023-11-13 ENCOUNTER — Inpatient Hospital Stay: Payer: Medicaid Other | Attending: Oncology

## 2023-11-13 ENCOUNTER — Inpatient Hospital Stay: Payer: Medicaid Other | Admitting: Oncology

## 2023-11-25 ENCOUNTER — Ambulatory Visit: Payer: BC Managed Care – PPO | Admitting: Dermatology

## 2023-11-25 ENCOUNTER — Encounter: Payer: Self-pay | Admitting: Dermatology

## 2023-11-25 DIAGNOSIS — Z7189 Other specified counseling: Secondary | ICD-10-CM

## 2023-11-25 DIAGNOSIS — L409 Psoriasis, unspecified: Secondary | ICD-10-CM | POA: Diagnosis not present

## 2023-11-25 DIAGNOSIS — Z79899 Other long term (current) drug therapy: Secondary | ICD-10-CM

## 2023-11-25 MED ORDER — KETOCONAZOLE 2 % EX SHAM: MEDICATED_SHAMPOO | CUTANEOUS | 6 refills | Status: DC

## 2023-11-25 MED ORDER — CLOBETASOL PROPIONATE 0.05 % EX SHAM: MEDICATED_SHAMPOO | CUTANEOUS | 3 refills | Status: DC

## 2023-11-25 NOTE — Patient Instructions (Addendum)
 Start Clobetasol shampoo 3-4 days per week lather on a dry scalp, leave on 15-30 minutes, lather and rinse out.   Continue Ketoconazole 2% shampoo twice a week, lather on scalp, leave on 5-8 minutes, rinse out.     Psoriasis is a chronic non-curable, but treatable genetic/hereditary disease that may have other systemic features affecting other organ systems such as joints (Psoriatic Arthritis). It is associated with an increased risk of inflammatory bowel disease, heart disease, non-alcoholic fatty liver disease, and depression.  Treatments include light and laser treatments; topical medications; and systemic medications including oral and injectables.     Due to recent changes in healthcare laws, you may see results of your pathology and/or laboratory studies on MyChart before the doctors have had a chance to review them. We understand that in some cases there may be results that are confusing or concerning to you. Please understand that not all results are received at the same time and often the doctors may need to interpret multiple results in order to provide you with the best plan of care or course of treatment. Therefore, we ask that you please give [place] 2 business days to thoroughly review all your results before contacting the office for clarification. Should we see a critical lab result, you will be contacted sooner.   If You Need Anything After Your Visit  If you have any questions or concerns for your doctor, please call our main line at [phone] and press option 4 to reach your doctor's medical assistant. If no one answers, please leave a voicemail as directed and we will return your call as soon as possible. Messages left after 4 pm will be answered the following business day.   You may also send [place] a message via MyChart. We typically respond to MyChart messages within 1-2 business days.  For prescription refills, please ask your pharmacy to contact our office. Our fax number is  [phone].  If you have an urgent issue when the clinic is closed that cannot wait until the next business day, you can page your doctor at the number below.    Please note that while we do our best to be available for urgent issues outside of office hours, we are not available 24/7.   If you have an urgent issue and are unable to reach [place], you may choose to seek medical care at your doctor's office, retail clinic, urgent care center, or emergency room.  If you have a medical emergency, please immediately call 911 or go to the emergency department.  Pager Numbers  - Dr. Gwen Pounds: [phone]  - Dr. Roseanne Reno: [phone]  - Dr. Katrinka Blazing: [phone]   In the event of inclement weather, please call our main line at [phone] for an update on the status of any delays or closures.  Dermatology Medication Tips: Please keep the boxes that topical medications come in in order to help keep track of the instructions about where and how to use these. Pharmacies typically print the medication instructions only on the boxes and not directly on the medication tubes.   If your medication is too expensive, please contact our office at [phone] option 4 or send [place] a message through MyChart.   We are unable to tell what your co-pay for medications will be in advance as this is different depending on your insurance coverage. However, we may be able to find a substitute medication at lower cost or fill out paperwork to get insurance to cover a needed medication.  If a prior authorization is required to get your medication covered by your insurance company, please allow [place] 1-2 business days to complete this process.  Drug prices often vary depending on where the prescription is filled and some pharmacies may offer cheaper prices.  The website www.goodrx.com contains coupons for medications through different pharmacies. The prices here do not account for what the cost may be with help from  insurance (it may be cheaper with your insurance), but the website can give you the price if you did not use any insurance.  - You can print the associated coupon and take it with your prescription to the pharmacy.  - You may also stop by our office during regular business hours and pick up a GoodRx coupon card.  - If you need your prescription sent electronically to a different pharmacy, notify our office through [facility] or by phone at [phone] option 4.     Si Usted Necesita Algo Despus de Su Visita  Tambin puede enviarnos un mensaje a travs de Clinical cytogeneticist. Por lo general respondemos a los mensajes de MyChart en el transcurso de 1 a 2 das hbiles.  Para renovar recetas, por favor pida a su farmacia que se ponga en contacto con nuestra oficina. Annie Sable de fax es [place] [phone].  Si tiene un asunto urgente cuando la clnica est cerrada y que no puede esperar hasta el siguiente da hbil, puede llamar/localizar a su doctor(a) al nmero que aparece a continuacin.   Por favor, tenga en cuenta que aunque hacemos todo lo posible para estar disponibles para asuntos urgentes fuera del horario de [place], no estamos disponibles las 24 horas del da, los 7 [address]  [address] de la [place].   Si tiene un problema urgente y no puede comunicarse con nosotros, puede optar por buscar atencin mdica  en el consultorio de su doctor(a), en una clnica privada, en un centro de atencin urgente o en una sala de emergencias.  Si tiene Engineer, drilling, por favor llame inmediatamente al 911 o vaya a la sala de emergencias.  Nmeros de bper  - Dr. Gwen Pounds: [phone]  - Dra. Roseanne Reno: [phone]  - Dr. Katrinka Blazing: [phone]   En caso de inclemencias del tiempo, por favor llame a Lacy Duverney principal al [phone] para una actualizacin sobre el [place] de cualquier retraso o cierre.  Consejos para la medicacin en dermatologa: Por favor, guarde las cajas en las que vienen los  medicamentos de uso tpico para ayudarle a seguir las instrucciones sobre dnde y cmo usarlos. Las farmacias generalmente imprimen las instrucciones del medicamento slo en las cajas y no directamente en los tubos del [place].   Si su medicamento es muy caro, por favor, pngase en contacto con Rolm Gala llamando al [phone] y presione la opcin 4 o envenos un mensaje a travs de Clinical cytogeneticist.   No podemos decirle cul ser su copago por los medicamentos por adelantado ya que esto es diferente dependiendo de la cobertura de su seguro. Sin embargo, es posible que podamos encontrar un medicamento sustituto a Audiological scientist un formulario para que el seguro cubra el medicamento que se considera necesario.   Si se requiere una autorizacin previa para que su compaa de seguros [place] su medicamento, por favor permtanos de 1 a 2 das hbiles para completar [address]  Los precios de los medicamentos varan con frecuencia dependiendo del Environmental consultant de dnde se surte la receta y alguna farmacias pueden ofrecer precios ms baratos.  El sitio web  www.goodrx.com tiene cupones para medicamentos de Health and safety inspector. Los precios aqu no tienen en cuenta lo que podra costar con la ayuda del seguro (puede ser ms barato con su seguro), pero el sitio web puede darle el precio si no utiliz Tourist information centre manager.  - Puede imprimir el cupn correspondiente y llevarlo con su receta a la farmacia.  - Tambin puede pasar por nuestra oficina durante el horario de atencin regular y Education officer, museum una tarjeta de cupones de GoodRx.  - Si necesita que su receta se enve electrnicamente a una farmacia diferente, informe a nuestra oficina a travs de MyChart de [place] o por telfono llamando al [phone] y presione la opcin 4.

## 2023-11-25 NOTE — Progress Notes (Signed)
   New Patient Visit   Subjective  Natasha Moreno is a 43 y.o. female who presents for the following: Rash on scalp. Dry, scaly. Dur: years, worsening. PCP prescribed Ketoconazole 2% shampoo, used twice a week for a couple of months. Stopped using because it did not help very much. Family Hx of psoriasis (Uncles).   The following portions of the chart were reviewed this encounter and updated as appropriate: medications, allergies, medical history  Review of Systems:  No other skin or systemic complaints except as noted in HPI or Assessment and Plan.  Objective  Well appearing patient in no apparent distress; mood and affect are within normal limits.  A focused examination was performed of the following areas: scalp  Relevant exam findings are noted in the Assessment and Plan.  Assessment & Plan   PSORIASIS With + FH psoriasis in uncles Exam: Well-demarcated erythematous papules/plaques with silvery scale at scalp, hyperpigmentation and thickening at elbows. 6% BSA.  Chronic and persistent condition with duration or expected duration over one year. Condition is symptomatic/ bothersome to patient. Not currently at goal. patient denies joint pain  Psoriasis is a chronic non-curable, but treatable genetic/hereditary disease that may have other systemic features affecting other organ systems such as joints (Psoriatic Arthritis). It is associated with an increased risk of inflammatory bowel disease, heart disease, non-alcoholic fatty liver disease, and depression.  Treatments include light and laser treatments; topical medications; and systemic medications including oral and injectables.  Treatment Plan: Start Clobetasol shampoo 3-4 days per week lather on a dry scalp, leave on 15-30 minutes, lather and rinse out.  Continue Ketoconazole 2% shampoo twice a week, lather on scalp, leave on 5-8 minutes, rinse out.   Return in about 4 months (around 03/26/2024) for Psoriasis Follow Up.  I,  Lawson Radar, CMA, am acting as scribe for Armida Sans, MD.   Documentation: I have reviewed the above documentation for accuracy and completeness, and I agree with the above.  Armida Sans, MD

## 2023-11-30 ENCOUNTER — Ambulatory Visit: Payer: Medicaid Other | Admitting: Nurse Practitioner

## 2023-12-01 ENCOUNTER — Telehealth: Payer: Self-pay | Admitting: Internal Medicine

## 2023-12-01 NOTE — Telephone Encounter (Signed)
 Received request from [facility] for cpap use documentation. Lvm for patient to return my call to schedule appointment-Toni

## 2024-01-01 ENCOUNTER — Telehealth: Admitting: Physician Assistant

## 2024-01-01 ENCOUNTER — Encounter: Payer: Self-pay | Admitting: Physician Assistant

## 2024-01-01 VITALS — Resp 16 | Ht 60.0 in

## 2024-01-01 DIAGNOSIS — J069 Acute upper respiratory infection, unspecified: Secondary | ICD-10-CM

## 2024-01-01 DIAGNOSIS — J4520 Mild intermittent asthma, uncomplicated: Secondary | ICD-10-CM | POA: Diagnosis not present

## 2024-01-01 MED ORDER — AZITHROMYCIN 250 MG PO TABS: ORAL_TABLET | ORAL | 0 refills | Status: AC

## 2024-01-01 NOTE — Progress Notes (Signed)
 [facility] [address] [place], [place] [address]  Internal MEDICINE  Telephone Visit  Patient Name: Natasha Moreno  [id number]  [id number]  Date of Service: 01/01/2024  I connected with the patient at 10:02 by telephone and verified the patients identity using two identifiers.   I discussed the limitations, risks, security and privacy concerns of performing an evaluation and management service by telephone and the availability of in person appointments. I also discussed with the patient that there may be a patient responsible charge related to the service.  The patient expressed understanding and agrees to proceed.    Chief Complaint  Patient presents with   Telephone Assessment    Patient ok with either telephone/video: [phone].   Telephone Screen   Cough    Negative for covid, symptoms began last weekend.    Nasal Congestion   Sore Throat   Headache   Shortness of Breath    HPI Pt is here for virtual sick visit -Symptoms started last weekend -Has been having congestion, sore throat, headache, runny nose, sinus pressure, productive cough. Some SOB and wheezing. -Using albuterol , nyquil -negative for covid -no fever or chills, no N/V/D. Some aches in shoulders  Current Medication: Outpatient Encounter Medications as of 01/01/2024  Medication Sig   albuterol  (VENTOLIN  HFA) 108 (90 Base) MCG/ACT inhaler TAKE 2 PUFFS BY MOUTH EVERY 6 HOURS AS NEEDED FOR WHEEZE OR SHORTNESS OF BREATH   augmented betamethasone  dipropionate (DIPROLENE ) 0.05 % ointment Apply topically 2 (two) times daily.   [EXPIRED] azithromycin  (ZITHROMAX ) 250 MG tablet Take 2 tablets on day 1, then 1 tablet daily on days 2 through 5   cetirizine (ZYRTEC) 10 MG tablet Take 10 mg by mouth daily as needed for allergies.    Clobetasol  Propionate 0.05 % shampoo 3-4 days per week lather on a dry scalp, leave on 15-30 minutes, lather and rinse ou   famotidine  (PEPCID ) 20 MG tablet Take 1 tablet  (20 mg total) by mouth 2 (two) times daily.   folic acid  (FOLVITE ) 1 MG tablet TAKE 1 TABLET BY MOUTH EVERY DAY   hydrochlorothiazide  (HYDRODIURIL ) 12.5 MG tablet Take 1 tablet (12.5 mg total) by mouth daily.   ketoconazole  (NIZORAL ) 2 % shampoo Twice a week, lather on scalp, leave on 5-8 minutes, rinse out   rosuvastatin  (CRESTOR ) 5 MG tablet Take one tab po 3 x a week   tirzepatide  (ZEPBOUND ) 5 MG/0.5ML Pen Inject 5 mg into the skin once a week.   Vitamin D , Ergocalciferol , (DRISDOL ) 1.25 MG (50000 UNIT) CAPS capsule Take 1 capsule (50,000 Units total) by mouth every 7 (seven) days.   No facility-administered encounter medications on file as of 01/01/2024.    Surgical History: Past Surgical History:  Procedure Laterality Date   CHOLECYSTECTOMY     TEE WITHOUT CARDIOVERSION N/A 11/11/2017   Procedure: TRANSESOPHAGEAL ECHOCARDIOGRAM (TEE);  Surgeon: Ronney Cola, MD;  Location: [facility] ORS;  Service: Cardiovascular;  Laterality: N/A;   TONSILLECTOMY AND ADENOIDECTOMY Bilateral 11/06/2021   Procedure: TONSILLECTOMY;  Surgeon: Von Grumbling, MD;  Location: [facility] ORS;  Service: ENT;  Laterality: Bilateral;   TUBAL LIGATION     WISDOM TOOTH EXTRACTION      Medical History: Past Medical History:  Diagnosis Date   Anemia    Hemoglobin E-A disorder (HCC)    Hyperlipidemia    Hypertension    Migraine    Morbid obesity (HCC)    Sleep apnea    uses CPAP   Stroke (HCC)  Family History: Family History  Problem Relation Age of Onset   Kidney failure Father    Heart attack Father        open heart surgery   Hypertension Father    Diabetes Father    Gout Father    Cataracts Father     Social History   Socioeconomic History   Marital status: Single    Spouse name: Not on file   Number of children: 2   Years of education: Not on file   Highest education level: High school graduate  Occupational History   Not on file  Tobacco Use   Smoking status: Former    Current  packs/day: 0.00    Types: Cigarettes    Quit date: 11/09/2017    Years since quitting: 6.1   Smokeless tobacco: Never  Vaping Use   Vaping status: Former  Substance and Sexual Activity   Alcohol use: Yes    Comment: every other weekend, not much    Drug use: No   Sexual activity: Yes    Birth control/protection: Condom  Other Topics Concern   Not on file  Social History Narrative   Lives at home with her parents, brother and her children   Right handed   Drinks rare caffeine    Social Drivers of Corporate investment banker Strain: Low Risk  (11/09/2017)   Overall Financial Resource Strain (CARDIA)    Difficulty of Paying Living Expenses: Not hard at all  Food Insecurity: No Food Insecurity (11/09/2017)   Hunger Vital Sign    Worried About Running Out of Food in the Last Year: Never true    Ran Out of Food in the Last Year: Never true  Transportation Needs: No Transportation Needs (01/06/2018)   PRAPARE - Administrator, Civil Service (Medical): No    Lack of Transportation (Non-Medical): No  Recent Concern: Transportation Needs - Unmet Transportation Needs (11/09/2017)   PRAPARE - Transportation    Lack of Transportation (Medical): Yes    Lack of Transportation (Non-Medical): Yes  Physical Activity: Sufficiently Active (11/09/2017)   Exercise Vital Sign    Days of Exercise per Week: 3 days    Minutes of Exercise per Session: 50 min  Stress: No Stress Concern Present (11/09/2017)   [employer] of Occupational Health - Occupational Stress Questionnaire    Feeling of Stress : Not at all  Social Connections: Unknown (11/09/2017)   Social Connection and Isolation Panel [NHANES]    Frequency of Communication with Friends and Family: More than three times a week    Frequency of Social Gatherings with Friends and Family: Once a week    Attends Religious Services: Never    Database administrator or Organizations: No    Attends Banker Meetings: Never     Marital Status: Not on file  Intimate Partner Violence: Not on file      Review of Systems  Constitutional:  Positive for fatigue. Negative for chills and fever.  HENT:  Positive for congestion, postnasal drip, sinus pressure and sore throat. Negative for mouth sores.   Respiratory:  Positive for cough, shortness of breath and wheezing.   Cardiovascular:  Negative for chest pain.  Genitourinary:  Negative for flank pain.  Musculoskeletal:  Positive for myalgias.  Neurological:  Positive for headaches.  Psychiatric/Behavioral: Negative.      Vital Signs: Resp 16   Ht 5' (1.524 m)   BMI 47.11 kg/m    Observation/Objective:  Pt is able to carry out conversation   Assessment/Plan: 1. Upper respiratory tract infection, unspecified type (Primary) Will start zpak, may use mucinex and nasal spray to help congestion. Advised to rest and stay well hydrated. May continue albuterol  prn - azithromycin  (ZITHROMAX ) 250 MG tablet; Take 2 tablets on day 1, then 1 tablet daily on days 2 through 5  Dispense: 6 tablet; Refill: 0  2. Mild intermittent asthma without complication Continue albuterol  prn   General Counseling: Yaa verbalizes understanding of the findings of today's phone visit and agrees with plan of treatment. I have discussed any further diagnostic evaluation that may be needed or ordered today. We also reviewed her medications today. she has been encouraged to call the office with any questions or concerns that should arise related to todays visit.    No orders of the defined types were placed in this encounter.   Meds ordered this encounter  Medications   azithromycin  (ZITHROMAX ) 250 MG tablet    Sig: Take 2 tablets on day 1, then 1 tablet daily on days 2 through 5    Dispense:  6 tablet    Refill:  0    Time spent:25 Minutes    Dr Lawton Price Internal medicine

## 2024-04-12 ENCOUNTER — Ambulatory Visit: Admitting: Dermatology

## 2024-05-16 ENCOUNTER — Other Ambulatory Visit: Payer: Self-pay | Admitting: Nurse Practitioner

## 2024-05-16 DIAGNOSIS — E782 Mixed hyperlipidemia: Secondary | ICD-10-CM

## 2024-05-22 ENCOUNTER — Other Ambulatory Visit: Payer: Self-pay | Admitting: Nurse Practitioner

## 2024-05-22 DIAGNOSIS — I10 Essential (primary) hypertension: Secondary | ICD-10-CM

## 2024-07-20 ENCOUNTER — Other Ambulatory Visit: Payer: Self-pay

## 2024-07-20 ENCOUNTER — Encounter: Payer: Self-pay | Admitting: *Deleted

## 2024-07-20 ENCOUNTER — Emergency Department
Admission: EM | Admit: 2024-07-20 | Discharge: 2024-07-21 | Disposition: A | Discharge status: Home / Self Care | Attending: Emergency Medicine | Admitting: Emergency Medicine

## 2024-07-20 ENCOUNTER — Emergency Department

## 2024-07-20 DIAGNOSIS — Z8673 Personal history of transient ischemic attack (TIA), and cerebral infarction without residual deficits: Secondary | ICD-10-CM | POA: Insufficient documentation

## 2024-07-20 DIAGNOSIS — R519 Headache, unspecified: Secondary | ICD-10-CM | POA: Insufficient documentation

## 2024-07-20 DIAGNOSIS — H538 Other visual disturbances: Secondary | ICD-10-CM | POA: Insufficient documentation

## 2024-07-20 DIAGNOSIS — H539 Unspecified visual disturbance: Secondary | ICD-10-CM

## 2024-07-20 LAB — COMPREHENSIVE METABOLIC PANEL WITH GFR
ALT: 109 U/L — ABNORMAL HIGH (ref 0–44)
AST: 53 U/L — ABNORMAL HIGH (ref 15–41)
Albumin: 3.6 g/dL (ref 3.5–5.0)
Alkaline Phosphatase: 79 U/L (ref 38–126)
Anion gap: 9 (ref 5–15)
BUN: 12 mg/dL (ref 6–20)
CO2: 25 mmol/L (ref 22–32)
Calcium: 9.5 mg/dL (ref 8.9–10.3)
Chloride: 104 mmol/L (ref 98–111)
Creatinine, Ser: 0.57 mg/dL (ref 0.44–1.00)
GFR, Estimated: >60 mL/min (ref >60)
Glucose, Bld: 94 mg/dL (ref 70–99)
Potassium: 3.8 mmol/L (ref 3.5–5.1)
Sodium: 138 mmol/L (ref 135–145)
Total Bilirubin: 0.8 mg/dL (ref 0.0–1.2)
Total Protein: 7.2 g/dL (ref 6.5–8.1)

## 2024-07-20 LAB — CBC WITH DIFFERENTIAL/PLATELET
Abs Immature Granulocytes: 0.15 K/uL — ABNORMAL HIGH (ref 0.00–0.07)
Basophils Absolute: 0.1 K/uL (ref 0.0–0.1)
Basophils Relative: 1 %
Eosinophils Absolute: 0.8 K/uL — ABNORMAL HIGH (ref 0.0–0.5)
Eosinophils Relative: 7 %
HCT: 35.8 % — ABNORMAL LOW (ref 36.0–46.0)
Hemoglobin: 10.9 g/dL — ABNORMAL LOW (ref 12.0–15.0)
Immature Granulocytes: 1 %
Lymphocytes Relative: 36 %
Lymphs Abs: 4.3 K/uL — ABNORMAL HIGH (ref 0.7–4.0)
MCH: 16.3 pg — ABNORMAL LOW (ref 26.0–34.0)
MCHC: 30.4 g/dL (ref 30.0–36.0)
MCV: 53.6 fL — ABNORMAL LOW (ref 80.0–100.0)
Monocytes Absolute: 0.8 K/uL (ref 0.1–1.0)
Monocytes Relative: 7 %
Neutro Abs: 5.7 K/uL (ref 1.7–7.7)
Neutrophils Relative %: 48 %
Platelets: 344 K/uL (ref 150–400)
RBC: 6.68 MIL/uL — ABNORMAL HIGH (ref 3.87–5.11)
RDW: 21.6 % — ABNORMAL HIGH (ref 11.5–15.5)
Smear Review: NORMAL
WBC: 11.8 K/uL — ABNORMAL HIGH (ref 4.0–10.5)
nRBC: 0.2 % (ref 0.0–0.2)

## 2024-07-20 LAB — CBG MONITORING, ED: Glucose-Capillary: 90 mg/dL (ref 70–99)

## 2024-07-20 MED ORDER — PROCHLORPERAZINE EDISYLATE 10 MG/2ML IJ SOLN
10.0000 mg | Freq: Once | INTRAMUSCULAR | Status: AC
  Administered 2024-07-20: 10 mg via INTRAVENOUS
  Filled 2024-07-20: qty 2

## 2024-07-20 MED ORDER — KETOROLAC TROMETHAMINE 30 MG/ML IJ SOLN
15.0000 mg | Freq: Once | INTRAMUSCULAR | Status: AC
  Administered 2024-07-20: 15 mg via INTRAVENOUS
  Filled 2024-07-20: qty 1

## 2024-07-20 MED ORDER — DIPHENHYDRAMINE HCL 50 MG/ML IJ SOLN
25.0000 mg | Freq: Once | INTRAMUSCULAR | Status: AC
  Administered 2024-07-20: 25 mg via INTRAVENOUS
  Filled 2024-07-20: qty 1

## 2024-07-20 MED ORDER — SODIUM CHLORIDE 0.9 % IV BOLUS
1000.0000 mL | Freq: Once | INTRAVENOUS | Status: AC
  Administered 2024-07-21: 1000 mL via INTRAVENOUS

## 2024-07-20 NOTE — ED Notes (Signed)
 Pt to room 18 from triage.

## 2024-07-20 NOTE — ED Provider Notes (Signed)
-----------------------------------------   11:23 PM on 07/20/2024 -----------------------------------------  Assuming care from Dr. Levander.  In short, Natasha Moreno is a 43 y.o. female with a chief complaint of headache.  Refer to the original H&P for additional details.  The current plan of care is to follow up after labs and treatment and reassess.   Clinical Course as of 07/21/24 9341  Wed Jul 20, 2024  2353 Introduced myself to patient, she is feeling well and asymptomatic at this time.  We discussed the plan and she agrees to proceed with MRI brain after she gets her medicine, in fact that is her strong preference.  Given her history of inexplicable stroke at a young age, I think that is very reasonable and we will order it while she gets her meds [CF]  Thu Jul 21, 2024  0209 MR BRAIN WO CONTRAST I independently reviewed and interpreted the patient's MR brain and I see no evidence of CVA.  Confirmed by radiology.  I reassessed the patient and her headache is gone.  She is comfortable with the plan for discharge and outpatient follow-up.  I gave my usual and customary return precautions [CF]    Clinical Course User Index [CF] Gordan Huxley, MD     Medications  sodium chloride  0.9 % bolus 1,000 mL (has no administration in time range)  ketorolac  (TORADOL ) 30 MG/ML injection 15 mg (has no administration in time range)  diphenhydrAMINE  (BENADRYL ) injection 25 mg (has no administration in time range)  prochlorperazine  (COMPAZINE ) injection 10 mg (has no administration in time range)     ED Discharge Orders     None      Final diagnoses:  None     Gordan Huxley, MD 07/21/24 [phone]

## 2024-07-20 NOTE — ED Triage Notes (Signed)
 Pt ambulatory to triage.  Pt reports a headache since 1600 today.  Pt reports blurry line in left eye at 0900 this am and again at 2141.  Hx cva.  Pt alert  speech clear.

## 2024-07-20 NOTE — ED Provider Notes (Signed)
 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 Around 9AM this morning patient noticed a small area of blurry vision in her visual field which she thought may be related to a crack in her glasses.  This went away after a few minutes, but she noticed onset of a headache shortly after.  Does report she has had headaches in the past but is not common for her, no prior diagnosed history of migraine. Around 940pm. she had another episode of a couple minutes of a small area of blurred vision in her left eye.  Denies history of similar.  Denies other visual changes.  Does feel back to her baseline.  No numbness, tingling, focal weakness, recent head trauma.  Does report ongoing headache.      Physical Exam   Triage Vital Signs: ED Triage Vitals  Encounter Vitals Group     BP 07/20/24 2203 (!) 141/107     Girls Systolic BP Percentile --      Girls Diastolic BP Percentile --      Boys Systolic BP Percentile --      Boys Diastolic BP Percentile --      Pulse Rate 07/20/24 2203 91     Resp 07/20/24 2203 20     Temp 07/20/24 2203 97.9 F (36.6 C)     Temp Source 07/20/24 2203 Oral     SpO2 07/20/24 2203 98 %     Weight 07/20/24 2201 250 lb (113.4 kg)     Height 07/20/24 2201 5' (1.524 m)     Head Circumference --      Peak Flow --      Pain Score 07/20/24 2201 6     Pain Loc --      Pain Education --      Exclude from Growth Chart --     Most recent vital signs: Vitals:   07/20/24 2203  BP: (!) 141/107  Pulse: 91  Resp: 20  Temp: 97.9 F (36.6 C)  SpO2: 98%     General: Awake, interactive  CV:  Good peripheral perfusion Resp:  Unlabored respirations, lungs clear to  auscultation Abd:  Nondistended.  Neuro:  Keenly aware, correctly answers month and age, able to blink eyes and squeeze hands, normal horizontal extraocular movements,, 5 out of 5 strength of the bilateral upper and lower extremities, no visual field loss, normal facial symmetry, no arm or leg motor drift, no limb ataxia, normal sensation, no aphasia, no dysarthria, no inattention. NIH 0   ED Results / Procedures / Treatments   Labs (all labs ordered are listed, but only abnormal results are displayed) Labs Reviewed  COMPREHENSIVE METABOLIC PANEL WITH GFR - Abnormal; Notable for the following components:      Result Value   AST 53 (*)    ALT 109 (*)    All other components within normal limits  CBC WITH DIFFERENTIAL/PLATELET  CBG MONITORING, ED  POC URINE PREG, ED     EKG EKG independently reviewed and interpreted by myself demonstrates:    RADIOLOGY Imaging independently reviewed and interpreted by myself demonstrates:  CT head pending  Formal Radiology Read:  CT Head Wo Contrast Result  Date: 07/20/2024 CLINICAL DATA:  Headache EXAM: CT HEAD WITHOUT CONTRAST TECHNIQUE: Contiguous axial images were obtained from the base of the skull through the vertex without intravenous contrast. RADIATION DOSE REDUCTION: This exam was performed according to the departmental dose-optimization program which includes automated exposure control, adjustment of the mA and/or kV according to patient size and/or use of iterative reconstruction technique. COMPARISON:  MRI 11/10/2017 FINDINGS: Brain: No evidence of acute infarction, hemorrhage, hydrocephalus, extra-axial collection or mass lesion/mass effect. Vascular: No hyperdense vessel or unexpected calcification. Skull: Normal. Negative for fracture or focal lesion. Sinuses/Orbits: No acute finding. Other: None IMPRESSION: Negative non contrasted CT appearance of the brain. Electronically Signed   By: Luke Bun M.D.   On: 07/20/2024 23:18     PROCEDURES:  Critical Care performed: No  Procedures   MEDICATIONS ORDERED IN ED: Medications  sodium chloride  0.9 % bolus 1,000 mL (has no administration in time range)  ketorolac  (TORADOL ) 30 MG/ML injection 15 mg (has no administration in time range)  diphenhydrAMINE  (BENADRYL ) injection 25 mg (has no administration in time range)  prochlorperazine  (COMPAZINE ) injection 10 mg (has no administration in time range)     IMPRESSION / MDM / ASSESSMENT AND PLAN / ED COURSE  I reviewed the triage vital signs and the nursing notes.  Differential diagnosis includes, but is not limited to, complex migraine, intracranial bleed, low suspicion CVA given resolution of symptoms, absence of focal deficits, clinical history low suggestive of TIA, low suspicion for retinal detachment given resolution of visual symptoms  Patient's presentation is most consistent with acute presentation with potential threat to life or bodily function.  43 year old female presenting to the emergency department for evaluation of headache with 2 brief episodes of resolved area of visual change.  Stable vitals on presentation.  Reassuring neurologic exam, NIH 0 on presentation.  No indication for code stroke activation.  Consideration for complex migraine given headache around onset of symptoms.  Labs, CT head ordered as well as migraine cocktail.  Signed out to oncoming physician at 2315 pending CT, reevaluation and disposition.      FINAL CLINICAL IMPRESSION(S) / ED DIAGNOSES   Final diagnoses:  Acute nonintractable headache, unspecified headache type  Vision changes     Rx / DC Orders   ED Discharge Orders     None        Note:  This document was prepared using Dragon voice recognition software and may include unintentional dictation errors.   Levander Slate, MD 07/20/24 [phone]

## 2024-07-20 NOTE — ED Notes (Signed)
 Fsbs 90 in triage.

## 2024-07-21 ENCOUNTER — Emergency Department

## 2024-07-21 NOTE — Discharge Instructions (Signed)
 Your workup in the Emergency Department today was reassuring.  We did not find any specific abnormalities.  We recommend you drink plenty of fluids, take your regular medications and/or any new ones prescribed today, and follow up with the doctor(s) listed in these documents as recommended.  Return to the Emergency Department if you develop new or worsening symptoms that concern you.

## 2024-08-09 ENCOUNTER — Encounter: Payer: Self-pay | Admitting: Nurse Practitioner

## 2024-08-09 ENCOUNTER — Ambulatory Visit: Payer: Medicaid Other | Admitting: Nurse Practitioner

## 2024-08-09 VITALS — BP 138/88 | HR 73 | Temp 98.0°F | Resp 16 | Ht 60.0 in | Wt 251.0 lb

## 2024-08-09 DIAGNOSIS — E782 Mixed hyperlipidemia: Secondary | ICD-10-CM

## 2024-08-09 DIAGNOSIS — E559 Vitamin D deficiency, unspecified: Secondary | ICD-10-CM | POA: Diagnosis not present

## 2024-08-09 DIAGNOSIS — L409 Psoriasis, unspecified: Secondary | ICD-10-CM

## 2024-08-09 DIAGNOSIS — E538 Deficiency of other specified B group vitamins: Secondary | ICD-10-CM

## 2024-08-09 DIAGNOSIS — Z8673 Personal history of transient ischemic attack (TIA), and cerebral infarction without residual deficits: Secondary | ICD-10-CM

## 2024-08-09 DIAGNOSIS — Z0001 Encounter for general adult medical examination with abnormal findings: Secondary | ICD-10-CM | POA: Diagnosis not present

## 2024-08-09 DIAGNOSIS — K219 Gastro-esophageal reflux disease without esophagitis: Secondary | ICD-10-CM | POA: Diagnosis not present

## 2024-08-09 DIAGNOSIS — Z1231 Encounter for screening mammogram for malignant neoplasm of breast: Secondary | ICD-10-CM

## 2024-08-09 DIAGNOSIS — D560 Alpha thalassemia: Secondary | ICD-10-CM | POA: Diagnosis not present

## 2024-08-09 DIAGNOSIS — K760 Fatty (change of) liver, not elsewhere classified: Secondary | ICD-10-CM | POA: Diagnosis not present

## 2024-08-09 MED ORDER — CETIRIZINE HCL 10 MG PO TABS: 10.0000 mg | ORAL_TABLET | Freq: Every day | ORAL | 1 refills | Status: AC | PRN

## 2024-08-09 MED ORDER — KETOCONAZOLE 2 % EX SHAM: MEDICATED_SHAMPOO | CUTANEOUS | 6 refills | Status: AC

## 2024-08-09 MED ORDER — FAMOTIDINE 20 MG PO TABS: 20.0000 mg | ORAL_TABLET | Freq: Two times a day (BID) | ORAL | 1 refills | Status: AC

## 2024-08-09 NOTE — Progress Notes (Signed)
 [facility] [address] [place], [place] [address]  Internal MEDICINE  Office Visit Note  Patient Name: Natasha Moreno  [id number]  [id number]  Date of Service: 08/09/2024  Chief Complaint  Patient presents with   Hyperlipidemia   Hypertension   Annual Exam    HPI Natasha Moreno presents for an annual well visit and physical exam.  Well-appearing 43 y.o. female with hemoglobin H disease, hypertension, OSA, GERD, hypertension, fatty liver, hepatomegaly and history of stroke.  Routine CRC screening: due in 3 more years.  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 Wants to have PCP continue routine monitoring for this.  Hypertension Seen in the ED in late October for concern for possible stroke but this was ruled out. They said it was a migriane vs low suspicion of TIA.    Fibrosis 4 Score = .62 (Low risk)      Interpretation for patients with NAFLD          <1.30       -  F0-F1 (Low risk)          1.30-2.67 -  Indeterminate           >2.67      -  F3-F4 (High risk)     Validated for ages 18-65        Current Medication: Outpatient Encounter Medications as of 08/09/2024  Medication Sig   albuterol  (VENTOLIN  HFA) 108 (90 Base) MCG/ACT inhaler TAKE 2 PUFFS BY MOUTH EVERY 6 HOURS AS NEEDED FOR WHEEZE OR SHORTNESS OF BREATH   augmented betamethasone  dipropionate (DIPROLENE ) 0.05 % ointment Apply topically 2 (two) times daily.   cetirizine  (ZYRTEC ) 10 MG tablet Take 1 tablet (10 mg total) by mouth daily as needed for allergies.   Clobetasol  Propionate 0.05 % shampoo 3-4 days per week lather on a dry scalp, leave on 15-30 minutes, lather and rinse ou   famotidine  (PEPCID ) 20 MG tablet Take 1 tablet (20 mg total) by mouth 2 (two) times daily.   folic  acid (FOLVITE ) 1 MG tablet TAKE 1 TABLET BY MOUTH EVERY DAY   hydrochlorothiazide  (HYDRODIURIL ) 12.5 MG tablet TAKE 1 TABLET BY MOUTH EVERY DAY   ketoconazole  (NIZORAL ) 2 % shampoo Twice a week, lather on scalp, leave on 5-8 minutes, rinse out   rosuvastatin  (CRESTOR ) 5 MG tablet TAKE ONE TAB BY MOUTH 3 X A WEEK   Vitamin D , Ergocalciferol , (DRISDOL ) 1.25 MG (50000 UNIT) CAPS capsule Take 1 capsule (50,000 Units total) by mouth every 7 (seven) days.   [DISCONTINUED] cetirizine  (ZYRTEC ) 10 MG tablet Take 10 mg by mouth daily as needed for allergies.    [DISCONTINUED] famotidine  (PEPCID ) 20 MG tablet Take 1 tablet (20 mg total) by mouth 2 (two) times daily.   [DISCONTINUED] ketoconazole  (NIZORAL ) 2 % shampoo Twice a week, lather on scalp, leave on 5-8 minutes, rinse out   [DISCONTINUED] tirzepatide  (ZEPBOUND ) 5 MG/0.5ML Pen Inject 5 mg into the skin once a week.   No facility-administered encounter medications on file as of 08/09/2024.    Surgical History: Past Surgical History:  Procedure Laterality Date   CHOLECYSTECTOMY     TEE WITHOUT CARDIOVERSION N/A 11/11/2017   Procedure: TRANSESOPHAGEAL ECHOCARDIOGRAM (TEE);  Surgeon: Bosie Vinie LABOR, MD;  Location: [facility] ORS;  Service: Cardiovascular;  Laterality: N/A;   TONSILLECTOMY AND ADENOIDECTOMY Bilateral 11/06/2021   Procedure: TONSILLECTOMY;  Surgeon: Blair Mt, MD;  Location: [facility] ORS;  Service: ENT;  Laterality: Bilateral;   TUBAL LIGATION     WISDOM TOOTH EXTRACTION      Medical History: Past Medical History:  Diagnosis Date   Anemia    Hemoglobin E-A disorder    Hyperlipidemia    Hypertension    Migraine    Morbid obesity (HCC)    Sleep apnea    uses CPAP   Stroke [facility])     Family History: Family History  Problem Relation Age of Onset   Kidney failure Father    Heart attack Father        open heart surgery   Hypertension Father    Diabetes Father    Gout Father    Cataracts Father     Social History    Socioeconomic History   Marital status: Single    Spouse name: Not on file   Number of children: 2   Years of education: Not on file   Highest education level: High school graduate  Occupational History   Not on file  Tobacco Use   Smoking status: Former    Current packs/day: 0.00    Types: Cigarettes    Quit date: 11/09/2017    Years since quitting: 6.7   Smokeless tobacco: Never  Vaping Use   Vaping status: Former  Substance and Sexual Activity   Alcohol use: Yes    Comment: every other weekend, not much    Drug use: No   Sexual activity: Yes    Birth control/protection: Condom  Other Topics Concern   Not on file  Social History Narrative   Lives at home with her parents, brother and her children   Right handed   Drinks rare caffeine    Social Drivers of Corporate Investment Banker Strain: Low Risk  (11/09/2017)   Overall Financial Resource Strain (CARDIA)    Difficulty of Paying Living Expenses: Not hard at all  Food Insecurity: No Food Insecurity (11/09/2017)   Hunger Vital Sign    Worried About Running Out of Food in the Last Year: Never true    Ran Out of Food in the Last Year: Never true  Transportation Needs: No Transportation Needs (01/06/2018)   PRAPARE - Administrator, Civil Service (Medical): No    Lack of Transportation (Non-Medical): No  Recent Concern: Transportation Needs - Unmet Transportation Needs (11/09/2017)   PRAPARE - Transportation    Lack of Transportation (Medical): Yes    Lack of Transportation (Non-Medical): Yes  Physical Activity: Sufficiently Active (11/09/2017)   Exercise Vital Sign    Days of Exercise per Week: 3 days    Minutes of Exercise per Session: 50 min  Stress: No Stress Concern Present (11/09/2017)   [employer] of Occupational Health - Occupational Stress Questionnaire    Feeling of Stress : Not at all  Social Connections: Unknown (11/09/2017)   Social Connection and Isolation Panel    Frequency of  Communication with Friends and Family: More than three times a week    Frequency of Social Gatherings with Friends and Family: Once a week    Attends Religious Services: Never    Database Administrator or Organizations: No    Attends Banker Meetings: Never    Marital Status: Not on file  Intimate Partner Violence: Not on file      Review of  Systems  Constitutional:  Negative for activity change, appetite change, chills, fatigue, fever and unexpected weight change.  HENT: Negative.  Negative for congestion, ear pain, rhinorrhea, sore throat and trouble swallowing.   Eyes: Negative.   Respiratory: Negative.  Negative for cough, chest tightness, shortness of breath and wheezing.   Cardiovascular: Negative.  Negative for chest pain.  Gastrointestinal: Negative.  Negative for abdominal pain, blood in stool, constipation, diarrhea, nausea and vomiting.  Endocrine: Negative.   Genitourinary: Negative.  Negative for difficulty urinating, dysuria, frequency, hematuria and urgency.  Musculoskeletal: Negative.  Negative for arthralgias, back pain, joint swelling, myalgias and neck pain.  Skin: Negative.  Negative for rash and wound.  Allergic/Immunologic: Negative.  Negative for immunocompromised state.  Neurological: Negative.  Negative for dizziness, seizures, numbness and headaches.  Hematological: Negative.   Psychiatric/Behavioral: Negative.  Negative for behavioral problems, self-injury and suicidal ideas. The patient is not nervous/anxious.     Vital Signs: BP 138/88 (BP Location: Left Arm, Patient Position: Sitting, Cuff Size: Normal) Comment: 134/96  Pulse 73   Temp 98 F (36.7 C)   Resp 16   Ht 5' (1.524 m)   Wt 251 lb (113.9 kg)   LMP 07/20/2024 (Approximate)   SpO2 96%   BMI 49.02 kg/m    Physical Exam Vitals reviewed.  Constitutional:      General: She is awake. She is not in acute distress.    Appearance: Normal appearance. She is well-developed and  well-groomed. She is obese. She is not ill-appearing or diaphoretic.  HENT:     Head: Normocephalic and atraumatic.     Right Ear: Tympanic membrane, ear canal and external ear normal.     Left Ear: Tympanic membrane, ear canal and external ear normal.     Nose: Nose normal. No congestion or rhinorrhea.     Mouth/Throat:     Lips: Pink.     Mouth: Mucous membranes are moist.     Pharynx: Oropharynx is clear. Uvula midline. No oropharyngeal exudate or posterior oropharyngeal erythema.  Eyes:     General: Lids are normal. Vision grossly intact. Gaze aligned appropriately. No scleral icterus.       Right eye: No discharge.        Left eye: No discharge.     Extraocular Movements: Extraocular movements intact.     Conjunctiva/sclera: Conjunctivae normal.     Pupils: Pupils are equal, round, and reactive to light.     Funduscopic exam:    Right eye: Red reflex present.        Left eye: Red reflex present. Neck:     Thyroid : No thyromegaly.     Vascular: No carotid bruit or JVD.     Trachea: Trachea and phonation normal. No tracheal deviation.  Cardiovascular:     Rate and Rhythm: Normal rate and regular rhythm.     Pulses: Normal pulses.     Heart sounds: Normal heart sounds, S1 normal and S2 normal. No murmur heard.    No friction rub. No gallop.  Pulmonary:     Effort: Pulmonary effort is normal. No accessory muscle usage or respiratory distress.     Breath sounds: Normal breath sounds and air entry. No stridor. No wheezing or rales.  Chest:     Chest wall: No tenderness.     Comments: Declined clinical breast exam, will get first annual screening mammogram next year.  Abdominal:     General: Bowel sounds are normal. There is no distension.  Palpations: Abdomen is soft. There is no shifting dullness, fluid wave, mass or pulsatile mass.     Tenderness: There is no abdominal tenderness. There is no guarding or rebound.     Hernia: There is no hernia in the left inguinal area or  right inguinal area.  Genitourinary:    General: Normal vulva.     Exam position: Lithotomy position.     Labia:        Right: No rash, tenderness, lesion or injury.        Left: No rash, tenderness, lesion or injury.      Urethra: No prolapse, urethral swelling or urethral lesion.     Vagina: Normal. No signs of injury and foreign body. No vaginal discharge, erythema, tenderness, bleeding, lesions or prolapsed vaginal walls.     Cervix: No cervical motion tenderness, discharge, friability, lesion, erythema, cervical bleeding or eversion.     Uterus: Normal. Not deviated, not fixed and no uterine prolapse.      Adnexa: Right adnexa normal and left adnexa normal.     Rectum: No tenderness, anal fissure or external hemorrhoid. Normal anal tone.  Musculoskeletal:        General: No tenderness or deformity. Normal range of motion.     Cervical back: Normal range of motion and neck supple.     Right lower leg: No edema.     Left lower leg: No edema.  Lymphadenopathy:     Cervical: No cervical adenopathy.     Lower Body: No right inguinal adenopathy. No left inguinal adenopathy.  Skin:    General: Skin is warm and dry.     Capillary Refill: Capillary refill takes less than 2 seconds.     Coloration: Skin is not pale.     Findings: No erythema or rash.  Neurological:     Mental Status: She is alert and oriented to person, place, and time.     Cranial Nerves: No cranial nerve deficit.     Motor: No abnormal muscle tone.     Coordination: Coordination normal.     Gait: Gait normal.     Deep Tendon Reflexes: Reflexes are normal and symmetric.  Psychiatric:        Mood and Affect: Mood and affect normal.        Behavior: Behavior normal. Behavior is cooperative.        Thought Content: Thought content normal.        Judgment: Judgment normal.        Assessment/Plan: 1. Encounter for routine adult health examination with abnormal findings (Primary) Age-appropriate preventive  screenings and vaccinations discussed, annual physical exam completed. Routine labs for health maintenance ordered, see below. PHM updated.   - CBC with Differential/Platelet - CMP14+EGFR - Lipid Profile - ANA Direct w/Reflex if Positive - Rheumatoid Factor - Sed Rate (ESR) - C-reactive protein - HLA-B27 antigen - cetirizine  (ZYRTEC ) 10 MG tablet; Take 1 tablet (10 mg total) by mouth daily as needed for allergies.  Dispense: 90 tablet; Refill: 1 - Vitamin D  (25 hydroxy) - B12 and Folate Panel - Iron, TIBC and Ferritin Panel - ketoconazole  (NIZORAL ) 2 % shampoo; Twice a week, lather on scalp, leave on 5-8 minutes, rinse out  Dispense: 120 mL; Refill: 6  2. Hemoglobin H disease Routine labs ordered  - CBC with Differential/Platelet - CMP14+EGFR - Lipid Profile - ANA Direct w/Reflex if Positive - Rheumatoid Factor - Sed Rate (ESR) - C-reactive protein - HLA-B27 antigen - Vitamin D  (25  hydroxy) - B12 and Folate Panel - Iron, TIBC and Ferritin Panel  3. Mixed hyperlipidemia Routine labs ordered  - CBC with Differential/Platelet - CMP14+EGFR - Lipid Profile  4. Psoriasis Routine labs ordered  - ANA Direct w/Reflex if Positive - Rheumatoid Factor - Sed Rate (ESR) - C-reactive protein - HLA-B27 antigen  5. Fatty liver Routine and additional labs ordered  - CBC with Differential/Platelet - CMP14+EGFR - Lipid Profile - ANA Direct w/Reflex if Positive - Rheumatoid Factor - Sed Rate (ESR) - C-reactive protein - HLA-B27 antigen - Vitamin D  (25 hydroxy) - B12 and Folate Panel - Iron, TIBC and Ferritin Panel  6. Gastroesophageal reflux disease without esophagitis Continue famotidine  as prescribed.  - famotidine  (PEPCID ) 20 MG tablet; Take 1 tablet (20 mg total) by mouth 2 (two) times daily.  Dispense: 180 tablet; Refill: 1  7. B12 deficiency Routine labs ordered  - CBC with Differential/Platelet - B12 and Folate Panel - Iron, TIBC and Ferritin Panel  8. Vitamin D   deficiency Routine lab ordered  - Vitamin D  (25 hydroxy)  9. Morbid obesity (HCC) Discussed GLP1 injections, cannot get approved for fatty liver but will be able to get approved for obesity once the medication is put back on the formulary for next year.   10. History of stroke Routine labs ordered  - CBC with Differential/Platelet - CMP14+EGFR - Lipid Profile  11. Encounter for screening mammogram for malignant neoplasm of breast Routine mammogram ordered  - MM 3D SCREENING MAMMOGRAM BILATERAL BREAST; Future     General Counseling: Tyaisha verbalizes understanding of the findings of todays visit and agrees with plan of treatment. I have discussed any further diagnostic evaluation that may be needed or ordered today. We also reviewed her medications today. she has been encouraged to call the office with any questions or concerns that should arise related to todays visit.    Orders Placed This Encounter  Procedures   MM 3D SCREENING MAMMOGRAM BILATERAL BREAST   CBC with Differential/Platelet   CMP14+EGFR   Lipid Profile   ANA Direct w/Reflex if Positive   Rheumatoid Factor   Sed Rate (ESR)   C-reactive protein   HLA-B27 antigen   Vitamin D  (25 hydroxy)   B12 and Folate Panel   Iron, TIBC and Ferritin Panel    Meds ordered this encounter  Medications   famotidine  (PEPCID ) 20 MG tablet    Sig: Take 1 tablet (20 mg total) by mouth 2 (two) times daily.    Dispense:  180 tablet    Refill:  1   cetirizine  (ZYRTEC ) 10 MG tablet    Sig: Take 1 tablet (10 mg total) by mouth daily as needed for allergies.    Dispense:  90 tablet    Refill:  1   ketoconazole  (NIZORAL ) 2 % shampoo    Sig: Twice a week, lather on scalp, leave on 5-8 minutes, rinse out    Dispense:  120 mL    Refill:  6    Please fill new script today and keep For future refills    Return in about 3 weeks (around 08/30/2024) for F/U, PAP ONLY, Labs, Dawsyn Zurn PCP.   Total time spent:30 Minutes Time spent  includes review of chart, medications, test results, and follow up plan with the patient.   [place] Controlled Substance Database was reviewed by me.  This patient was seen by Mardy Maxin, FNP-C in collaboration with Dr. Sigrid Bathe as a part of collaborative care agreement.  Meryem Haertel R. Aissata Wilmore,  MSN, FNP-C Internal medicine

## 2024-08-29 LAB — CBC WITH DIFFERENTIAL/PLATELET
Basophils Absolute: 0.1 x10E3/uL (ref 0.0–0.2)
Basos: 1 %
EOS (ABSOLUTE): 0.6 x10E3/uL — ABNORMAL HIGH (ref 0.0–0.4)
Eos: 6 %
Hematocrit: 37.5 % (ref 34.0–46.6)
Hemoglobin: 10.8 g/dL — ABNORMAL LOW (ref 11.1–15.9)
Immature Grans (Abs): 0.1 x10E3/uL (ref 0.0–0.1)
Immature Granulocytes: 1 %
Lymphocytes Absolute: 3 x10E3/uL (ref 0.7–3.1)
Lymphs: 31 %
MCH: 16.3 pg — ABNORMAL LOW (ref 26.6–33.0)
MCHC: 28.8 g/dL — ABNORMAL LOW (ref 31.5–35.7)
MCV: 57 fL — ABNORMAL LOW (ref 79–97)
Monocytes Absolute: 0.6 x10E3/uL (ref 0.1–0.9)
Monocytes: 6 %
Neutrophils Absolute: 5.6 x10E3/uL (ref 1.4–7.0)
Neutrophils: 55 %
Platelets: 341 x10E3/uL (ref 150–450)
RBC: 6.62 x10E6/uL — ABNORMAL HIGH (ref 3.77–5.28)
RDW: 23.4 % — ABNORMAL HIGH (ref 11.7–15.4)
WBC: 9.9 x10E3/uL (ref 3.4–10.8)

## 2024-08-29 LAB — IRON,TIBC AND FERRITIN PANEL
Ferritin: 369 ng/mL — ABNORMAL HIGH (ref 15–150)
Iron Saturation: 26 % (ref 15–55)
Iron: 93 ug/dL (ref 27–159)
Total Iron Binding Capacity: 354 ug/dL (ref 250–450)
UIBC: 261 ug/dL (ref 131–425)

## 2024-08-29 LAB — LIPID PANEL
Chol/HDL Ratio: 2.7 ratio (ref 0.0–4.4)
Cholesterol, Total: 170 mg/dL (ref 100–199)
HDL: 63 mg/dL
LDL Chol Calc (NIH): 75 mg/dL (ref 0–99)
Triglycerides: 194 mg/dL — ABNORMAL HIGH (ref 0–149)
VLDL Cholesterol Cal: 32 mg/dL (ref 5–40)

## 2024-08-29 LAB — CMP14+EGFR
ALT: 145 IU/L — ABNORMAL HIGH (ref 0–32)
AST: 94 IU/L — ABNORMAL HIGH (ref 0–40)
Albumin: 4.1 g/dL (ref 3.9–4.9)
Alkaline Phosphatase: 94 IU/L (ref 41–116)
BUN/Creatinine Ratio: 20 (ref 9–23)
BUN: 12 mg/dL (ref 6–24)
Bilirubin Total: 0.7 mg/dL (ref 0.0–1.2)
CO2: 18 mmol/L — ABNORMAL LOW (ref 20–29)
Calcium: 9.5 mg/dL (ref 8.7–10.2)
Chloride: 102 mmol/L (ref 96–106)
Creatinine, Ser: 0.61 mg/dL (ref 0.57–1.00)
Globulin, Total: 2.7 g/dL (ref 1.5–4.5)
Glucose: 109 mg/dL — ABNORMAL HIGH (ref 70–99)
Potassium: 4.1 mmol/L (ref 3.5–5.2)
Sodium: 139 mmol/L (ref 134–144)
Total Protein: 6.8 g/dL (ref 6.0–8.5)
eGFR: 114 mL/min/1.73 (ref >59)

## 2024-08-29 LAB — B12 AND FOLATE PANEL
Folate: 4.1 ng/mL (ref >3.0)
Vitamin B-12: 587 pg/mL (ref 232–1245)

## 2024-08-29 LAB — C-REACTIVE PROTEIN: CRP: 5 mg/L (ref 0–10)

## 2024-08-29 LAB — RHEUMATOID FACTOR: Rheumatoid fact SerPl-aCnc: 11.9 [IU]/mL

## 2024-08-29 LAB — ANA W/REFLEX IF POSITIVE: Anti Nuclear Antibody (ANA): NEGATIVE

## 2024-08-29 LAB — SEDIMENTATION RATE: Sed Rate: 27 mm/h (ref 0–32)

## 2024-08-29 LAB — VITAMIN D 25 HYDROXY (VIT D DEFICIENCY, FRACTURES): Vit D, 25-Hydroxy: 14.5 ng/mL — ABNORMAL LOW (ref 30.0–100.0)

## 2024-08-29 LAB — HLA-B27 ANTIGEN

## 2024-08-30 ENCOUNTER — Telehealth: Payer: Self-pay | Admitting: Nurse Practitioner

## 2024-08-30 ENCOUNTER — Ambulatory Visit: Admitting: Nurse Practitioner

## 2024-08-30 ENCOUNTER — Encounter: Payer: Self-pay | Admitting: Nurse Practitioner

## 2024-08-30 VITALS — BP 128/80 | HR 87 | Temp 96.2°F | Resp 16 | Ht 60.0 in | Wt 253.4 lb

## 2024-08-30 DIAGNOSIS — I517 Cardiomegaly: Secondary | ICD-10-CM

## 2024-08-30 DIAGNOSIS — E559 Vitamin D deficiency, unspecified: Secondary | ICD-10-CM

## 2024-08-30 DIAGNOSIS — D560 Alpha thalassemia: Secondary | ICD-10-CM

## 2024-08-30 DIAGNOSIS — I5189 Other ill-defined heart diseases: Secondary | ICD-10-CM

## 2024-08-30 DIAGNOSIS — K760 Fatty (change of) liver, not elsewhere classified: Secondary | ICD-10-CM

## 2024-08-30 MED ORDER — CLOBETASOL PROPIONATE 0.05 % EX SHAM: MEDICATED_SHAMPOO | CUTANEOUS | 3 refills | Status: AC

## 2024-08-30 MED ORDER — FOLIC ACID 1 MG PO TABS: 1.0000 mg | ORAL_TABLET | Freq: Every day | ORAL | 1 refills | Status: AC

## 2024-08-30 MED ORDER — VITAMIN D (ERGOCALCIFEROL) 1.25 MG (50000 UNIT) PO CAPS: 50000.0000 [IU] | ORAL_CAPSULE | ORAL | 1 refills | Status: AC

## 2024-08-30 NOTE — Progress Notes (Signed)
 [facility] [address] [place], [place] [address]  Internal MEDICINE  Office Visit Note  Patient Name: Natasha Moreno  [id number]  [id number]  Date of Service: 08/30/2024  Chief Complaint  Patient presents with   Hyperlipidemia   Hypertension   Follow-up    HPI Rey presents for a follow-up visit for lab results, and hemoglobin H disease management.  Hemoglobin H -- reviewed iron panel, CBC. She has not had a baseline ultrasound of her spleen. She has a history of hepatomegaly and NAFLD. She has not had a fibroscan Elevated ferritin -- mildly elevated, serum iron was normal.  Autoimmune labs are normal Low vitamin D  -- needs to stay on weekly supplement Obesity --wants to try zepbound . Wegovy  caused severe heartburn. Not covered by her insurance right now, will check in January.  Had echo 3 years ago -- left atrial enlargement, EF >60%, diastolic dysfunction Dexa scan was done in 2023 and was normal Elevated triglycerides 194 On her cycle right now, will try to do her pap smear in January.    Current Medication: Outpatient Encounter Medications as of 08/30/2024  Medication Sig   albuterol  (VENTOLIN  HFA) 108 (90 Base) MCG/ACT inhaler TAKE 2 PUFFS BY MOUTH EVERY 6 HOURS AS NEEDED FOR WHEEZE OR SHORTNESS OF BREATH   augmented betamethasone  dipropionate (DIPROLENE ) 0.05 % ointment Apply topically 2 (two) times daily. (Patient not taking: Reported on 08/30/2024)   cetirizine  (ZYRTEC ) 10 MG tablet Take 1 tablet (10 mg total) by mouth daily as needed for allergies.   Clobetasol  Propionate 0.05 % shampoo 3-4 days per week lather on a dry scalp, leave on 15-30 minutes, lather and rinse ou   famotidine  (PEPCID ) 20 MG tablet Take 1 tablet (20 mg total) by mouth 2 (two) times daily.   folic acid  (FOLVITE ) 1 MG tablet Take 1 tablet (1 mg total) by mouth daily.   hydrochlorothiazide  (HYDRODIURIL ) 12.5 MG tablet TAKE 1 TABLET BY MOUTH EVERY DAY   ketoconazole  (NIZORAL ) 2 %  shampoo Twice a week, lather on scalp, leave on 5-8 minutes, rinse out   rosuvastatin  (CRESTOR ) 5 MG tablet TAKE ONE TAB BY MOUTH 3 X A WEEK   Vitamin D , Ergocalciferol , (DRISDOL ) 1.25 MG (50000 UNIT) CAPS capsule Take 1 capsule (50,000 Units total) by mouth every 7 (seven) days.   [DISCONTINUED] Clobetasol  Propionate 0.05 % shampoo 3-4 days per week lather on a dry scalp, leave on 15-30 minutes, lather and rinse ou   [DISCONTINUED] folic acid  (FOLVITE ) 1 MG tablet TAKE 1 TABLET BY MOUTH EVERY DAY   [DISCONTINUED] Vitamin D , Ergocalciferol , (DRISDOL ) 1.25 MG (50000 UNIT) CAPS capsule Take 1 capsule (50,000 Units total) by mouth every 7 (seven) days. (Patient not taking: Reported on 08/30/2024)   No facility-administered encounter medications on file as of 08/30/2024.    Surgical History: Past Surgical History:  Procedure Laterality Date   CHOLECYSTECTOMY     TEE WITHOUT CARDIOVERSION N/A 11/11/2017   Procedure: TRANSESOPHAGEAL ECHOCARDIOGRAM (TEE);  Surgeon: Bosie Vinie LABOR, MD;  Location: [facility] ORS;  Service: Cardiovascular;  Laterality: N/A;   TONSILLECTOMY AND ADENOIDECTOMY Bilateral 11/06/2021   Procedure: TONSILLECTOMY;  Surgeon: Blair Mt, MD;  Location: [facility] ORS;  Service: ENT;  Laterality: Bilateral;   TUBAL LIGATION     WISDOM TOOTH EXTRACTION      Medical History: Past Medical History:  Diagnosis Date   Anemia    Hemoglobin E-A disorder    Hyperlipidemia    Hypertension    Migraine  Morbid obesity (HCC)    Sleep apnea    uses CPAP   Stroke [facility])     Family History: Family History  Problem Relation Age of Onset   Kidney failure Father    Heart attack Father        open heart surgery   Hypertension Father    Diabetes Father    Gout Father    Cataracts Father     Social History   Socioeconomic History   Marital status: Single    Spouse name: Not on file   Number of children: 2   Years of education: Not on file   Highest education level: High school  graduate  Occupational History   Not on file  Tobacco Use   Smoking status: Former    Current packs/day: 0.00    Types: Cigarettes    Quit date: 11/09/2017    Years since quitting: 6.8   Smokeless tobacco: Never  Vaping Use   Vaping status: Former  Substance and Sexual Activity   Alcohol use: Yes    Comment: every other weekend, not much    Drug use: No   Sexual activity: Yes    Birth control/protection: Condom  Other Topics Concern   Not on file  Social History Narrative   Lives at home with her parents, brother and her children   Right handed   Drinks rare caffeine    Social Drivers of Corporate Investment Banker Strain: Low Risk  (11/09/2017)   Overall Financial Resource Strain (CARDIA)    Difficulty of Paying Living Expenses: Not hard at all  Food Insecurity: No Food Insecurity (11/09/2017)   Hunger Vital Sign    Worried About Running Out of Food in the Last Year: Never true    Ran Out of Food in the Last Year: Never true  Transportation Needs: No Transportation Needs (01/06/2018)   PRAPARE - Administrator, Civil Service (Medical): No    Lack of Transportation (Non-Medical): No  Recent Concern: Transportation Needs - Unmet Transportation Needs (11/09/2017)   PRAPARE - Transportation    Lack of Transportation (Medical): Yes    Lack of Transportation (Non-Medical): Yes  Physical Activity: Sufficiently Active (11/09/2017)   Exercise Vital Sign    Days of Exercise per Week: 3 days    Minutes of Exercise per Session: 50 min  Stress: No Stress Concern Present (11/09/2017)   [employer] of Occupational Health - Occupational Stress Questionnaire    Feeling of Stress : Not at all  Social Connections: Unknown (11/09/2017)   Social Connection and Isolation Panel    Frequency of Communication with Friends and Family: More than three times a week    Frequency of Social Gatherings with Friends and Family: Once a week    Attends Religious Services: Never    Automotive Engineer or Organizations: No    Attends Engineer, Structural: Never    Marital Status: Not on file  Intimate Partner Violence: Not on file      Review of Systems  Constitutional:  Positive for appetite change and unexpected weight change. Negative for fatigue and fever.  HENT: Negative.  Negative for congestion, mouth sores and postnasal drip.   Respiratory: Negative.  Negative for apnea, cough, chest tightness, shortness of breath and wheezing.   Cardiovascular:  Negative for chest pain and palpitations.  Genitourinary:  Negative for flank pain.  Psychiatric/Behavioral: Negative.  Negative for self-injury, sleep disturbance and suicidal ideas.  Vital Signs: BP 128/80 Comment: 136/98  Pulse 87   Temp (!) 96.2 F (35.7 C)   Resp 16   Ht 5' (1.524 m)   Wt 253 lb 6.4 oz (114.9 kg)   SpO2 96%   BMI 49.49 kg/m    Physical Exam Vitals reviewed.  Constitutional:      General: She is not in acute distress.    Appearance: Normal appearance. She is obese. She is not ill-appearing.  HENT:     Head: Normocephalic and atraumatic.  Eyes:     Pupils: Pupils are equal, round, and reactive to light.  Cardiovascular:     Rate and Rhythm: Normal rate and regular rhythm.  Pulmonary:     Effort: Pulmonary effort is normal. No respiratory distress.  Neurological:     Mental Status: She is alert and oriented to person, place, and time.  Psychiatric:        Mood and Affect: Mood normal.        Behavior: Behavior normal.        Assessment/Plan: 1. Hemoglobin H disease (Primary) --due for repeat echocardiogram (last done in 2022) --baseline ultrasound of spleen ordered.  --fibroscan of liver ordered --will continue lab draw every 6 months to monitor status including CBC, renal function, LFTs, iron panel, ferritin, and retic count.  --continue folic acid  1 mg daily.  --repeat dexa scan in 2-3 years.   - ECHOCARDIOGRAM COMPLETE; Future - US  SPLEEN (ABDOMEN  LIMITED); Future - folic acid  (FOLVITE ) 1 MG tablet; Take 1 tablet (1 mg total) by mouth daily.  Dispense: 90 tablet; Refill: 1 - US  ELASTOGRAPHY LIVER; Future  2. Fatty liver Fibroscan of liver ordered  - US  ELASTOGRAPHY LIVER; Future  3. Left atrial dilation Echocardiogram ordered.  - ECHOCARDIOGRAM COMPLETE; Future  4. Diastolic dysfunction Echocardiogram ordered  - ECHOCARDIOGRAM COMPLETE; Future  5. Vitamin D  deficiency Continue weekly vitamin D  supplement as prescribed.  - Vitamin D , Ergocalciferol , (DRISDOL ) 1.25 MG (50000 UNIT) CAPS capsule; Take 1 capsule (50,000 Units total) by mouth every 7 (seven) days.  Dispense: 12 capsule; Refill: 1   General Counseling: Zerah verbalizes understanding of the findings of todays visit and agrees with plan of treatment. I have discussed any further diagnostic evaluation that may be needed or ordered today. We also reviewed her medications today. she has been encouraged to call the office with any questions or concerns that should arise related to todays visit.    Orders Placed This Encounter  Procedures   US  SPLEEN (ABDOMEN LIMITED)   US  ELASTOGRAPHY LIVER   ECHOCARDIOGRAM COMPLETE    Meds ordered this encounter  Medications   Clobetasol  Propionate 0.05 % shampoo    Sig: 3-4 days per week lather on a dry scalp, leave on 15-30 minutes, lather and rinse ou    Dispense:  118 mL    Refill:  3   Vitamin D , Ergocalciferol , (DRISDOL ) 1.25 MG (50000 UNIT) CAPS capsule    Sig: Take 1 capsule (50,000 Units total) by mouth every 7 (seven) days.    Dispense:  12 capsule    Refill:  1   folic acid  (FOLVITE ) 1 MG tablet    Sig: Take 1 tablet (1 mg total) by mouth daily.    Dispense:  90 tablet    Refill:  1    Return in about 1 month (around 09/30/2024) for F/U, Mylie Mccurley PCP ultrasound results and zepbound  prescription also will try for pap smear again .   Total time spent:30 Minutes  Time spent includes review of chart, medications,  test results, and follow up plan with the patient.   [place] Controlled Substance Database was reviewed by me.  This patient was seen by Mardy Maxin, FNP-C in collaboration with Dr. Sigrid Bathe as a part of collaborative care agreement.   Chermaine Schnyder R. Maxin, MSN, FNP-C Internal medicine

## 2024-08-30 NOTE — Telephone Encounter (Signed)
Lvm notifying patient of echo appointment date, arrival time, location-Natasha Moreno

## 2024-09-12 ENCOUNTER — Ambulatory Visit: Admission: RE | Admit: 2024-09-12 | Source: Ambulatory Visit

## 2024-09-13 ENCOUNTER — Ambulatory Visit
Admission: RE | Admit: 2024-09-13 | Discharge: 2024-09-13 | Disposition: A | Discharge status: Home / Self Care | Source: Ambulatory Visit | Attending: Nurse Practitioner | Admitting: Nurse Practitioner

## 2024-09-13 DIAGNOSIS — Z1231 Encounter for screening mammogram for malignant neoplasm of breast: Secondary | ICD-10-CM

## 2024-09-13 DIAGNOSIS — E538 Deficiency of other specified B group vitamins: Secondary | ICD-10-CM

## 2024-09-13 DIAGNOSIS — D560 Alpha thalassemia: Secondary | ICD-10-CM

## 2024-09-13 DIAGNOSIS — Z0001 Encounter for general adult medical examination with abnormal findings: Secondary | ICD-10-CM

## 2024-09-13 DIAGNOSIS — E559 Vitamin D deficiency, unspecified: Secondary | ICD-10-CM

## 2024-09-13 DIAGNOSIS — E782 Mixed hyperlipidemia: Secondary | ICD-10-CM

## 2024-09-13 DIAGNOSIS — L409 Psoriasis, unspecified: Secondary | ICD-10-CM

## 2024-09-13 DIAGNOSIS — Z8673 Personal history of transient ischemic attack (TIA), and cerebral infarction without residual deficits: Secondary | ICD-10-CM

## 2024-09-13 DIAGNOSIS — K760 Fatty (change of) liver, not elsewhere classified: Secondary | ICD-10-CM

## 2024-09-16 ENCOUNTER — Encounter: Payer: Self-pay | Admitting: Nurse Practitioner

## 2024-09-16 DIAGNOSIS — L409 Psoriasis, unspecified: Secondary | ICD-10-CM | POA: Insufficient documentation

## 2024-09-16 DIAGNOSIS — K219 Gastro-esophageal reflux disease without esophagitis: Secondary | ICD-10-CM | POA: Insufficient documentation

## 2024-10-04 ENCOUNTER — Ambulatory Visit
Admission: RE | Admit: 2024-10-04 | Discharge: 2024-10-04 | Disposition: A | Source: Ambulatory Visit | Attending: Nurse Practitioner

## 2024-10-04 DIAGNOSIS — D560 Alpha thalassemia: Secondary | ICD-10-CM

## 2024-10-04 DIAGNOSIS — K760 Fatty (change of) liver, not elsewhere classified: Secondary | ICD-10-CM

## 2024-10-05 ENCOUNTER — Ambulatory Visit: Admitting: Nurse Practitioner

## 2024-10-06 ENCOUNTER — Inpatient Hospital Stay: Admission: RE | Admit: 2024-10-06 | Source: Ambulatory Visit

## 2024-10-24 ENCOUNTER — Other Ambulatory Visit: Payer: Self-pay

## 2024-10-24 DIAGNOSIS — J069 Acute upper respiratory infection, unspecified: Secondary | ICD-10-CM

## 2024-10-24 MED ORDER — ALBUTEROL SULFATE HFA 108 (90 BASE) MCG/ACT IN AERS: INHALATION_SPRAY | RESPIRATORY_TRACT | 1 refills | Status: AC

## 2024-11-03 ENCOUNTER — Ambulatory Visit: Admitting: Nurse Practitioner

## 2025-08-10 ENCOUNTER — Encounter: Admitting: Nurse Practitioner
